# Patient Record
Sex: Female | Born: 1960
Health system: Southern US, Community
[De-identification: ages and names within clinical notes are randomized; demographics above are authoritative.]

## PROBLEM LIST (undated history)

## (undated) DIAGNOSIS — F319 Bipolar disorder, unspecified: Secondary | ICD-10-CM

## (undated) DIAGNOSIS — K209 Esophagitis, unspecified without bleeding: Secondary | ICD-10-CM

## (undated) DIAGNOSIS — B009 Herpesviral infection, unspecified: Secondary | ICD-10-CM

## (undated) DIAGNOSIS — G4700 Insomnia, unspecified: Secondary | ICD-10-CM

## (undated) DIAGNOSIS — K222 Esophageal obstruction: Secondary | ICD-10-CM

## (undated) DIAGNOSIS — K219 Gastro-esophageal reflux disease without esophagitis: Secondary | ICD-10-CM

## (undated) DIAGNOSIS — F308 Other manic episodes: Secondary | ICD-10-CM

## (undated) DIAGNOSIS — R7303 Prediabetes: Secondary | ICD-10-CM

## (undated) DIAGNOSIS — G4733 Obstructive sleep apnea (adult) (pediatric): Secondary | ICD-10-CM

## (undated) DIAGNOSIS — I700 Atherosclerosis of aorta: Secondary | ICD-10-CM

## (undated) DIAGNOSIS — F32A Depression, unspecified: Secondary | ICD-10-CM

## (undated) DIAGNOSIS — M810 Age-related osteoporosis without current pathological fracture: Secondary | ICD-10-CM

## (undated) DIAGNOSIS — F411 Generalized anxiety disorder: Secondary | ICD-10-CM

## (undated) DIAGNOSIS — R251 Tremor, unspecified: Secondary | ICD-10-CM

## (undated) DIAGNOSIS — R7611 Nonspecific reaction to tuberculin skin test without active tuberculosis: Secondary | ICD-10-CM

## (undated) DIAGNOSIS — E785 Hyperlipidemia, unspecified: Secondary | ICD-10-CM

## (undated) HISTORY — DX: Esophageal obstruction: K22.2

## (undated) HISTORY — DX: Hyperlipidemia, unspecified: E78.5

## (undated) HISTORY — PX: ROBOTIC ASSISTED LAPAROSCOPIC HYSTERECTOMY AND SALPINGECTOMY: SHX6379

## (undated) HISTORY — DX: Generalized anxiety disorder: F41.1

## (undated) HISTORY — DX: Bipolar disorder, unspecified: F31.9

## (undated) HISTORY — DX: Obstructive sleep apnea (adult) (pediatric): G47.33

## (undated) HISTORY — PX: ABDOMINAL HYSTERECTOMY: SHX81

## (undated) HISTORY — DX: Gastro-esophageal reflux disease without esophagitis: K21.9

## (undated) HISTORY — DX: Other manic episodes: F30.8

## (undated) HISTORY — PX: CATARACT EXTRACTION, BILATERAL: SHX1313

## (undated) HISTORY — DX: Nonspecific reaction to tuberculin skin test without active tuberculosis: R76.11

## (undated) HISTORY — DX: Herpesviral infection, unspecified: B00.9

## (undated) HISTORY — DX: Depression, unspecified: F32.A

## (undated) HISTORY — DX: Prediabetes: R73.03

## (undated) HISTORY — DX: Atherosclerosis of aorta: I70.0

## (undated) HISTORY — DX: Insomnia, unspecified: G47.00

## (undated) HISTORY — DX: Tremor, unspecified: R25.1

## (undated) HISTORY — DX: Age-related osteoporosis without current pathological fracture: M81.0

## (undated) HISTORY — DX: Esophagitis, unspecified without bleeding: K20.90

---

## 1998-08-25 ENCOUNTER — Emergency Department (HOSPITAL_COMMUNITY): Admission: EM | Admit: 1998-08-25 | Discharge: 1998-08-25 | Payer: Self-pay | Admitting: Emergency Medicine

## 1999-12-10 ENCOUNTER — Other Ambulatory Visit: Admission: RE | Admit: 1999-12-10 | Discharge: 1999-12-10 | Payer: Self-pay | Admitting: *Deleted

## 2000-01-14 ENCOUNTER — Encounter (INDEPENDENT_AMBULATORY_CARE_PROVIDER_SITE_OTHER): Payer: Self-pay

## 2000-01-14 ENCOUNTER — Ambulatory Visit (HOSPITAL_COMMUNITY): Admission: RE | Admit: 2000-01-14 | Discharge: 2000-01-14 | Payer: Self-pay | Admitting: Obstetrics and Gynecology

## 2000-01-15 ENCOUNTER — Observation Stay (HOSPITAL_COMMUNITY): Admission: AD | Admit: 2000-01-15 | Discharge: 2000-01-16 | Payer: Self-pay | Admitting: Obstetrics & Gynecology

## 2001-07-03 ENCOUNTER — Other Ambulatory Visit: Admission: RE | Admit: 2001-07-03 | Discharge: 2001-07-03 | Payer: Self-pay | Admitting: Obstetrics and Gynecology

## 2001-10-04 DIAGNOSIS — G4730 Sleep apnea, unspecified: Secondary | ICD-10-CM | POA: Insufficient documentation

## 2001-10-04 DIAGNOSIS — G4733 Obstructive sleep apnea (adult) (pediatric): Secondary | ICD-10-CM | POA: Insufficient documentation

## 2002-01-28 ENCOUNTER — Other Ambulatory Visit: Admission: RE | Admit: 2002-01-28 | Discharge: 2002-01-28 | Payer: Self-pay | Admitting: Obstetrics and Gynecology

## 2002-06-10 ENCOUNTER — Other Ambulatory Visit: Admission: RE | Admit: 2002-06-10 | Discharge: 2002-06-10 | Payer: Self-pay | Admitting: Obstetrics and Gynecology

## 2002-10-30 ENCOUNTER — Other Ambulatory Visit: Admission: RE | Admit: 2002-10-30 | Discharge: 2002-10-30 | Payer: Self-pay | Admitting: Obstetrics and Gynecology

## 2004-02-05 ENCOUNTER — Other Ambulatory Visit: Admission: RE | Admit: 2004-02-05 | Discharge: 2004-02-05 | Payer: Self-pay | Admitting: Family Medicine

## 2004-04-28 ENCOUNTER — Ambulatory Visit (HOSPITAL_COMMUNITY): Admission: RE | Admit: 2004-04-28 | Discharge: 2004-04-28 | Payer: Self-pay | Admitting: Obstetrics & Gynecology

## 2005-09-29 ENCOUNTER — Other Ambulatory Visit: Admission: RE | Admit: 2005-09-29 | Discharge: 2005-09-29 | Payer: Self-pay | Admitting: Family Medicine

## 2006-04-07 ENCOUNTER — Encounter (INDEPENDENT_AMBULATORY_CARE_PROVIDER_SITE_OTHER): Payer: Self-pay | Admitting: Specialist

## 2006-04-07 ENCOUNTER — Ambulatory Visit (HOSPITAL_COMMUNITY): Admission: RE | Admit: 2006-04-07 | Discharge: 2006-04-08 | Payer: Self-pay | Admitting: Obstetrics & Gynecology

## 2008-07-28 ENCOUNTER — Other Ambulatory Visit: Admission: RE | Admit: 2008-07-28 | Discharge: 2008-07-28 | Payer: Self-pay | Admitting: Family Medicine

## 2009-07-29 ENCOUNTER — Other Ambulatory Visit: Admission: RE | Admit: 2009-07-29 | Discharge: 2009-07-29 | Payer: Self-pay | Admitting: Family Medicine

## 2010-08-03 ENCOUNTER — Other Ambulatory Visit: Admission: RE | Admit: 2010-08-03 | Discharge: 2010-08-03 | Payer: Self-pay | Admitting: Family Medicine

## 2010-10-03 ENCOUNTER — Emergency Department (HOSPITAL_BASED_OUTPATIENT_CLINIC_OR_DEPARTMENT_OTHER)
Admission: EM | Admit: 2010-10-03 | Discharge: 2010-10-03 | Payer: Self-pay | Source: Home / Self Care | Admitting: Emergency Medicine

## 2011-01-04 LAB — BASIC METABOLIC PANEL
BUN: 15 mg/dL (ref 6–23)
CO2: 28 mEq/L (ref 19–32)
Chloride: 103 mEq/L (ref 96–112)
Creatinine, Ser: 0.8 mg/dL (ref 0.4–1.2)
GFR calc Af Amer: >60 mL/min (ref >60)
Glucose, Bld: 97 mg/dL (ref 70–99)
Potassium: 4.1 mEq/L (ref 3.5–5.1)
Sodium: 144 mEq/L (ref 135–145)

## 2011-01-04 LAB — POCT CARDIAC MARKERS
CKMB, poc: <1 ng/mL — ABNORMAL LOW (ref 1.0–8.0)
CKMB, poc: <1 ng/mL — ABNORMAL LOW (ref 1.0–8.0)
Myoglobin, poc: 47.8 ng/mL (ref 12–200)
Troponin i, poc: <0.05 ng/mL (ref 0.00–0.09)
Troponin i, poc: <0.05 ng/mL (ref 0.00–0.09)

## 2011-01-04 LAB — CBC
MCH: 30.9 pg (ref 26.0–34.0)
MCHC: 34.1 g/dL (ref 30.0–36.0)
Platelets: 265 10*3/uL (ref 150–400)
RBC: 4.31 MIL/uL (ref 3.87–5.11)
RDW: 12.3 % (ref 11.5–15.5)

## 2011-01-04 LAB — DIFFERENTIAL
Basophils Relative: 1 % (ref 0–1)
Monocytes Relative: 7 % (ref 3–12)

## 2011-01-06 ENCOUNTER — Other Ambulatory Visit (HOSPITAL_BASED_OUTPATIENT_CLINIC_OR_DEPARTMENT_OTHER): Payer: Self-pay | Admitting: Gastroenterology

## 2011-01-06 DIAGNOSIS — R1013 Epigastric pain: Secondary | ICD-10-CM

## 2011-01-10 ENCOUNTER — Ambulatory Visit (HOSPITAL_BASED_OUTPATIENT_CLINIC_OR_DEPARTMENT_OTHER)
Admission: RE | Admit: 2011-01-10 | Discharge: 2011-01-10 | Disposition: A | Discharge status: Home / Self Care | Payer: BC Managed Care – PPO | Source: Ambulatory Visit | Attending: Gastroenterology | Admitting: Gastroenterology

## 2011-01-10 DIAGNOSIS — R079 Chest pain, unspecified: Secondary | ICD-10-CM | POA: Insufficient documentation

## 2011-01-10 DIAGNOSIS — R1013 Epigastric pain: Secondary | ICD-10-CM | POA: Insufficient documentation

## 2011-03-11 NOTE — Op Note (Signed)
NAME:  [name], [name]                     ACCOUNT NO.:  [id number]   MEDICAL RECORD NO.:  [id number]                   PATIENT TYPE:  AMB   LOCATION:  SDC                                  FACILITY:  [facility]   PHYSICIAN:  Genia Del, M.D.             DATE OF BIRTH:  10-30-1960   DATE OF PROCEDURE:  04/28/2004  DATE OF DISCHARGE:                                 OPERATIVE REPORT   PREOPERATIVE DIAGNOSIS:  Persistent CIN-I with large ectropion.   POSTOPERATIVE DIAGNOSIS:  Persistent CIN-I with large ectropion.   PROCEDURE:  Colposcopy plus laser CO2 of cervix.   SURGEON:  Genia Del, M.D.   ANESTHESIOLOGIST:  Burnett Corrente, M.D.   DESCRIPTION OF PROCEDURE:  Under general anesthesia with endotracheal  intubation, the patient is in lithotomy position.  We start with colposcopy.  The vulva is inspected.  No lesion is seen at that level. The speculum is  inserted in the vagina.  We look at the vaginal mucosa which is normal in  appearance, then we concentrate on the cervix.  Acetic acid is applied.  A  large ectropion is seen. Mild Acetowhite areas are seen at 9 and 12 o'clock  which grew response to the areas biopsied on the colposcopy May 2005 which  showed CIN-I.  We then proceed with the CO2 laser vaporization.  Wet towels  are applied on the suprapubic and perivulvar areas.  The speculum used is  galvanized.  We used the CO2 laser to vaporize the transitional zone  entirely including the old ectropion where the cervix appears normal on the  ectropion or on the transitional zone.  The department goes to about 3 mm.  We go deeper at 9 o'clock and 12 o'clock to 5 to 7 mm.  We used the maximum  intensity of 30, then lower it to 15 for hemostasis.  Good hemostasis is  attained.  Monsel's is added as well as silver nitrate to complete it.  All  instruments are then removed.  The estimated blood loss was minimal.  No  complications occurred and the patient was transferred  to the recovery room  in good condition.                                               Genia Del, M.D.    ML/MEDQ  D:  04/28/2004  T:  04/28/2004  Job:  [id number]

## 2011-03-11 NOTE — Op Note (Signed)
[facility]  Patient:    [name]                     MRN: [id number] Proc. Date: 01/14/00 Adm. Date:  [id number] Attending:  Morene Antu                           Operative Report  PREOPERATIVE DIAGNOSIS:       Menorrhagia, thickened endometrium.  POSTOPERATIVE DIAGNOSIS:      Menorrhagia, thickened endometrium.  Submucosal fibroids.  OPERATION:  SURGEON:                      Sherry A. Rosalio Macadamia, M.D.  ASSISTANT:  ANESTHESIA:                   General anesthesia.  ESTIMATED BLOOD LOSS:  INDICATIONS:                  This is a 50 year old, gravida 2, para 2-0-0-2, woman who has been having excessively heavy menstrual periods every month lasting five to six days.  Because of the excessive flow and an enlarged uterus on examination, the patient underwent ultrasound.  Ultrasound revealed a uterus with small fibroids  present with echogenic masses present within the endometrial cavity.  Because of this thickening, the patient was brought to the operating room for a D&C, hysteroscopy, with resectoscope.  FINDINGS:                     9 weeks size anteflexed uterus.  Adnexa without mass. Submucosal fibroids present.  DESCRIPTION OF PROCEDURE:     The patient was brought into the operating room and given adequate general anesthesia with an ______ tube.  The patient was placed n the dorsal lithotomy position.  Her perineum and vagina were washed with Betadine. The bladder was in-and-out catheterized.  Pelvic examination was performed. The patient was then draped in a sterile fashion.  Speculum was placed within the vagina.  Paracervical block was administered with 1% Nesocaine.  Anterior lip of the cervix was grasped with a single tooth tenaculum.  The cervix was sounded. The cervix was dilated with Pratt dilators to a #31.  Hysteroscope was easily introduced into the endometrial cavity.  Pictures were obtained.  Using  a right  angle double loupe resector at 190 watts of power, the endometrium was resected in sheets including removing the submucosal fibroids.  Once this was done circumferentially and adequate hemostasis was obtained, picture was obtained. ll instruments were then removed from the vagina.  The patient was taken out of the dorsal lithotomy position.  She was awakened.  She was removed from the operating table to a stretcher in stable condition.  Complications were none.  Estimated blood loss was less than 5 cc.  Sorbitol differential -100. DD:  01/14/00 TD:  01/14/00 Job: [id number]

## 2011-03-11 NOTE — Op Note (Signed)
[name], [name]           ACCOUNT NO.:  [id number]   MEDICAL RECORD NO.:  [id number]          PATIENT TYPE:  AMB   LOCATION:  DAY                          FACILITY:  [facility]   PHYSICIAN:  Genia Del, M.D.DATE OF BIRTH:  1961/02/03   DATE OF PROCEDURE:  04/07/2006  DATE OF DISCHARGE:                                 OPERATIVE REPORT   PREOPERATIVE DIAGNOSIS:  Symptomatic uterine myomas with menorrhagia.   POSTOPERATIVE DIAGNOSIS:  Symptomatic uterine myomas with menorrhagia.   INTERVENTION:  Laparoscopically-assisted vaginal hysterectomy plus bilateral  salpingo-oophorectomy assisted with da Vinci robot.   SURGEON:  Genia Del, M.D.   ASSISTANT:  Pershing Cox, M.D.   ANESTHESIOLOGIST:  Dr. Audie Box.   PROCEDURE:  Under general anesthesia with endotracheal intubation, the  patient is in lithotomy position for operative laparoscopy.  She is prepped  with Betadine on the abdominal, suprapubic, vulvar and vaginal areas.  The  bladder catheter is inserted and the patient is draped as usual.  A vaginal  exam is done under anesthesia.  We then put in place the RUMI with a co-  ring.  The co-ring is attached to the cervix with a Vicryl 0.  We then put  the port in place.  The measurement is done between the symphysis pubis and  the supraumbilical area.  The camera port is inserted at 20 cm from the  symphysis pubis.  Infiltration of Marcaine 0.25% plain at all incision sites  and incisions are made with scalpel.  We start with the Hasson, which is  inserted under direct vision.  We insert the camera at that site and a  pneumoperitoneum is created.  We then measure the site with all other ports  and they are put in place as usual with the two robotic arms at 8 cm,  slightly down from the camera, and the fourth arm on the left side and the  assistant arm up on the right between the camera and the second arm of the  robot.  We put the instruments in place, the  shear scissors on the right  robotic arm and a [place] on the left.  The tenaculum is put on the fourth  arm.  We are ready for robotic time.  We start on the left side.  We  successively cauterize and cut the left infundibulopelvic ligament, the left  round ligament, and follow close to the uterus down to the left uterine  artery.  The left ureter was well-identified before doing so and was in  normal anatomic position.  We also open the anterior visceral peritoneum and  lower the bladder anteriorly.  We proceed exactly the same way on the right  side.  The fourth arm is used to help position the uterus and have that  exposure.  We then go circumferentially around the superior vaginal wall  with the point of the scissors and open the vagina using the co-ring as a  guide.  We detach the uterus completely this way.  The uterus and ovaries  are removed and sent to pathology.  We then inflate the balloon to keep the  pneumoperitoneum.  The balloon is placed about mid-vagina.  We then change  instruments for the needle holders and a fenestrated bipolar in the fourth  arm.  We use Vicryl 0 on a CT1 to do two half-running sutures to close the  vagina.  Hemostasis is adequate at all levels.  Irrigation and suction is  done and the count of instruments and sponges was complete.  All instruments  were removed.  Trocars were removed after evacuating the CO2 and the robot  was undocked.  We then put the patient out of deep Trendelenburg and  proceeded with closure of the incisions.  The supraumbilical incision and  the assistant port were closed with a subcuticular stitch of Vicryl 4-0 at  the skin.  The supraumbilical incision was also closed at the aponeurosis  with a pursestring suture that was already  in place.  We then put one stitch of Vicryl 0 subcuticularly at all other  incisions and added Dermabond at those levels.  All incisions are well-  closed and hemostatic.  The estimated blood loss  was less than 100 mL.  No  complication occurred, and the patient was brought to the recovery room in  good status.      Genia Del, M.D.  Electronically Signed     ML/MEDQ  D:  04/07/2006  T:  04/07/2006  Job:  [id number]

## 2011-03-24 ENCOUNTER — Other Ambulatory Visit: Payer: Self-pay | Admitting: Gastroenterology

## 2011-12-21 ENCOUNTER — Other Ambulatory Visit (HOSPITAL_COMMUNITY): Payer: Self-pay | Admitting: Family Medicine

## 2011-12-21 DIAGNOSIS — Z1231 Encounter for screening mammogram for malignant neoplasm of breast: Secondary | ICD-10-CM

## 2012-01-13 ENCOUNTER — Ambulatory Visit (HOSPITAL_COMMUNITY)
Admission: RE | Admit: 2012-01-13 | Discharge: 2012-01-13 | Disposition: A | Discharge status: Home / Self Care | Payer: Self-pay | Source: Ambulatory Visit | Attending: Family Medicine | Admitting: Family Medicine

## 2012-01-13 DIAGNOSIS — Z1231 Encounter for screening mammogram for malignant neoplasm of breast: Secondary | ICD-10-CM

## 2012-12-04 ENCOUNTER — Other Ambulatory Visit (HOSPITAL_BASED_OUTPATIENT_CLINIC_OR_DEPARTMENT_OTHER): Payer: Self-pay | Admitting: Family Medicine

## 2012-12-04 DIAGNOSIS — Z1231 Encounter for screening mammogram for malignant neoplasm of breast: Secondary | ICD-10-CM

## 2013-01-14 ENCOUNTER — Ambulatory Visit (HOSPITAL_BASED_OUTPATIENT_CLINIC_OR_DEPARTMENT_OTHER)
Admission: RE | Admit: 2013-01-14 | Discharge: 2013-01-14 | Disposition: A | Discharge status: Home / Self Care | Payer: BC Managed Care – PPO | Source: Ambulatory Visit | Attending: Family Medicine | Admitting: Family Medicine

## 2013-01-14 DIAGNOSIS — Z1231 Encounter for screening mammogram for malignant neoplasm of breast: Secondary | ICD-10-CM | POA: Insufficient documentation

## 2013-12-12 ENCOUNTER — Other Ambulatory Visit (HOSPITAL_BASED_OUTPATIENT_CLINIC_OR_DEPARTMENT_OTHER): Payer: Self-pay | Admitting: Family Medicine

## 2013-12-12 DIAGNOSIS — Z1231 Encounter for screening mammogram for malignant neoplasm of breast: Secondary | ICD-10-CM

## 2014-01-13 ENCOUNTER — Encounter: Payer: Self-pay | Admitting: Neurology

## 2014-01-15 ENCOUNTER — Ambulatory Visit (HOSPITAL_BASED_OUTPATIENT_CLINIC_OR_DEPARTMENT_OTHER)
Admission: RE | Admit: 2014-01-15 | Discharge: 2014-01-15 | Disposition: A | Discharge status: Home / Self Care | Payer: BC Managed Care – PPO | Source: Ambulatory Visit | Attending: Family Medicine | Admitting: Family Medicine

## 2014-01-15 DIAGNOSIS — Z1231 Encounter for screening mammogram for malignant neoplasm of breast: Secondary | ICD-10-CM | POA: Insufficient documentation

## 2014-01-16 ENCOUNTER — Ambulatory Visit (INDEPENDENT_AMBULATORY_CARE_PROVIDER_SITE_OTHER): Payer: BC Managed Care – PPO | Admitting: Neurology

## 2014-01-16 ENCOUNTER — Encounter: Payer: Self-pay | Admitting: Neurology

## 2014-01-16 VITALS — BP 110/70 | HR 68 | Temp 97.9°F | Ht 65.0 in | Wt 156.0 lb

## 2014-01-16 DIAGNOSIS — G250 Essential tremor: Secondary | ICD-10-CM

## 2014-01-16 DIAGNOSIS — G252 Other specified forms of tremor: Secondary | ICD-10-CM

## 2014-01-16 DIAGNOSIS — G219 Secondary parkinsonism, unspecified: Secondary | ICD-10-CM

## 2014-01-16 DIAGNOSIS — F3130 Bipolar disorder, current episode depressed, mild or moderate severity, unspecified: Secondary | ICD-10-CM

## 2014-01-16 DIAGNOSIS — G212 Secondary parkinsonism due to other external agents: Secondary | ICD-10-CM

## 2014-01-16 DIAGNOSIS — F319 Bipolar disorder, unspecified: Secondary | ICD-10-CM

## 2014-01-16 DIAGNOSIS — T438X5A Adverse effect of other psychotropic drugs, initial encounter: Secondary | ICD-10-CM

## 2014-01-16 DIAGNOSIS — G251 Drug-induced tremor: Secondary | ICD-10-CM

## 2014-01-16 NOTE — Progress Notes (Signed)
Office note faxed to Nira Retortatherine Greene at [phone], confirmation received.

## 2014-01-16 NOTE — Progress Notes (Signed)
Subjective:    [name] was seen in consultation in the movement disorder clinic at the request of Astrid Divine, MD.  Her psychiatrist is Dr. Alcide Evener.  The evaluation is for tremor.  The patient is a 53 y.o. right handed female with a history of tremor.  Pt reports that she has had tremor for over a decade, and her husband has always thought that they were due to medication.  She recently went to a counselor who told her that she shouldn't blame this all on medication as her maternal uncle has PD and her mom has tremor of unknown origin.  Pt states that initially she just had hand tremor but over the last few years, she has noted it in the head.  She notes the tremor most when she is writing and eating soup.  She has trouble putting mascara on and has to stabilize her arm on a glass.  Pt has a dx of bipolar disorder and is maintained on lithium (on since nov), seroquel (on for 15+ years)and lamictal (for 10 years).  She had ECT in May and had 5-6 treatments.  She is clear to state that tremor proceeded the lithium although it has gotten worse since the lithium.  Affected by caffeine:  no but drinks 3 cans of dr. Reino Kent per day Affected by alcohol:  Unknown, doesn't drink alcohol Affected by stress:  yes Affected by fatigue:  no Spills soup if on spoon:  yes Spills glass of liquid if full:  yes (has to use a travel mug) Affects ADL's (tying shoes, brushing teeth, etc):  yes  Current/Previously tried tremor medications: n/a  Current medications that may exacerbate tremor:  n/a  Outside reports reviewed: historical medical records and referral letter/letters.  Allergies  Allergen Reactions  . Prilosec [Omeprazole]     Interacts with psych meds  . Tylox [Oxycodone-Acetaminophen] Rash    Current Outpatient Prescriptions on File Prior to Visit  Medication Sig Dispense Refill  . lamoTRIgine (LAMICTAL) 200 MG tablet Take 200 mg by mouth daily.      Marland Kitchen lithium  carbonate (ESKALITH) 450 MG CR tablet Take by mouth 2 (two) times daily.      . Multiple Vitamin (MULTIVITAMIN) tablet Take 1 tablet by mouth daily.      . Omega-3 Fatty Acids (OMEGA 3 PO) Take 1,200 mg by mouth daily.      . ondansetron (ZOFRAN) 4 MG tablet Take 4 mg by mouth every 8 (eight) hours as needed for nausea or vomiting.      Marland Kitchen QUEtiapine (SEROQUEL) 300 MG tablet Take 300 mg by mouth at bedtime.      . simvastatin (ZOCOR) 40 MG tablet Take 40 mg by mouth daily.       No current facility-administered medications on file prior to visit.    Past Medical History  Diagnosis Date  . Bipolar 1 disorder   . PPD positive   . Hyperlipidemia   . OSA (obstructive sleep apnea)     CPAP dependent    Past Surgical History  Procedure Laterality Date  . Robotic assisted laparoscopic hysterectomy and salpingectomy      History   Social History  . Marital Status: Married    Spouse Name: N/A    Number of Children: N/A  . Years of Education: N/A   Occupational History  .      unemployed   Social History Main Topics  . Smoking status: Never Smoker   . Smokeless tobacco:  Not on file  . Alcohol Use: No  . Drug Use: No  . Sexual Activity: Not on file   Other Topics Concern  . Not on file   Social History Narrative  . No narrative on file    Family Status  Relation Status Death Age  . Mother Alive     tremor, OA  . Father Alive     AAA  . Brother Alive     healthy  . Child Alive     healthy    Review of Systems A complete 10 system ROS was obtained and was negative apart from what is mentioned.   Objective:   VITALS:   Filed Vitals:   01/16/14 0812  BP: 110/70  Pulse: 68  Temp: 97.9 F (36.6 C)  TempSrc: Oral  Height: 5\' 5"  (1.651 m)  Weight: 156 lb (70.761 kg)   Gen:  Appears stated age and in NAD. HEENT:  Normocephalic, atraumatic. The mucous membranes are moist. The superficial temporal arteries are without ropiness or tenderness. Cardiovascular:  Regular rate and rhythm. Lungs: Clear to auscultation bilaterally. Neck: There are no carotid bruits noted bilaterally.  NEUROLOGICAL:  Orientation:  The patient is alert and oriented x 3.  Recent and remote memory are intact.  Attention span and concentration are normal.  Able to name objects and repeat without trouble.  Fund of knowledge is appropriate Cranial nerves: There is good facial symmetry. The pupils are equal round and reactive to light bilaterally. Fundoscopic exam reveals clear disc margins bilaterally. Extraocular muscles are intact and visual fields are full to confrontational testing. Speech is fluent and clear. Soft palate rises symmetrically and there is no tongue deviation. Hearing is intact to conversational tone. Tone: Tone is good throughout.  No rigidity Sensation: Sensation is intact to light touch and pinprick throughout (facial, trunk, extremities). Vibration is intact at the bilateral big toe. There is no extinction with double simultaneous stimulation. There is no sensory dermatomal level identified. Coordination:  The patient has no dysdiadichokinesia or dysmetria. Motor: Strength is 5/5 in the bilateral upper and lower extremities.  Shoulder shrug is equal bilaterally.  There is no pronator drift.  There are no fasciculations noted. DTR's: Deep tendon reflexes are 2/4 at the bilateral biceps, triceps, brachioradialis, patella and achilles.  Long after tapping on the reflex, however, she will have a nonphysiologic jerking of that extremity.  Plantar responses are downgoing bilaterally. Gait and Station: She gets out of the chair without the use of her hands without troubleThe patient is able to ambulate without difficulty. The patient has mild trouble when asked to ambulate in a tandem fashion. The patient is able to stand in the Romberg position.   MOVEMENT EXAM: Tremor:  There is tremor in the UE, noted most significantly with action and sustained posture.  She does  have mild rest tremor of the hands, R > L .  She has some trouble with Archimedes spirals.  There is head titubation, irregular in frequency.  LABS:  Reviewed 09/05/13 labs from PCP.  AST 21, ALT 28, glu 111, hgbA1C 5.8     Assessment/Plan:   1.   Tremor.  -I think that tremor increased recently due to the addition of lithium.  However, she is clear in stating that tremor preceeded lithium.  Although seroquel is less likely to produce parkinsonism than the other atypicals, there is still some D2 receptor blockade and I suspect that this is the cause of her sx's.  She is  on a fairly high dose of seroquel and reports greater than 15 year use.  We did talk about the fact that clozaril could be an alternative but would require weekly blood monitoring, but even if her psychiatrist felt that would be an appropriate treatment, she states that she could not tolerate the blood monitoring.  She is going to talk about the lithium with her psychiatrist.    -At this point, I see no evidence of idiopathic PD.  -I would be happy to see her back in the future if needed.  If not already done, I would recommend her tsh be checked.

## 2014-12-05 ENCOUNTER — Other Ambulatory Visit (HOSPITAL_BASED_OUTPATIENT_CLINIC_OR_DEPARTMENT_OTHER): Payer: Self-pay | Admitting: Family Medicine

## 2014-12-05 DIAGNOSIS — Z1231 Encounter for screening mammogram for malignant neoplasm of breast: Secondary | ICD-10-CM

## 2014-12-25 DIAGNOSIS — F411 Generalized anxiety disorder: Secondary | ICD-10-CM | POA: Insufficient documentation

## 2014-12-25 DIAGNOSIS — F319 Bipolar disorder, unspecified: Secondary | ICD-10-CM | POA: Insufficient documentation

## 2015-01-09 DIAGNOSIS — Z8619 Personal history of other infectious and parasitic diseases: Secondary | ICD-10-CM | POA: Insufficient documentation

## 2015-01-09 DIAGNOSIS — IMO0001 Reserved for inherently not codable concepts without codable children: Secondary | ICD-10-CM | POA: Insufficient documentation

## 2015-01-09 DIAGNOSIS — E785 Hyperlipidemia, unspecified: Secondary | ICD-10-CM | POA: Insufficient documentation

## 2015-01-19 ENCOUNTER — Ambulatory Visit (HOSPITAL_BASED_OUTPATIENT_CLINIC_OR_DEPARTMENT_OTHER): Payer: Self-pay

## 2015-01-29 DIAGNOSIS — Z9889 Other specified postprocedural states: Secondary | ICD-10-CM | POA: Insufficient documentation

## 2015-03-24 ENCOUNTER — Ambulatory Visit (HOSPITAL_BASED_OUTPATIENT_CLINIC_OR_DEPARTMENT_OTHER)
Admission: RE | Admit: 2015-03-24 | Discharge: 2015-03-24 | Disposition: A | Discharge status: Home / Self Care | Payer: Medicare Other | Source: Ambulatory Visit | Attending: Family Medicine | Admitting: Family Medicine

## 2015-03-24 DIAGNOSIS — Z1231 Encounter for screening mammogram for malignant neoplasm of breast: Secondary | ICD-10-CM | POA: Insufficient documentation

## 2015-12-01 DIAGNOSIS — F3181 Bipolar II disorder: Secondary | ICD-10-CM | POA: Diagnosis not present

## 2016-01-26 DIAGNOSIS — F3181 Bipolar II disorder: Secondary | ICD-10-CM | POA: Diagnosis not present

## 2016-02-22 DIAGNOSIS — G4733 Obstructive sleep apnea (adult) (pediatric): Secondary | ICD-10-CM | POA: Diagnosis not present

## 2016-03-01 DIAGNOSIS — F3181 Bipolar II disorder: Secondary | ICD-10-CM | POA: Diagnosis not present

## 2016-05-03 DIAGNOSIS — F3181 Bipolar II disorder: Secondary | ICD-10-CM | POA: Diagnosis not present

## 2016-05-03 DIAGNOSIS — F411 Generalized anxiety disorder: Secondary | ICD-10-CM | POA: Diagnosis not present

## 2016-05-04 DIAGNOSIS — R197 Diarrhea, unspecified: Secondary | ICD-10-CM | POA: Diagnosis not present

## 2016-05-06 DIAGNOSIS — R197 Diarrhea, unspecified: Secondary | ICD-10-CM | POA: Diagnosis not present

## 2016-05-20 ENCOUNTER — Encounter (HOSPITAL_BASED_OUTPATIENT_CLINIC_OR_DEPARTMENT_OTHER): Payer: Self-pay | Admitting: Respiratory Therapy

## 2016-05-20 ENCOUNTER — Emergency Department (HOSPITAL_BASED_OUTPATIENT_CLINIC_OR_DEPARTMENT_OTHER)
Admission: EM | Admit: 2016-05-20 | Discharge: 2016-05-20 | Disposition: A | Discharge status: Home / Self Care | Payer: Medicare Other | Attending: Emergency Medicine | Admitting: Emergency Medicine

## 2016-05-20 DIAGNOSIS — E860 Dehydration: Secondary | ICD-10-CM | POA: Insufficient documentation

## 2016-05-20 DIAGNOSIS — R55 Syncope and collapse: Secondary | ICD-10-CM | POA: Diagnosis not present

## 2016-05-20 DIAGNOSIS — Z79899 Other long term (current) drug therapy: Secondary | ICD-10-CM | POA: Insufficient documentation

## 2016-05-20 DIAGNOSIS — A047 Enterocolitis due to Clostridium difficile: Secondary | ICD-10-CM | POA: Diagnosis not present

## 2016-05-20 DIAGNOSIS — F319 Bipolar disorder, unspecified: Secondary | ICD-10-CM | POA: Insufficient documentation

## 2016-05-20 DIAGNOSIS — A0472 Enterocolitis due to Clostridium difficile, not specified as recurrent: Secondary | ICD-10-CM

## 2016-05-20 DIAGNOSIS — E785 Hyperlipidemia, unspecified: Secondary | ICD-10-CM | POA: Insufficient documentation

## 2016-05-20 LAB — URINALYSIS, ROUTINE W REFLEX MICROSCOPIC
BILIRUBIN URINE: NEGATIVE
Glucose, UA: NEGATIVE mg/dL
Hgb urine dipstick: NEGATIVE
Ketones, ur: 15 mg/dL — AB
NITRITE: NEGATIVE
PH: 5 (ref 5.0–8.0)
Protein, ur: NEGATIVE mg/dL
SPECIFIC GRAVITY, URINE: 1.018 (ref 1.005–1.030)

## 2016-05-20 LAB — I-STAT CG4 LACTIC ACID, ED: LACTIC ACID, VENOUS: 1.43 mmol/L (ref 0.5–1.9)

## 2016-05-20 LAB — CBC WITH DIFFERENTIAL/PLATELET
Basophils Absolute: 0 10*3/uL (ref 0.0–0.1)
Basophils Relative: 0 %
Eosinophils Absolute: 0 10*3/uL (ref 0.0–0.7)
Eosinophils Relative: 0 %
HEMATOCRIT: 40.6 % (ref 36.0–46.0)
HEMOGLOBIN: 13.8 g/dL (ref 12.0–15.0)
LYMPHS ABS: 0.9 10*3/uL (ref 0.7–4.0)
LYMPHS PCT: 10 %
MCH: 30.8 pg (ref 26.0–34.0)
MCHC: 34 g/dL (ref 30.0–36.0)
MCV: 90.6 fL (ref 78.0–100.0)
MONOS PCT: 4 %
Monocytes Absolute: 0.3 10*3/uL (ref 0.1–1.0)
NEUTROS ABS: 8 10*3/uL — AB (ref 1.7–7.7)
NEUTROS PCT: 86 %
Platelets: 207 10*3/uL (ref 150–400)
RBC: 4.48 MIL/uL (ref 3.87–5.11)
RDW: 12.5 % (ref 11.5–15.5)
WBC: 9.2 10*3/uL (ref 4.0–10.5)

## 2016-05-20 LAB — C DIFFICILE QUICK SCREEN W PCR REFLEX
C DIFFICLE (CDIFF) ANTIGEN: POSITIVE — AB
C Diff interpretation: DETECTED
C Diff toxin: POSITIVE — AB

## 2016-05-20 LAB — COMPREHENSIVE METABOLIC PANEL
ALT: 32 U/L (ref 14–54)
AST: 25 U/L (ref 15–41)
Albumin: 4.2 g/dL (ref 3.5–5.0)
Alkaline Phosphatase: 68 U/L (ref 38–126)
Anion gap: 9 (ref 5–15)
BUN: 14 mg/dL (ref 6–20)
CHLORIDE: 103 mmol/L (ref 101–111)
CO2: 26 mmol/L (ref 22–32)
CREATININE: 0.75 mg/dL (ref 0.44–1.00)
Calcium: 9.3 mg/dL (ref 8.9–10.3)
Glucose, Bld: 126 mg/dL — ABNORMAL HIGH (ref 65–99)
POTASSIUM: 4 mmol/L (ref 3.5–5.1)
Sodium: 138 mmol/L (ref 135–145)
Total Bilirubin: 1.6 mg/dL — ABNORMAL HIGH (ref 0.3–1.2)
Total Protein: 7 g/dL (ref 6.5–8.1)

## 2016-05-20 LAB — URINE MICROSCOPIC-ADD ON

## 2016-05-20 MED ORDER — VANCOMYCIN HCL 125 MG PO CAPS: 125.0000 mg | ORAL_CAPSULE | Freq: Four times a day (QID) | ORAL | 0 refills | Status: AC

## 2016-05-20 MED ORDER — ONDANSETRON HCL 4 MG/2ML IJ SOLN
4.0000 mg | Freq: Four times a day (QID) | INTRAMUSCULAR | Status: DC | PRN
  Administered 2016-05-20: 4 mg via INTRAVENOUS
  Filled 2016-05-20: qty 2

## 2016-05-20 MED ORDER — SODIUM CHLORIDE 0.9 % IV BOLUS (SEPSIS)
1000.0000 mL | Freq: Once | INTRAVENOUS | Status: AC
  Administered 2016-05-20: 1000 mL via INTRAVENOUS

## 2016-05-20 MED ORDER — IBUPROFEN 800 MG PO TABS
800.0000 mg | ORAL_TABLET | Freq: Once | ORAL | Status: AC
  Administered 2016-05-20: 800 mg via ORAL
  Filled 2016-05-20: qty 1

## 2016-05-20 MED ORDER — ONDANSETRON 4 MG PO TBDP: 4.0000 mg | ORAL_TABLET | Freq: Three times a day (TID) | ORAL | 0 refills | Status: DC | PRN

## 2016-05-20 NOTE — ED Provider Notes (Signed)
[facility] Provider Note   CSN: [id number] Arrival date & time: 05/20/16  1219  First Provider Contact:  First MD Initiated Contact with Patient 05/20/16 1244        History   Chief Complaint Chief Complaint  Patient presents with  . Loss of Consciousness    HPI [name] is a 55 y.o. female.  The patient is a 55 year old female, prior history of hysterectomy secondary to heavy vaginal bleeding and anemia. This was many years ago. She also has a history of Clostridium difficile colitis, she approximately 3 weeks ago developed recurrent diarrhea, this was watery, profuse, associated with abdominal pain. She saw her gastroenterologist in [place] who placed her on Cipro and Flagyl and after 10 days of treatment she successfully improved and was having loose but frequent stools and minimal abdominal discomfort. This morning after using the bathroom and having a bowel movement which was very watery she developed severe abdominal pain then had a syncopal episode while on the commode. Her husband returned from the store to find her on the ground in the bathroom pale diaphoretic and ill appearing. The patient has no recollection of the actual syncopal episode, she still has some abdominal pain.    Loss of Consciousness      Past Medical History:  Diagnosis Date  . Bipolar 1 disorder (HCC)   . Hyperlipidemia   . OSA (obstructive sleep apnea)    CPAP dependent  . PPD positive     There are no active problems to display for this patient.   Past Surgical History:  Procedure Laterality Date  . CATARACT EXTRACTION, BILATERAL     due to seroquel  . ROBOTIC ASSISTED LAPAROSCOPIC HYSTERECTOMY AND SALPINGECTOMY      OB History    No data available       Home Medications    Prior to Admission medications   Medication Sig Start Date End Date Taking? Authorizing Provider  ARIPiprazole (ABILIFY) 2 MG tablet Take 2 mg by mouth daily.   Yes Historical  Provider, MD  Multiple Vitamin (MULTIVITAMIN) tablet Take 1 tablet by mouth daily.   Yes Historical Provider, MD  Omega-3 Fatty Acids (OMEGA 3 PO) Take 1,200 mg by mouth daily.   Yes Historical Provider, MD  ondansetron (ZOFRAN) 4 MG tablet Take 4 mg by mouth every 8 (eight) hours as needed for nausea or vomiting.   Yes Historical Provider, MD  Promethazine HCl (PHENERGAN PO) Take by mouth.   Yes Historical Provider, MD  propranolol (INDERAL) 20 MG tablet Take 20 mg by mouth 3 (three) times daily.   Yes Historical Provider, MD  QUEtiapine (SEROQUEL) 300 MG tablet Take 300 mg by mouth at bedtime.   Yes Historical Provider, MD  simvastatin (ZOCOR) 40 MG tablet Take 40 mg by mouth daily.   Yes Historical Provider, MD  lamoTRIgine (LAMICTAL) 200 MG tablet Take 200 mg by mouth daily.    Historical Provider, MD  lithium carbonate (ESKALITH) 450 MG CR tablet Take by mouth 2 (two) times daily.    Historical Provider, MD    Family History No family history on file.  Social History Social History  Substance Use Topics  . Smoking status: Never Smoker  . Smokeless tobacco: Never Used  . Alcohol use No     Allergies   Prilosec [omeprazole] and Tylox [oxycodone-acetaminophen]   Review of Systems Review of Systems  Cardiovascular: Positive for syncope.  All other systems reviewed and are negative.  Physical Exam Updated Vital Signs BP (!) 85/59   Pulse 68   Temp 98.1 F (36.7 C) (Oral)   Resp 16   Ht 5\' 5"  (1.651 m)   Wt 140 lb (63.5 kg)   SpO2 98%   BMI 23.30 kg/m   Physical Exam  Constitutional: She appears well-developed and well-nourished. No distress.  HENT:  Head: Normocephalic and atraumatic.  Mouth/Throat: Oropharynx is clear and moist. No oropharyngeal exudate.  Eyes: Conjunctivae and EOM are normal. Pupils are equal, round, and reactive to light. Right eye exhibits no discharge. Left eye exhibits no discharge. No scleral icterus.  Neck: Normal range of motion. Neck  supple. No JVD present. No thyromegaly present.  Cardiovascular: Normal rate, regular rhythm, normal heart sounds and intact distal pulses.  Exam reveals no gallop and no friction rub.   No murmur heard. Pulmonary/Chest: Effort normal and breath sounds normal. No respiratory distress. She has no wheezes. She has no rales.  Abdominal: Soft. Bowel sounds are normal. She exhibits no distension and no mass. There is no tenderness.  The abdominal exam is significant for normal bowel sounds, very diffusely soft abdomen without guarding or masses, mild tenderness diffusely  Musculoskeletal: Normal range of motion. She exhibits no edema or tenderness.  Lymphadenopathy:    She has no cervical adenopathy.  Neurological: She is alert. Coordination normal.  Skin: Skin is warm and dry. No rash noted. No erythema.  Psychiatric: She has a normal mood and affect. Her behavior is normal.  Nursing note and vitals reviewed.    ED Treatments / Results  Labs (all labs ordered are listed, but only abnormal results are displayed) Labs Reviewed  C DIFFICILE QUICK SCREEN W PCR REFLEX  COMPREHENSIVE METABOLIC PANEL  CBC WITH DIFFERENTIAL/PLATELET  LACTIC ACID, PLASMA  URINALYSIS, ROUTINE W REFLEX MICROSCOPIC (NOT AT [facility])    EKG  EKG Interpretation None       Radiology No results found.  Procedures Procedures (including critical care time)  Medications Ordered in ED Medications  sodium chloride 0.9 % bolus 1,000 mL (not administered)  sodium chloride 0.9 % bolus 1,000 mL (not administered)     Initial Impression / Assessment and Plan / ED Course  I have reviewed the triage vital signs and the nursing notes.  Pertinent labs & imaging results that were available during my care of the patient were reviewed by me and considered in my medical decision making (see chart for details).  Clinical Course  Comment By Time  The patient was updated on her clinical course. She has a positive C.  difficile sample, no leukocytosis, vital signs remain without tachycardia and with a blood pressure above 100. She appears stable for discharge, she is tolerating liquids. She now tells me that she did not have anything to eat today prior to this occurring, she has been relatively dehydrated and was having some abdominal discomfort. On repeat exam she still has a nontender abdomen. I discussed with the patient and her spouse regarding treatment options including admission versus discharge, we have been able to obtain a complete 10 day course of vancomycin for her prior to discharge. I was unable to get a hold of her gastroenterologist, left a message with staff at his office Eber Hong, MD 07/28 1639    Overall the patient at this time is well-appearing, her blood pressure is over 100 systolic, she is not tachycardic, I suspect that she is dehydrated and possibly has electrolyte imbalance. Her abdominal pain was severe this  morning however on exam has no guarding or significant signs of abdominal pathology. We'll obtain labs, orthostatics, intravenous fluids and repeat evaluation.  Able to obtain Vancomycin from pharmacy at this location for pt to start taking immediately - pt still having diarrhea but I suspect this will continue for day - she is tolerating PO and well enough for d/c.  Pt and spouse in agreement.  Final Clinical Impressions(s) / ED Diagnoses   Final diagnoses:  C. difficile colitis  Syncope, unspecified syncope type  Dehydration    New Prescriptions New Prescriptions   No medications on file     Eber Hong, MD 05/21/16 [phone]

## 2016-05-20 NOTE — ED Notes (Signed)
Pt on automatic VS and pulse ox, as well as cardiac monitor

## 2016-05-20 NOTE — ED Triage Notes (Addendum)
She has had a recent GI bug. Her husband found her passed out this am. She is weak, dusky colored, c.o abdominal pain. She just completed Cipro and Flagyl GI bug. Husband states she has a hx of cdiff. Her MD checked her for c diff 2 weeks ago and the test was negative. She had diarrhea this am.

## 2016-05-20 NOTE — Discharge Instructions (Signed)

## 2016-05-24 DIAGNOSIS — A047 Enterocolitis due to Clostridium difficile: Secondary | ICD-10-CM | POA: Diagnosis not present

## 2016-06-07 ENCOUNTER — Ambulatory Visit
Admission: RE | Admit: 2016-06-07 | Discharge: 2016-06-07 | Disposition: A | Discharge status: Home / Self Care | Payer: Medicare Other | Source: Ambulatory Visit | Attending: Physician Assistant | Admitting: Physician Assistant

## 2016-06-07 ENCOUNTER — Other Ambulatory Visit: Payer: Self-pay | Admitting: Physician Assistant

## 2016-06-07 DIAGNOSIS — R52 Pain, unspecified: Secondary | ICD-10-CM

## 2016-06-07 DIAGNOSIS — M25512 Pain in left shoulder: Secondary | ICD-10-CM | POA: Diagnosis not present

## 2016-06-07 DIAGNOSIS — M25519 Pain in unspecified shoulder: Secondary | ICD-10-CM | POA: Diagnosis not present

## 2016-06-22 DIAGNOSIS — F3181 Bipolar II disorder: Secondary | ICD-10-CM | POA: Diagnosis not present

## 2016-06-22 DIAGNOSIS — F411 Generalized anxiety disorder: Secondary | ICD-10-CM | POA: Diagnosis not present

## 2016-06-23 DIAGNOSIS — A047 Enterocolitis due to Clostridium difficile: Secondary | ICD-10-CM | POA: Diagnosis not present

## 2016-07-22 DIAGNOSIS — G4733 Obstructive sleep apnea (adult) (pediatric): Secondary | ICD-10-CM | POA: Diagnosis not present

## 2016-08-22 DIAGNOSIS — F3181 Bipolar II disorder: Secondary | ICD-10-CM | POA: Diagnosis not present

## 2016-08-22 DIAGNOSIS — F411 Generalized anxiety disorder: Secondary | ICD-10-CM | POA: Diagnosis not present

## 2016-09-02 DIAGNOSIS — Z23 Encounter for immunization: Secondary | ICD-10-CM | POA: Diagnosis not present

## 2016-09-29 DIAGNOSIS — M25512 Pain in left shoulder: Secondary | ICD-10-CM | POA: Diagnosis not present

## 2016-09-29 DIAGNOSIS — E785 Hyperlipidemia, unspecified: Secondary | ICD-10-CM | POA: Diagnosis not present

## 2016-09-29 DIAGNOSIS — F319 Bipolar disorder, unspecified: Secondary | ICD-10-CM | POA: Diagnosis not present

## 2016-09-29 DIAGNOSIS — K219 Gastro-esophageal reflux disease without esophagitis: Secondary | ICD-10-CM | POA: Diagnosis not present

## 2016-09-29 DIAGNOSIS — G4733 Obstructive sleep apnea (adult) (pediatric): Secondary | ICD-10-CM | POA: Diagnosis not present

## 2016-09-29 DIAGNOSIS — F329 Major depressive disorder, single episode, unspecified: Secondary | ICD-10-CM | POA: Diagnosis not present

## 2016-09-29 DIAGNOSIS — R251 Tremor, unspecified: Secondary | ICD-10-CM | POA: Diagnosis not present

## 2016-09-29 DIAGNOSIS — Z0000 Encounter for general adult medical examination without abnormal findings: Secondary | ICD-10-CM | POA: Diagnosis not present

## 2016-10-10 ENCOUNTER — Ambulatory Visit (INDEPENDENT_AMBULATORY_CARE_PROVIDER_SITE_OTHER): Payer: Medicare Other | Admitting: Orthopaedic Surgery

## 2016-10-10 DIAGNOSIS — M25512 Pain in left shoulder: Secondary | ICD-10-CM

## 2016-10-10 DIAGNOSIS — G8929 Other chronic pain: Secondary | ICD-10-CM

## 2016-10-10 MED ORDER — LIDOCAINE HCL 1 % IJ SOLN
3.0000 mL | INTRAMUSCULAR | Status: AC | PRN
  Administered 2016-10-10: 3 mL

## 2016-10-10 MED ORDER — METHYLPREDNISOLONE ACETATE 40 MG/ML IJ SUSP
40.0000 mg | INTRAMUSCULAR | Status: AC | PRN
  Administered 2016-10-10: 40 mg via INTRA_ARTICULAR

## 2016-10-10 NOTE — Progress Notes (Signed)
   Office Visit Note   Patient: [name]           Date of Birth: 1960/11/18           MRN: [id number] Visit Date: 10/10/2016              Requested by: Maurice SmallElaine Griffin, MD [address] [place], [place] [address] PCP: Astrid DivineGRIFFIN,ELAINE COLLINS, MD   Assessment & Plan: Visit Diagnoses:  1. Chronic left shoulder pain     Plan: She tolerated the injection well and her left shoulder subacromial space. She'll work on shoulder range of motion and I will like to see her back in 3 weeks to see how she doing overall. She's not made a lot of improvement we may consider physical therapy.  Follow-Up Instructions: Return in about 3 weeks (around 10/31/2016).   Orders:  No orders of the defined types were placed in this encounter.  No orders of the defined types were placed in this encounter.     Procedures: Large Joint Inj Date/Time: 10/10/2016 9:09 AM Performed by: Kathryne HitchBLACKMAN, CHRISTOPHER Y Authorized by: Kathryne HitchBLACKMAN, CHRISTOPHER Y   Location:  Shoulder Site:  L subacromial bursa Ultrasound Guidance: No   Fluoroscopic Guidance: No   Arthrogram: No   Medications:  3 mL lidocaine 1 %; 40 mg methylPREDNISolone acetate 40 MG/ML     Clinical Data: No additional findings.   Subjective: No chief complaint on file.   HPI She reports pain is been going on for months and months of her left shoulder. He can be 10 out of 10 at times. Is mainly with overhead activities and reaching behind her. She denies any specific injury. She denies any numbness and tingling in her hand. It's more activity related. She denies a specific injury. Review of Systems She denies any chest pain, shortness of breath, fever, chills, nausea, vomiting, headache  Objective: Vital Signs: There were no vitals taken for this visit.  Physical Exam She is alert and oriented 3 in no acute distress Ortho Exam Examination of her left shoulder shows almost full range of motion. Her most her painful  range of motion is with internal rotation and adduction. Rotator cuff itself feel strong. She does have positive Neer and Hawkins signs. Specialty Comments:  No specialty comments available.  Imaging: No results found. X-rays of her left shoulder that are on the cone system including 3 views, AP, axillary, oblique show well located shoulder with no acute irregularities.  PMFS History: There are no active problems to display for this patient.  Past Medical History:  Diagnosis Date  . Bipolar 1 disorder (HCC)   . Hyperlipidemia   . OSA (obstructive sleep apnea)    CPAP dependent  . PPD positive     No family history on file.  Past Surgical History:  Procedure Laterality Date  . CATARACT EXTRACTION, BILATERAL     due to seroquel  . ROBOTIC ASSISTED LAPAROSCOPIC HYSTERECTOMY AND SALPINGECTOMY     Social History   Occupational History  .      unemployed   Social History Main Topics  . Smoking status: Never Smoker  . Smokeless tobacco: Never Used  . Alcohol use No  . Drug use: No  . Sexual activity: Not on file

## 2016-10-21 DIAGNOSIS — F411 Generalized anxiety disorder: Secondary | ICD-10-CM | POA: Diagnosis not present

## 2016-10-21 DIAGNOSIS — F3132 Bipolar disorder, current episode depressed, moderate: Secondary | ICD-10-CM | POA: Diagnosis not present

## 2016-11-07 ENCOUNTER — Ambulatory Visit (INDEPENDENT_AMBULATORY_CARE_PROVIDER_SITE_OTHER): Payer: Medicare Other | Admitting: Orthopaedic Surgery

## 2016-11-07 DIAGNOSIS — G8929 Other chronic pain: Secondary | ICD-10-CM

## 2016-11-07 DIAGNOSIS — M25512 Pain in left shoulder: Secondary | ICD-10-CM

## 2016-11-07 NOTE — Progress Notes (Signed)
The patient reports improvement following her left shoulder subacromial steroid injection.  On exam she still has pain with internal rotation combined with adduction and of her left shoulder. She does shows signs of impingement. Her rotator cuff itself L strong and her liftoff is negative.  I will continue anti-inflammatories and have given her prescription for outpatient physical therapy to work on varus modalities to decrease her pain and improve her left shoulder range of motion and function. I'll see her back in 4 weeks to see how she is progressed.

## 2016-11-08 ENCOUNTER — Ambulatory Visit: Payer: Medicare Other | Attending: Orthopaedic Surgery | Admitting: Physical Therapy

## 2016-11-08 DIAGNOSIS — M6281 Muscle weakness (generalized): Secondary | ICD-10-CM | POA: Diagnosis not present

## 2016-11-08 DIAGNOSIS — M25512 Pain in left shoulder: Secondary | ICD-10-CM | POA: Insufficient documentation

## 2016-11-08 DIAGNOSIS — G8929 Other chronic pain: Secondary | ICD-10-CM | POA: Diagnosis not present

## 2016-11-08 DIAGNOSIS — R293 Abnormal posture: Secondary | ICD-10-CM | POA: Diagnosis not present

## 2016-11-08 NOTE — Addendum Note (Signed)
Addended by: Marry GuanKREIS, Mozell Hardacre M on: 11/08/2016 06:09 PM   Modules accepted: Orders

## 2016-11-08 NOTE — Patient Instructions (Addendum)

## 2016-11-08 NOTE — Therapy (Signed)
[facility] [address]  [address] [place], [place], [address] Phone: [phone]   Fax:  [phone]  Physical Therapy Evaluation  Patient Details  Name: [name] MRN: [id number] Date of Birth: 1961/09/13 Referring Provider: Dr. Doneen Poisson  Encounter Date: 11/08/2016      PT End of Session - 11/08/16 1643    Visit Number 1   Number of Visits 12   Date for PT Re-Evaluation 12/23/16   [place]ization [place]   PT Start Time 0321   PT Stop Time 0410   PT Time Calculation (min) 49 min   Activity Tolerance Patient tolerated treatment well   Behavior During Therapy [facility] for tasks assessed/performed      Past Medical History:  Diagnosis Date  . Bipolar 1 disorder (HCC)   . Hyperlipidemia   . OSA (obstructive sleep apnea)    CPAP dependent  . PPD positive     Past Surgical History:  Procedure Laterality Date  . CATARACT EXTRACTION, BILATERAL     due to seroquel  . ROBOTIC ASSISTED LAPAROSCOPIC HYSTERECTOMY AND SALPINGECTOMY      There were no vitals filed for this visit.       Subjective Assessment - 11/08/16 1521    Subjective Pt reports she is having L shoulder pain which started about 6 months ago while cleaning a tree up out of the yard. Pt thought she pulled something and has been trying things on her own. She went to see Dr. Magnus Ivan on 10/10/2016 and received a shot at that time. She followed up with him yesterday on 11/07/16 where he recommended physical therapy to continue progress of shoulder pain. Pt reports pain has been getting better since the shot but is still having trouble reaching behind back.    Limitations House hold activities   Diagnostic tests X-ray: 05/2016 negative L shoulder X ray   Patient Stated Goals "Be able to move arm behind back without it piercing"   Currently in Pain? No/denies   Pain Score 0-No pain  Avg: 0 Worst: 8-9/10 Best: 0; Pain is limited  to movements occurring behind back)   Pain Location Shoulder   Pain Orientation Left;Posterior;Upper   Pain Descriptors / Indicators Burning  "Piercing"   Pain Type Chronic pain   Pain Radiating Towards Towards mid upper arm   Pain Onset More than a month ago   Pain Frequency Intermittent   Aggravating Factors  reaching behind back and neck to get things out of pocket or put on jacket   Pain Relieving Factors Heat; Muscle relaxer if needed   Effect of Pain on Daily Activities Nuissance when trying to get dressed, cleaning around the house            [facility] PT Assessment - 11/08/16 1529      Assessment   Medical Diagnosis Left Shoulder Pain/Impingment   Referring Provider Dr. Doneen Poisson   Onset Date/Surgical Date --  6 months ago; around may   Hand Dominance Right   Next MD Visit 12/05/2016   Prior Therapy None     Balance Screen   Has the patient fallen in the past 6 months Yes   How many times? 1  passed out due to illness and fell   Has the patient had a decrease in activity level because of a fear of falling?  No   Is the patient reluctant to leave their home because of a fear of falling?  No     Home Environment   Living Environment Private residence   Living Arrangements Spouse/significant other   Type of Home House     Prior Function   Level of Independence Independent   Vocation Retired   Leisure No regular exercise     Observation/Other Assessments   Focus on Therapeutic Outcomes (FOTO)  Shoulder: Intake 67% (33% limitation) Predicted 70% (30% limitation)      Posture/Postural Control   Posture/Postural Control Postural limitations   Postural Limitations Rounded Shoulders;Forward head     ROM / Strength   AROM / PROM / Strength AROM;PROM;Strength     AROM   AROM Assessment Site Shoulder   Right/Left Shoulder Right;Left   Right Shoulder Flexion 157 Degrees   Right Shoulder ABduction 172 Degrees   Right Shoulder Internal Rotation --  FIR WFL    Right Shoulder External Rotation --  FER WFL    Left Shoulder Flexion 136 Degrees   Left Shoulder ABduction 170 Degrees  pain near 160 degrees abduction   Left Shoulder Internal Rotation --  FIR - Hands behind back to Sacrum   Left Shoulder External Rotation --  FER WFL, but slightly less ER compared to R      PROM   PROM Assessment Site Shoulder   Right/Left Shoulder Left   Left Shoulder Internal Rotation 65 Degrees  measured at 60 degrees abduction   Left Shoulder External Rotation 57 Degrees  measured at 60 degrees abduction     Strength   Strength Assessment Site Shoulder   Right/Left Shoulder Right;Left   Right Shoulder Flexion 4/5   Right Shoulder Extension --   Right Shoulder ABduction 4/5   Right Shoulder Internal Rotation 4/5   Right Shoulder External Rotation 4-/5   Left Shoulder Flexion 4-/5  Some pain   Left Shoulder ABduction 3+/5   Left Shoulder Internal Rotation 4-/5  Some pain   Left Shoulder External Rotation 3+/5  Some Pain                   [facility] Adult PT Treatment/Exercise - 11/08/16 1529      Posture/Postural Control   Posture Comments L shoulder depression compared to right     Shoulder Exercises: Seated   Retraction 5 reps   Retraction Limitations 5" holds   Other Seated Exercises Chin Tuck- 5' holds, 5 reps     Shoulder Exercises: Stretch   Corner Stretch 1 rep;30 seconds   Corner Stretch Limitations Low position only                PT Education - 11/08/16 1643    Education provided Yes   Education Details Eval Findings, POC, & initial HEP   Person(s) Educated Patient   Methods Explanation;Demonstration;Handout   Comprehension Verbalized understanding;Returned demonstration;Need further instruction          PT Short Term Goals - 11/08/16 1654      PT SHORT TERM GOAL #1   Title pt will be independent with initial HEP by 11/25/2016   Status New     PT SHORT TERM GOAL #2   Title Pt will verbalize  understanding of correct neutral spine & shoulder posture while sitting & standing by 11/25/2016   Status New           PT Long Term Goals - 11/08/16 1655      PT LONG TERM GOAL #1   Title Pt will have increased B shoulder strength >/= 4/5 by 12/23/2016  Status New     PT LONG TERM GOAL #2   Title pt will be able to perform ADLs & chores without increased L shoulder pain by 12/23/2016   Status New     PT LONG TERM GOAL #3   Title Pt will have L shoulder internal rotation WFL to help with getting dressed without increased pain by 12/23/2016   Status New     PT LONG TERM GOAL #4   Title Pt will be independent with advanced HEP by 12/23/2016   Status New               Plan - 11/08/16 1645    Clinical Impression Statement [name] is a 56 year old female who reports to therapy today with L shoulder pain. Pt was seen by Dr. Magnus IvanBlackman where she received an injection in L shoulder and referral for physical therapy. She reportedly injured L shoulder about 6 months ago following tree removal from yard. Pt states she has increased pain with movements behind the back. Upon evaluation pt has forward head and rounded shoulder posture while in sitting. Patient has limited ROM in L shoulder ER, IR, & flexion with full but painful L shoulder abduction. Pt shows mild weakness in L shoulder movements. Pt will benefit from skilled therapeutic intervention to focus on postural education, increasing strength, increasing ROM, and reducing pain with movement.    Rehab Potential Good   PT Frequency 2x / week   PT Duration 6 weeks   PT Treatment/Interventions Patient/family education;ADLs/Self Care Home Management;Therapeutic exercise;Cryotherapy;Electrical Stimulation;Moist Heat;Manual techniques;Vasopneumatic Device;Iontophoresis 4mg /ml Dexamethasone;Dry needling;Taping;Neuromuscular re-education   PT Next Visit Plan Progress scapular stabilization & Rotator Cuff exercises; Stretching for L shoulder IR; Review  initial HEP & posture; Modalities PRN   Consulted and Agree with Plan of Care Patient      Patient will benefit from skilled therapeutic intervention in order to improve the following deficits and impairments:  Pain, Impaired UE functional use, Decreased strength, Decreased range of motion, Impaired flexibility, Postural dysfunction, Decreased activity tolerance  Visit Diagnosis: Chronic left shoulder pain  Abnormal posture  Muscle weakness (generalized)      G-Codes - 11/08/16 1755    Functional Assessment Tool Used Shoulder FOTO = 67% (33% limitation)   Functional Limitation Changing and maintaining body position   Changing and Maintaining Body Position Current Status (G4010(G8981) At least 20 percent but less than 40 percent impaired, limited or restricted   Changing and Maintaining Body Position Goal Status (U7253(G8982) At least 20 percent but less than 40 percent impaired, limited or restricted       Problem List There are no active problems to display for this patient.   Katheran Jamesaylor Narek Kniss, SPT 11/08/2016, 6:03 PM  [facility] [place] [address]  [address] [place], [place], [address] Phone: [phone]   Fax:  [phone]  Name: [name] MRN: [id number] Date of Birth: Feb 13, 1961  Marry GuanJoAnne M. Kreis, PT, MPT 11/08/16, 6:03 PM  [facility] [place] [address]  [address] [place], [place], [address] Phone: [phone]   Fax:  [phone]

## 2016-11-14 ENCOUNTER — Ambulatory Visit: Payer: Medicare Other | Admitting: Physical Therapy

## 2016-11-14 DIAGNOSIS — M25512 Pain in left shoulder: Secondary | ICD-10-CM | POA: Diagnosis not present

## 2016-11-14 DIAGNOSIS — R293 Abnormal posture: Secondary | ICD-10-CM

## 2016-11-14 DIAGNOSIS — G8929 Other chronic pain: Secondary | ICD-10-CM

## 2016-11-14 DIAGNOSIS — M6281 Muscle weakness (generalized): Secondary | ICD-10-CM

## 2016-11-14 NOTE — Therapy (Signed)
[facility] [place] [address]  [address] [place], [place], [address] Phone: [phone]   Fax:  [phone]  Physical Therapy Treatment  Patient Details  Name: Despina Hiddenammy G Caicedo MRN: [id number] Date of Birth: [birth year] Referring Provider: Dr. Doneen Poissonhristopher Blackman  Encounter Date: 11/14/2016      PT End of Session - 11/14/16 1414    Visit Number 2   Number of Visits 12   Date for PT Re-Evaluation 12/23/16   [place]   PT Start Time 0115   PT Stop Time 0200   PT Time Calculation (min) 45 min   Activity Tolerance Patient tolerated treatment well   Behavior [facility] Therapy [facility] for tasks assessed/performed      Past Medical History:  Diagnosis Date  . Bipolar 1 disorder (HCC)   . Hyperlipidemia   . OSA (obstructive sleep apnea)    CPAP dependent  . PPD positive     Past Surgical History:  Procedure Laterality Date  . CATARACT EXTRACTION, BILATERAL     due to seroquel  . ROBOTIC ASSISTED LAPAROSCOPIC HYSTERECTOMY AND SALPINGECTOMY      There were no vitals filed for this visit.      Subjective Assessment - 11/14/16 1321    Subjective pt reports shoulder has been feeling sore but denies pain. She has been trying to keep up with HEP.   Patient Stated Goals "Be able to move arm behind back without it piercing"   Currently in Pain? No/denies                         [facility] Adult PT Treatment/Exercise - 11/14/16 1318      Shoulder Exercises: Supine   Horizontal ABduction Both;10 reps;Theraband   Theraband Level (Shoulder Horizontal ABduction) Level 2 (Red)   Horizontal ABduction Limitations Over foam roll + scap retraction; 3" holds   External Rotation Both;5 reps;Theraband   Theraband Level (Shoulder External Rotation) Level 2 (Red)   External Rotation Limitations Over foam roll + scap retraction; 3" holds; pain at 5th rep     Shoulder Exercises: Seated   Retraction  Both;5 reps   Retraction Limitations 5" holds   External Rotation --   Theraband Level (Shoulder External Rotation) --   External Rotation Limitations --     Shoulder Exercises: Standing   External Rotation Strengthening;Left;10 reps;Theraband   Theraband Level (Shoulder External Rotation) Level 2 (Red)   External Rotation Limitations 3" holds   Internal Rotation Strengthening;Left;10 reps;Theraband   Theraband Level (Shoulder Internal Rotation) Level 2 (Red)   Internal Rotation Limitations 3" holds   Row Both;5 reps;Theraband   Theraband Level (Shoulder Row) Level 2 (Red)   Row Limitations 5" hold      Shoulder Exercises: ROM/Strengthening   Other ROM/Strengthening Exercises Nustep lvl 3 x 6'     Shoulder Exercises: Stretch   Corner Stretch 1 rep;30 seconds   Corner Stretch Limitations Low position only   Internal Rotation Stretch 20 seconds   Internal Rotation Stretch Limitations AAROM with towel; 3 reps   Other Shoulder Stretches Pec Stretch over foam roll; 15" holds x 5 reps                PT Education - 11/14/16 1414    Education provided Yes   Education Details Update initial HEP   Person(s) Educated Patient   Methods Explanation;Demonstration;Handout   Comprehension Verbalized understanding;Returned demonstration;Need further instruction  PT Short Term Goals - 11/14/16 1821      PT SHORT TERM GOAL #1   Title pt will be independent with initial HEP by 11/25/2016   Status On-going     PT SHORT TERM GOAL #2   Title Pt will verbalize understanding of correct neutral spine & shoulder posture while sitting & standing by 11/25/2016   Status On-going           PT Long Term Goals - 11/14/16 1822      PT LONG TERM GOAL #1   Title Pt will have increased B shoulder strength >/= 4/5 by 12/23/2016   Status On-going     PT LONG TERM GOAL #2   Title pt will be able to perform ADLs & chores without increased L shoulder pain by 12/23/2016   Status On-going      PT LONG TERM GOAL #3   Title Pt will have L shoulder internal rotation WFL to help with getting dressed without increased pain by 12/23/2016   Status On-going     PT LONG TERM GOAL #4   Title Pt will be independent with advanced HEP by 12/23/2016   Status On-going               Plan - 11/14/16 1415    Clinical Impression Statement Pt returned to therapy today reporting good effort with HEP but on review of HEP with pt she reports she was trying to do exercise at home that was not given due to increased pain during session. pt was able to tolerate introduction of scapular stabilization & rotator cuff strengthening in supine and in standing. pt reported no increased pain with exercises except ER in supine. Pt reported difficulty with IR stretch but no increase in pain. Pt required minimal cueing for scapular retraction during exercises.   PT Frequency --   PT Duration --   PT Treatment/Interventions Patient/family education;ADLs/Self Care Home Management;Therapeutic exercise;Cryotherapy;Electrical Stimulation;Moist Heat;Manual techniques;Vasopneumatic Device;Iontophoresis 4mg /ml Dexamethasone;Dry needling;Taping;Neuromuscular re-education   PT Next Visit Plan Review HEP & postural awareness; continue with progression of scapular stabilization and rotator cuff strengthening; modalities PRN   Consulted and Agree with Plan of Care Patient      Patient will benefit from skilled therapeutic intervention in order to improve the following deficits and impairments:  Pain, Impaired UE functional use, Decreased strength, Decreased range of motion, Impaired flexibility, Postural dysfunction, Decreased activity tolerance  Visit Diagnosis: Chronic left shoulder pain  Abnormal posture  Muscle weakness (generalized)     Problem List There are no active problems to display for this patient.   Katheran James, SPT 11/14/2016, 6:28 PM  [facility] [address]  [address] [place], [place], [address] Phone: [phone]   Fax:  [phone]  Name: TAMEEKA LUO MRN: [id number] Date of Birth: February 07, 1961

## 2016-11-17 ENCOUNTER — Ambulatory Visit: Payer: Medicare Other | Admitting: Physical Therapy

## 2016-11-17 DIAGNOSIS — M6281 Muscle weakness (generalized): Secondary | ICD-10-CM

## 2016-11-17 DIAGNOSIS — G8929 Other chronic pain: Secondary | ICD-10-CM

## 2016-11-17 DIAGNOSIS — R293 Abnormal posture: Secondary | ICD-10-CM

## 2016-11-17 DIAGNOSIS — M25512 Pain in left shoulder: Secondary | ICD-10-CM | POA: Diagnosis not present

## 2016-11-17 NOTE — Therapy (Signed)
[facility] [address]  [address] [place], [place], [address] Phone: [phone]   Fax:  [phone]  Physical Therapy Treatment  Patient Details  Name: ANNAELLE KASEL MRN: [id number] Date of Birth: [birth year] Referring Provider: Dr. Doneen Poisson  Encounter Date: 11/17/2016      PT End of Session - 11/17/16 1355    Visit Number 3   Number of Visits 12   Date for PT Re-Evaluation 12/23/16   [place]ization [place]   PT Start Time 0150   PT Stop Time 0233   PT Time Calculation (min) 43 min   Activity Tolerance Patient tolerated treatment well   Behavior During Therapy [facility] for tasks assessed/performed      Past Medical History:  Diagnosis Date  . Bipolar 1 disorder (HCC)   . Hyperlipidemia   . OSA (obstructive sleep apnea)    CPAP dependent  . PPD positive     Past Surgical History:  Procedure Laterality Date  . CATARACT EXTRACTION, BILATERAL     due to seroquel  . ROBOTIC ASSISTED LAPAROSCOPIC HYSTERECTOMY AND SALPINGECTOMY      There were no vitals filed for this visit.      Subjective Assessment - 11/17/16 1353    Subjective Pt reports shoulder has been feeling a little bit better. She has been able to keep up with exercises and stretches at home.    Patient Stated Goals "Be able to move arm behind back without it piercing"   Currently in Pain? No/denies   Pain Score 0-No pain   Pain Location Shoulder   Pain Orientation Left;Posterior;Upper   Pain Descriptors / Indicators Burning   Pain Type Chronic pain   Pain Onset More than a month ago   Pain Frequency Intermittent   Aggravating Factors  reaching behind back and neck to get things out of pocket or put on jacket                         [facility] Adult PT Treatment/Exercise - 11/17/16 1352      Shoulder Exercises: Supine   Protraction Both;10 reps;Weights   Protraction Weight (lbs) 3#   Protraction  Limitations SA punches   Horizontal ABduction Both;15 reps;Theraband   Theraband Level (Shoulder Horizontal ABduction) Level 2 (Red)   Horizontal ABduction Limitations Over foam roll + scap retraction; 3" holds   External Rotation Both;10 reps;Theraband   Theraband Level (Shoulder External Rotation) Level 2 (Red)   External Rotation Limitations Over foam roll + scap retraction; 3" holds   Other Supine Exercises over foam roll; alt UE flex/ext with Red TB; 10 reps to each side with 3' holds     Shoulder Exercises: Standing   External Rotation Strengthening;Left;10 reps;Theraband   Theraband Level (Shoulder External Rotation) Level 2 (Red)   External Rotation Limitations 3" holds   Internal Rotation Strengthening;Left;10 reps;Theraband   Theraband Level (Shoulder Internal Rotation) Level 2 (Red)   Internal Rotation Limitations 3" holds   Extension Both;5 reps;Theraband   Theraband Level (Shoulder Extension) Level 2 (Red)   Extension Limitations 5" holds   Row Both;10 reps;Theraband   Theraband Level (Shoulder Row) Level 2 (Red)   Row Limitations 5" holds   Other Standing Exercises SA ball roll up; 10 reps; orange physioball     Shoulder Exercises: ROM/Strengthening   UBE (Upper Arm Bike) lvl 2 x 6' (3 fwd/3 back)   Wall Pushups 10 reps  Pushups Limitations 3" holds; push up +      Shoulder Exercises: Stretch   Internal Rotation Stretch 30 seconds   Internal Rotation Stretch Limitations AAROM with towel; 4 reps   Other Shoulder Stretches Pec stretch over edge of bed; 20" x 3 reps                  PT Short Term Goals - 11/14/16 1821      PT SHORT TERM GOAL #1   Title pt will be independent with initial HEP by 11/25/2016   Status On-going     PT SHORT TERM GOAL #2   Title Pt will verbalize understanding of correct neutral spine & shoulder posture while sitting & standing by 11/25/2016   Status On-going           PT Long Term Goals - 11/14/16 1822      PT LONG  TERM GOAL #1   Title Pt will have increased B shoulder strength >/= 4/5 by 12/23/2016   Status On-going     PT LONG TERM GOAL #2   Title pt will be able to perform ADLs & chores without increased L shoulder pain by 12/23/2016   Status On-going     PT LONG TERM GOAL #3   Title Pt will have L shoulder internal rotation WFL to help with getting dressed without increased pain by 12/23/2016   Status On-going     PT LONG TERM GOAL #4   Title Pt will be independent with advanced HEP by 12/23/2016   Status On-going               Plan - 11/17/16 1356    Clinical Impression Statement Pt is reporting that she feels she is able to reach behind back further following recent introduction of HEP. Pt continues to have difficulty & pain with L shoulder IR. Pt reports she has been following the HEP consistently and is starting to see some improvements. Pt continued with progression of scapular stabilization exercises and postural correction. She continues to need cuing for scapular retraction with rows and ER/IR with theraband. She reports no increased pain following exercises but does have significant muscular fatigue. Pt will continue to benefit from strengthening and stabilization of scapular & rotator cuff muscles and postural corrections.    Rehab Potential Good   PT Treatment/Interventions Patient/family education;ADLs/Self Care Home Management;Therapeutic exercise;Cryotherapy;Electrical Stimulation;Moist Heat;Manual techniques;Vasopneumatic Device;Iontophoresis 4mg /ml Dexamethasone;Dry needling;Taping;Neuromuscular re-education   PT Next Visit Plan Continue with progression of scapular stabilization & rotator cuff strengthening; modalities PRN   Consulted and Agree with Plan of Care Patient      Patient will benefit from skilled therapeutic intervention in order to improve the following deficits and impairments:  Pain, Impaired UE functional use, Decreased strength, Decreased range of motion, Impaired  flexibility, Postural dysfunction, Decreased activity tolerance  Visit Diagnosis: Chronic left shoulder pain  Abnormal posture  Muscle weakness (generalized)     Problem List There are no active problems to display for this patient.   Katheran Jamesaylor Lei Dower, SPT 11/17/2016, 3:14 PM  [facility] [address]  [address] [place], [place], [address] Phone: [phone]   Fax:  [phone]  Name: Despina Hiddenammy G Brue MRN: [id number] Date of Birth: May 12, 1961

## 2016-11-21 DIAGNOSIS — F411 Generalized anxiety disorder: Secondary | ICD-10-CM | POA: Diagnosis not present

## 2016-11-21 DIAGNOSIS — F3181 Bipolar II disorder: Secondary | ICD-10-CM | POA: Diagnosis not present

## 2016-11-22 ENCOUNTER — Ambulatory Visit: Payer: Medicare Other

## 2016-11-22 DIAGNOSIS — M25512 Pain in left shoulder: Principal | ICD-10-CM

## 2016-11-22 DIAGNOSIS — G8929 Other chronic pain: Secondary | ICD-10-CM

## 2016-11-22 DIAGNOSIS — R293 Abnormal posture: Secondary | ICD-10-CM

## 2016-11-22 DIAGNOSIS — F411 Generalized anxiety disorder: Secondary | ICD-10-CM | POA: Diagnosis not present

## 2016-11-22 DIAGNOSIS — F3181 Bipolar II disorder: Secondary | ICD-10-CM | POA: Diagnosis not present

## 2016-11-22 DIAGNOSIS — M6281 Muscle weakness (generalized): Secondary | ICD-10-CM

## 2016-11-22 NOTE — Therapy (Addendum)
[facility] [address]  [place], [place], [address] Phone: [phone]   Fax:  [phone]  Physical Therapy Treatment  Patient Details  Name: [name] MRN: [id number] Date of Birth: [birth year] Referring Provider: Dr. Jean Rosenthal  Encounter Date: 11/22/2016      PT End of Session - 11/22/16 1402    Visit Number 4   Number of Visits 12   Date for PT Re-Evaluation 12/23/16   [place]   PT Start Time 6962   PT Stop Time 1448   PT Time Calculation (min) 50 min   Activity Tolerance Patient tolerated treatment well   Behavior During Therapy [facility] for tasks assessed/performed      Past Medical History:  Diagnosis Date  . Bipolar 1 disorder ([place])   . Hyperlipidemia   . OSA (obstructive sleep apnea)    CPAP dependent  . PPD positive     Past Surgical History:  Procedure Laterality Date  . CATARACT EXTRACTION, BILATERAL     due to seroquel  . ROBOTIC ASSISTED LAPAROSCOPIC HYSTERECTOMY AND SALPINGECTOMY      There were no vitals filed for this visit.      Subjective Assessment - 11/22/16 1402    Subjective Pt. reporting she still has some pain reaching in the dryer at this point however can reach behind back much better now.   Patient Stated Goals "Be able to move arm behind back without it piercing"   Currently in Pain? No/denies   Pain Score 0-No pain   Pain Location --   Pain Orientation --   Pain Descriptors / Indicators --   Pain Type --   Pain Radiating Towards --   Pain Onset --   Pain Frequency --   Aggravating Factors  --   Pain Relieving Factors --   Multiple Pain Sites --             [facility] Adult PT Treatment/Exercise - 11/22/16 1409      Shoulder Exercises: Supine   Protraction 20 reps;Left;Weights   Protraction Weight (lbs) 4#    Horizontal ABduction Both;Theraband;20 reps   Theraband Level (Shoulder Horizontal ABduction) Level  2 (Red)   Horizontal ABduction Limitations Over foam roll + scap retraction; 3" holds   External Rotation Both;Theraband;15 reps   Theraband Level (Shoulder External Rotation) Level 2 (Red)   External Rotation Limitations Over foam roll + scap retraction; 3" holds   Flexion AROM;10 reps;Weights;Left   Shoulder Flexion Weight (lbs) 3   Other Supine Exercises Supine laying on 1/2 foam bolster cross stretch x 1 min      Shoulder Exercises: Sidelying   External Rotation AROM;15 reps   External Rotation Weight (lbs) 2   External Rotation Limitations in R sidelying    ABduction AROM;Strengthening;Left;15 reps;Weights   ABduction Weight (lbs) 3   Other Sidelying Exercises L shoulder CW, CCW with 3# x 20 reps each way     Shoulder Exercises: Standing   Extension Both;10 reps;Theraband   Theraband Level (Shoulder Extension) Level 3 (Green)   Extension Limitations 3" holds   Row Both;10 reps;Theraband   Theraband Level (Shoulder Row) Level 3 (Green)   Row Limitations 5" holds   Other Standing Exercises Wall pushups x 15 reps     Shoulder Exercises: ROM/Strengthening   UBE (Upper Arm Bike) lvl 2.5 x 6' (3 fwd/3 back)           PT Short Term  Goals - 11/22/16 1428      PT SHORT TERM GOAL #1   Title pt will be independent with initial HEP by 11/25/2016   Status Achieved     PT SHORT TERM GOAL #2   Title Pt will verbalize understanding of correct neutral spine & shoulder posture while sitting & standing by 11/25/2016   Status On-going           PT Long Term Goals - 11/14/16 1822      PT LONG TERM GOAL #1   Title Pt will have increased B shoulder strength >/= 4/5 by 12/23/2016   Status On-going     PT LONG TERM GOAL #2   Title pt will be able to perform ADLs & chores without increased L shoulder pain by 12/23/2016   Status On-going     PT LONG TERM GOAL #3   Title Pt will have L shoulder internal rotation WFL to help with getting dressed without increased pain by 12/23/2016    Status On-going     PT LONG TERM GOAL #4   Title Pt will be independent with advanced HEP by 12/23/2016   Status On-going               Plan - 11/22/16 1407    Clinical Impression Statement Pt. reporting she has seen continued benefit from therapy with ability to reach behind back improving.  Pt. still noting pain with reaching in dryer and reaching overhead at this point.  Pt. tolerated mild progression in scapular/RTC strengthening activity today well and was pain free with all therex.   Pt. reporting 2x/day HEP performance and able to verbalize good understanding and recall of HEP today.  Pt. progressing well at this point.  Will plan to progress scapular/RTC strengthening per pt. tolerance in coming visits.     PT Treatment/Interventions Patient/family education;ADLs/Self Care Home Management;Therapeutic exercise;Cryotherapy;Electrical Stimulation;Moist Heat;Manual techniques;Vasopneumatic Device;Iontophoresis 46m/ml Dexamethasone;Dry needling;Taping;Neuromuscular re-education   PT Next Visit Plan Assess STG's; Continue with progression of scapular stabilization & rotator cuff strengthening; modalities PRN      Patient will benefit from skilled therapeutic intervention in order to improve the following deficits and impairments:  Pain, Impaired UE functional use, Decreased strength, Decreased range of motion, Impaired flexibility, Postural dysfunction, Decreased activity tolerance  Visit Diagnosis: Chronic left shoulder pain  Abnormal posture  Muscle weakness (generalized)     Problem List There are no active problems to display for this patient.   MBess Harvest PTA 11/22/16 3:12 PM   [place] [address] [place] [address]: [phone]  Fax:  [phone] Name: Kristin SERENAMRN: [id number] of Birth: [birth year]  PHYSICAL THERAPY DISCHARGE SUMMARY  Visits from Start of  Care: 4  Current functional level related to goals / functional outcomes:   Unable to assess due pt calling on 11/24/16 to cancel all visits and requesting 30 day hold after last visit on 11/22/16. Pt did not return w/in 30 days, therefore proceeding with discharge.   Remaining deficits:   As above.   Education / Equipment:   HEP  Plan: Patient agrees to discharge.  Patient goals were partially met. Patient is being discharged due to not returning since the last visit.  ?????     JPercival Spanish PT, MPT 12/28/16, 10:16 AM  [facility] [address]Phone:  [phone]   Fax:  [phone]

## 2016-11-25 ENCOUNTER — Ambulatory Visit: Payer: Medicare Other | Admitting: Physical Therapy

## 2016-11-28 ENCOUNTER — Ambulatory Visit: Payer: Medicare Other | Admitting: Physical Therapy

## 2016-12-02 ENCOUNTER — Ambulatory Visit: Payer: Medicare Other

## 2016-12-05 ENCOUNTER — Ambulatory Visit (INDEPENDENT_AMBULATORY_CARE_PROVIDER_SITE_OTHER): Payer: Medicare Other | Admitting: Orthopaedic Surgery

## 2016-12-08 DIAGNOSIS — F3181 Bipolar II disorder: Secondary | ICD-10-CM | POA: Diagnosis not present

## 2016-12-08 DIAGNOSIS — F411 Generalized anxiety disorder: Secondary | ICD-10-CM | POA: Diagnosis not present

## 2016-12-20 DIAGNOSIS — F411 Generalized anxiety disorder: Secondary | ICD-10-CM | POA: Diagnosis not present

## 2016-12-20 DIAGNOSIS — F3181 Bipolar II disorder: Secondary | ICD-10-CM | POA: Diagnosis not present

## 2017-01-25 DIAGNOSIS — G4733 Obstructive sleep apnea (adult) (pediatric): Secondary | ICD-10-CM | POA: Diagnosis not present

## 2017-01-30 ENCOUNTER — Other Ambulatory Visit (HOSPITAL_BASED_OUTPATIENT_CLINIC_OR_DEPARTMENT_OTHER): Payer: Self-pay | Admitting: Family Medicine

## 2017-01-30 DIAGNOSIS — Z1231 Encounter for screening mammogram for malignant neoplasm of breast: Secondary | ICD-10-CM

## 2017-01-31 DIAGNOSIS — F411 Generalized anxiety disorder: Secondary | ICD-10-CM | POA: Diagnosis not present

## 2017-01-31 DIAGNOSIS — F3181 Bipolar II disorder: Secondary | ICD-10-CM | POA: Diagnosis not present

## 2017-02-06 DIAGNOSIS — F3181 Bipolar II disorder: Secondary | ICD-10-CM | POA: Diagnosis not present

## 2017-02-06 DIAGNOSIS — F411 Generalized anxiety disorder: Secondary | ICD-10-CM | POA: Diagnosis not present

## 2017-02-09 ENCOUNTER — Encounter (HOSPITAL_BASED_OUTPATIENT_CLINIC_OR_DEPARTMENT_OTHER): Payer: Self-pay

## 2017-02-09 ENCOUNTER — Ambulatory Visit (HOSPITAL_BASED_OUTPATIENT_CLINIC_OR_DEPARTMENT_OTHER)
Admission: RE | Admit: 2017-02-09 | Discharge: 2017-02-09 | Disposition: A | Discharge status: Home / Self Care | Payer: Medicare Other | Source: Ambulatory Visit | Attending: Family Medicine | Admitting: Family Medicine

## 2017-02-09 DIAGNOSIS — Z1231 Encounter for screening mammogram for malignant neoplasm of breast: Secondary | ICD-10-CM | POA: Diagnosis not present

## 2017-02-14 DIAGNOSIS — F411 Generalized anxiety disorder: Secondary | ICD-10-CM | POA: Diagnosis not present

## 2017-02-14 DIAGNOSIS — F3181 Bipolar II disorder: Secondary | ICD-10-CM | POA: Diagnosis not present

## 2017-02-27 DIAGNOSIS — F411 Generalized anxiety disorder: Secondary | ICD-10-CM | POA: Diagnosis not present

## 2017-02-27 DIAGNOSIS — F3181 Bipolar II disorder: Secondary | ICD-10-CM | POA: Diagnosis not present

## 2017-03-09 DIAGNOSIS — G4733 Obstructive sleep apnea (adult) (pediatric): Secondary | ICD-10-CM | POA: Diagnosis not present

## 2017-03-13 DIAGNOSIS — F3181 Bipolar II disorder: Secondary | ICD-10-CM | POA: Diagnosis not present

## 2017-03-13 DIAGNOSIS — F411 Generalized anxiety disorder: Secondary | ICD-10-CM | POA: Diagnosis not present

## 2017-03-27 DIAGNOSIS — F3181 Bipolar II disorder: Secondary | ICD-10-CM | POA: Diagnosis not present

## 2017-04-10 DIAGNOSIS — F3181 Bipolar II disorder: Secondary | ICD-10-CM | POA: Diagnosis not present

## 2017-05-09 DIAGNOSIS — F3181 Bipolar II disorder: Secondary | ICD-10-CM | POA: Diagnosis not present

## 2017-05-09 DIAGNOSIS — F332 Major depressive disorder, recurrent severe without psychotic features: Secondary | ICD-10-CM | POA: Diagnosis not present

## 2017-05-09 DIAGNOSIS — F411 Generalized anxiety disorder: Secondary | ICD-10-CM | POA: Diagnosis not present

## 2017-05-15 DIAGNOSIS — F3181 Bipolar II disorder: Secondary | ICD-10-CM | POA: Diagnosis not present

## 2017-05-29 DIAGNOSIS — F3181 Bipolar II disorder: Secondary | ICD-10-CM | POA: Diagnosis not present

## 2017-07-03 DIAGNOSIS — F3181 Bipolar II disorder: Secondary | ICD-10-CM | POA: Diagnosis not present

## 2017-07-31 DIAGNOSIS — F3181 Bipolar II disorder: Secondary | ICD-10-CM | POA: Diagnosis not present

## 2017-08-01 DIAGNOSIS — G4733 Obstructive sleep apnea (adult) (pediatric): Secondary | ICD-10-CM | POA: Diagnosis not present

## 2017-09-05 DIAGNOSIS — F3181 Bipolar II disorder: Secondary | ICD-10-CM | POA: Diagnosis not present

## 2017-09-11 DIAGNOSIS — R05 Cough: Secondary | ICD-10-CM | POA: Diagnosis not present

## 2017-09-11 DIAGNOSIS — B349 Viral infection, unspecified: Secondary | ICD-10-CM | POA: Diagnosis not present

## 2017-10-09 DIAGNOSIS — K219 Gastro-esophageal reflux disease without esophagitis: Secondary | ICD-10-CM | POA: Diagnosis not present

## 2017-10-09 DIAGNOSIS — E785 Hyperlipidemia, unspecified: Secondary | ICD-10-CM | POA: Diagnosis not present

## 2017-10-09 DIAGNOSIS — F319 Bipolar disorder, unspecified: Secondary | ICD-10-CM | POA: Diagnosis not present

## 2017-10-09 DIAGNOSIS — Z0000 Encounter for general adult medical examination without abnormal findings: Secondary | ICD-10-CM | POA: Diagnosis not present

## 2017-10-09 DIAGNOSIS — Z1159 Encounter for screening for other viral diseases: Secondary | ICD-10-CM | POA: Diagnosis not present

## 2017-12-21 ENCOUNTER — Emergency Department (HOSPITAL_BASED_OUTPATIENT_CLINIC_OR_DEPARTMENT_OTHER): Payer: Medicare Other

## 2017-12-21 ENCOUNTER — Encounter (HOSPITAL_BASED_OUTPATIENT_CLINIC_OR_DEPARTMENT_OTHER): Payer: Self-pay

## 2017-12-21 ENCOUNTER — Emergency Department (HOSPITAL_BASED_OUTPATIENT_CLINIC_OR_DEPARTMENT_OTHER)
Admission: EM | Admit: 2017-12-21 | Discharge: 2017-12-21 | Disposition: A | Discharge status: Home / Self Care | Payer: Medicare Other | Attending: Emergency Medicine | Admitting: Emergency Medicine

## 2017-12-21 ENCOUNTER — Other Ambulatory Visit: Payer: Self-pay

## 2017-12-21 DIAGNOSIS — Y999 Unspecified external cause status: Secondary | ICD-10-CM | POA: Diagnosis not present

## 2017-12-21 DIAGNOSIS — Z79899 Other long term (current) drug therapy: Secondary | ICD-10-CM | POA: Insufficient documentation

## 2017-12-21 DIAGNOSIS — S8262XA Displaced fracture of lateral malleolus of left fibula, initial encounter for closed fracture: Secondary | ICD-10-CM

## 2017-12-21 DIAGNOSIS — Y93H2 Activity, gardening and landscaping: Secondary | ICD-10-CM | POA: Insufficient documentation

## 2017-12-21 DIAGNOSIS — Y92007 Garden or yard of unspecified non-institutional (private) residence as the place of occurrence of the external cause: Secondary | ICD-10-CM | POA: Insufficient documentation

## 2017-12-21 DIAGNOSIS — S8265XA Nondisplaced fracture of lateral malleolus of left fibula, initial encounter for closed fracture: Secondary | ICD-10-CM | POA: Diagnosis not present

## 2017-12-21 DIAGNOSIS — S99912A Unspecified injury of left ankle, initial encounter: Secondary | ICD-10-CM | POA: Diagnosis present

## 2017-12-21 DIAGNOSIS — X501XXA Overexertion from prolonged static or awkward postures, initial encounter: Secondary | ICD-10-CM | POA: Insufficient documentation

## 2017-12-21 MED ORDER — TRAMADOL HCL 50 MG PO TABS
50.0000 mg | ORAL_TABLET | Freq: Once | ORAL | Status: AC
  Administered 2017-12-21: 50 mg via ORAL
  Filled 2017-12-21: qty 1

## 2017-12-21 MED ORDER — TRAMADOL HCL 50 MG PO TABS: 50.0000 mg | ORAL_TABLET | Freq: Four times a day (QID) | ORAL | 0 refills | Status: DC | PRN

## 2017-12-21 NOTE — Discharge Instructions (Signed)
Please read and follow all provided instructions.  You have been seen today for left ankle pain  Tests performed today include: An x-ray of the affected area - this showed a fracture to the lateral malleolus. I have provided you with the xray.  Vital signs. See below for your results today.   Home care instructions: -- *PRICE in the first 24-48 hours after injury: Protect (with brace, splint, sling), if given by your provider Rest Ice- Do not apply ice pack directly to your skin, place towel or similar between your skin and ice/ice pack. Apply ice for 20 min, then remove for 40 min while awake Compression- Wear brace, elastic bandage, splint as directed by your provider Elevate affected extremity above the level of your heart when not walking around for the first 24-48 hours   For pain control you may take: 800mg  of ibuprofen (that is usually four 200mg  over the counter pills) up to 3 times a day (please take with food) and acetaminophen 975mg  (this is 3 normal strength, 325mg , over the counter pills) up to four times a day. Please do not take more than this. Do not drink alcohol or combine with other medications that have acetaminophen or Ibuprofen as an ingredient (Read the labels!).    For breakthrough pain you may take Ultram. Do not drink alcohol drive or operate heavy machinery when taking. You are being provided a prescription for opiates (also known as narcotics) for pain control on an ?as needed? basis.  Opiates can be addictive and should only be used when absolutely necessary for pain control when other alternatives do not work.  We recommend you only use them for the recommended amount of time and only as prescribed.  Please do not take with other sedative medications or alcohol.  Please do not drive, operate machinery, or make important decisions while taking opiates.  Please note that these medications can be addictive and have high abuse potential.  Please keep these medications  locked away from children, teenagers or any family members with history of substance abuse. Additionally, these medications may cause constipation - take over the counter stool softeners or add fiber to your diet to treat this (Metamucil, Psyllium Fiber, Colace, Miralax) Further refills will need to be obtained from your primary care doctor and will not be prescribed through the Emergency Department. You will test positive on most drug tests while taking this medication.    Follow-up instructions: Please follow-up with orthopedic physician (bone specialist). Call tomorrow and schedule an appointment.   Return instructions:  Please return if your toes or feet are numb or tingling, appear gray or blue, or you have severe pain (also elevate the leg and loosen splint or wrap if you were given one) Please return to the Emergency Department if you experience worsening symptoms.  Please return if you have any other emergent concerns. Additional Information:  Your vital signs today were: BP (!) 158/97    Pulse 80    Temp 98.5 F (36.9 C) (Oral)    Resp 18    Ht 5\' 5"  (1.651 m)    Wt 67.1 kg (148 lb)    SpO2 97%    BMI 24.63 kg/m  If your blood pressure (BP) was elevated above 135/85 this visit, please have this repeated by your doctor within one month. ---------------

## 2017-12-21 NOTE — ED Notes (Signed)
ED Provider at bedside. 

## 2017-12-21 NOTE — ED Triage Notes (Signed)
Pt states she was working in her garden when she fell into a small hole. Pt heard a pop in her L ankle accompanied by pain.

## 2017-12-21 NOTE — ED Provider Notes (Signed)
[facility] EMERGENCY DEPARTMENT Provider Note   CSN: [id number] Arrival date & time: 12/21/17  1738     History   Chief Complaint Chief Complaint  Patient presents with  . Ankle Injury    HPI [name] is a 57 y.o. female with no significant past medical history presents emergency department today for left ankle injury that occurred earlier this evening.  Patient states that she is working in her garden when she stepped in a small hole, causing her ankle to invert and "pop".  She has been unable to walk on the ankle since the event due to pain.  She now has constant, throbbing pain on the lateral aspect of her ankle near the lateral malleolus. Movement makes the symptoms worse. She has not taken anything for symptoms prior to arrival.  She denies any associated open wounds, numbness/tingling.  HPI  Past Medical History:  Diagnosis Date  . Bipolar 1 disorder (HCC)   . Hyperlipidemia   . OSA (obstructive sleep apnea)    CPAP dependent  . PPD positive     There are no active problems to display for this patient.   Past Surgical History:  Procedure Laterality Date  . ABDOMINAL HYSTERECTOMY    . CATARACT EXTRACTION, BILATERAL     due to seroquel  . ROBOTIC ASSISTED LAPAROSCOPIC HYSTERECTOMY AND SALPINGECTOMY      OB History    No data available       Home Medications    Prior to Admission medications   Medication Sig Start Date End Date Taking? Authorizing Provider  ARIPiprazole (ABILIFY) 2 MG tablet Take 2 mg by mouth daily.    [provider]  lamoTRIgine (LAMICTAL) 200 MG tablet Take 200 mg by mouth daily.    [provider]  lithium carbonate (ESKALITH) 450 MG CR tablet Take by mouth 2 (two) times daily.    [provider]  Multiple Vitamin (MULTIVITAMIN) tablet Take 1 tablet by mouth daily.    [provider]  Omega-3 Fatty Acids (OMEGA 3 PO) Take 1,200 mg by mouth daily.    [provider]    ondansetron (ZOFRAN ODT) 4 MG disintegrating tablet Take 1 tablet (4 mg total) by mouth every 8 (eight) hours as needed for nausea. Patient not taking: Reported on 11/08/2016 05/20/16   Eber Hong, MD  ondansetron (ZOFRAN) 4 MG tablet Take 4 mg by mouth every 8 (eight) hours as needed for nausea or vomiting.    [provider]  Promethazine HCl (PHENERGAN PO) Take by mouth.    [provider]  propranolol (INDERAL) 20 MG tablet Take 20 mg by mouth 3 (three) times daily.    [provider]  QUEtiapine (SEROQUEL) 300 MG tablet Take 300 mg by mouth at bedtime.    [provider]  simvastatin (ZOCOR) 40 MG tablet Take 40 mg by mouth daily.    [provider]    Family History No family history on file.  Social History Social History   Tobacco Use  . Smoking status: Never Smoker  . Smokeless tobacco: Never Used  Substance Use Topics  . Alcohol use: No  . Drug use: No     Allergies   Prilosec [omeprazole] and Tylox [oxycodone-acetaminophen]   Review of Systems Review of Systems  Musculoskeletal: Positive for arthralgias. Negative for joint swelling.  Skin: Negative for color change and wound.  Neurological: Negative for weakness and numbness.     Physical Exam Updated Vital Signs  BP (!) 142/94 (BP Location: Left Arm)   Pulse 76   Temp 98.5 F (36.9 C) (Oral)   Resp 18   Ht 5\' 5"  (1.651 m)   Wt 67.1 kg (148 lb)   SpO2 98%   BMI 24.63 kg/m   Physical Exam  Constitutional: She appears well-developed and well-nourished.  HENT:  Head: Normocephalic and atraumatic.  Right Ear: External ear normal.  Left Ear: External ear normal.  Eyes: Conjunctivae are normal. Right eye exhibits no discharge. Left eye exhibits no discharge. No scleral icterus.  Cardiovascular:  Pulses:      Dorsalis pedis pulses are 2+ on the right side.       Posterior tibial pulses are 2+ on the right side.  Pulmonary/Chest: Effort normal. No  respiratory distress.  Musculoskeletal:       Right knee: Normal.       Right ankle: She exhibits decreased range of motion (2/2 to pain but minimal active rom intact) and swelling (lateral malleolus). She exhibits no ecchymosis, no deformity, no laceration and normal pulse. Tenderness. Lateral malleolus tenderness found. Achilles tendon normal. Achilles tendon exhibits no pain, no defect and normal Thompson's test results.       Right foot: Normal. There is normal range of motion and no tenderness.  No tenderness palpation of the proximal fibula  Neurological: She is alert. No sensory deficit.  Skin: Skin is warm and dry. Capillary refill takes less than 2 seconds. No abrasion, no ecchymosis and no laceration noted. No erythema. No pallor.  Psychiatric: She has a normal mood and affect.  Nursing note and vitals reviewed.    ED Treatments / Results  Labs (all labs ordered are listed, but only abnormal results are displayed) Labs Reviewed - No data to display  EKG  EKG Interpretation None       Radiology Dg Ankle Complete Left  Result Date: 12/21/2017 CLINICAL DATA:  Fall with ankle injury EXAM: LEFT ANKLE COMPLETE - 3+ VIEW COMPARISON:  None. FINDINGS: Acute minimally displaced fracture involving the fibular malleolar tip. Ankle mortise is symmetric. Fracture lucency extends to the lower lateral articular surface. Small phleboliths in the anterior soft tissues of the lower leg. Moderate soft tissue swelling IMPRESSION: Acute, minimally displaced distal fibular fracture. Electronically Signed   By: Jasmine Pang M.D.   On: 12/21/2017 18:31    Procedures Procedures (including critical care time) SPLINT APPLICATION Date/Time: 7:54 PM Authorized by: Jacinto Halim Consent: Verbal consent obtained. Risks and benefits: risks, benefits and alternatives were discussed Consent given by: patient Splint applied by: orthopedic technician Location details: left ankle Splint type: cam  walker Supplies used: cam walker boot  Post-procedure: The splinted body part was neurovascularly unchanged following the procedure. Patient tolerance: Patient tolerated the procedure well with no immediate complications.   Medications Ordered in ED Medications  traMADol (ULTRAM) tablet 50 mg (50 mg Oral Given 12/21/17 1932)     Initial Impression / Assessment and Plan / ED Course  I have reviewed the triage vital signs and the nursing notes.  Pertinent labs & imaging results that were available during my care of the patient were reviewed by me and considered in my medical decision making (see chart for details).     57 y.o. female with lateral ankle pain after inversion-like injury earlier today. Patient X-Ray with acute, minimally displaced distal fibular fracture.  Ankle mortise is symmetric.  There is no evidence of medial or posterior malleolar fracture. There is no TTP of  the proximal fibula or tibia to make me concerned for a [place]uve fracture. Does not appear to be Kristin unstable fracture.  Pain managed in ED. Pt advised to follow up with orthopedics for further evaluation and treatment.  Pain managed in the department. Patient given cam walker and crutches while in ED, conservative therapy recommended and discussed. Patient will be dc home & is agreeable with above plan. I have also discussed reasons to return immediately to the ER.  Patient expresses understanding and agrees with plan.  Final Clinical Impressions(s) / ED Diagnoses   Final diagnoses:  Closed fracture of distal lateral malleolus of left fibula, initial encounter    ED Discharge Orders        Ordered    traMADol (ULTRAM) 50 MG tablet  Every 6 hours PRN     12/21/17 1957       Princella PellegriniMaczis, Samreet [place] M, PA-C 12/22/17 [id number]    Plunkett, Whitney, MD 12/23/17 [phone]

## 2017-12-22 ENCOUNTER — Telehealth (INDEPENDENT_AMBULATORY_CARE_PROVIDER_SITE_OTHER): Payer: Self-pay

## 2017-12-22 NOTE — Telephone Encounter (Signed)
Pt called to schedule a f/u appt for left minimally displaced distal fibula fx (DOI 12/21/17). [facility] Roda ShuttersXu reviewed her xrays and advised he could see her Monday, March 4th. She was placed in fx boot in ED.  She will continue in the boot, elevate and ice over weekend. Pt agreed with plan.

## 2017-12-25 ENCOUNTER — Encounter (INDEPENDENT_AMBULATORY_CARE_PROVIDER_SITE_OTHER): Payer: Self-pay | Admitting: Orthopaedic Surgery

## 2017-12-25 ENCOUNTER — Ambulatory Visit (INDEPENDENT_AMBULATORY_CARE_PROVIDER_SITE_OTHER): Payer: Medicare Other | Admitting: Orthopaedic Surgery

## 2017-12-25 DIAGNOSIS — S8265XA Nondisplaced fracture of lateral malleolus of left fibula, initial encounter for closed fracture: Secondary | ICD-10-CM

## 2017-12-25 NOTE — Progress Notes (Signed)
Office Visit Note   Patient: [name]           Date of Birth: April 14, 1961           MRN: [id number] Visit Date: 12/25/2017              Requested by: Maurice SmallGriffin, Elaine, MD [address] [place], [place] [id number] PCP: Maurice SmallGriffin, Elaine, MD   Assessment & Plan: Visit Diagnoses:  1. Nondisplaced fracture of lateral malleolus of left fibula, initial encounter for closed fracture     Plan: Impression is 57 year old female with nondisplaced Weber a ankle fracture.  This should be amenable to nonoperative treatment.  We will see her back in 4 weeks with three-view x-rays of left ankle.  For now she can ambulate with crutches and Cam boot as tolerated.  Rest and ice and elevation as needed.  She is encouraged and answered.  Follow-Up Instructions: Return in about 4 weeks (around 01/22/2018).   Orders:  No orders of the defined types were placed in this encounter.  No orders of the defined types were placed in this encounter.     Procedures: No procedures performed   Clinical Data: No additional findings.   Subjective: Chief Complaint  Patient presents with  . Left Ankle - Pain    Patient is a very pleasant 57 year old female who sustained a nondisplaced Weber a ankle fracture last week.  Her pain is mild.  She takes tramadol as needed.  She is ambulating with a Cam walker and crutches.  Denies any numbness and tingling.  Overall she is feeling well.  She is currently not employed.    Review of Systems  Constitutional: Negative.   HENT: Negative.   Eyes: Negative.   Respiratory: Negative.   Cardiovascular: Negative.   Endocrine: Negative.   Musculoskeletal: Negative.   Neurological: Negative.   Hematological: Negative.   Psychiatric/Behavioral: Negative.   All other systems reviewed and are negative.    Objective: Vital Signs: There were no vitals taken for this visit.  Physical Exam  Constitutional: She is oriented to person, place, and  time. She appears well-developed and well-nourished.  HENT:  Head: Normocephalic and atraumatic.  Eyes: EOM are normal.  Neck: Neck supple.  Pulmonary/Chest: Effort normal.  Abdominal: Soft.  Neurological: She is alert and oriented to person, place, and time.  Skin: Skin is warm. Capillary refill takes less than 2 seconds.  Psychiatric: She has a normal mood and affect. Her behavior is normal. Judgment and thought content normal.  Nursing note and vitals reviewed.   Ortho Exam Left ankle exam shows mild swelling and bruising.  She has tenderness over the distal fibula.  Rest of the exam is benign.  No neurovascular compromise of the foot Specialty Comments:  No specialty comments available.  Imaging: No results found.   PMFS History: There are no active problems to display for this patient.  Past Medical History:  Diagnosis Date  . Bipolar 1 disorder (HCC)   . Hyperlipidemia   . OSA (obstructive sleep apnea)    CPAP dependent  . PPD positive     History reviewed. No pertinent family history.  Past Surgical History:  Procedure Laterality Date  . ABDOMINAL HYSTERECTOMY    . CATARACT EXTRACTION, BILATERAL     due to seroquel  . ROBOTIC ASSISTED LAPAROSCOPIC HYSTERECTOMY AND SALPINGECTOMY     Social History   Occupational History    Comment: unemployed  Tobacco Use  . Smoking status:  Never Smoker  . Smokeless tobacco: Never Used  Substance and Sexual Activity  . Alcohol use: No  . Drug use: No  . Sexual activity: Not on file

## 2018-01-19 ENCOUNTER — Other Ambulatory Visit (HOSPITAL_BASED_OUTPATIENT_CLINIC_OR_DEPARTMENT_OTHER): Payer: Self-pay | Admitting: Family Medicine

## 2018-01-19 DIAGNOSIS — Z1231 Encounter for screening mammogram for malignant neoplasm of breast: Secondary | ICD-10-CM

## 2018-01-22 ENCOUNTER — Ambulatory Visit (INDEPENDENT_AMBULATORY_CARE_PROVIDER_SITE_OTHER): Payer: Medicare Other

## 2018-01-22 ENCOUNTER — Encounter (INDEPENDENT_AMBULATORY_CARE_PROVIDER_SITE_OTHER): Payer: Self-pay | Admitting: Orthopaedic Surgery

## 2018-01-22 ENCOUNTER — Ambulatory Visit (INDEPENDENT_AMBULATORY_CARE_PROVIDER_SITE_OTHER): Payer: Medicare Other | Admitting: Orthopaedic Surgery

## 2018-01-22 DIAGNOSIS — S8265XA Nondisplaced fracture of lateral malleolus of left fibula, initial encounter for closed fracture: Secondary | ICD-10-CM | POA: Diagnosis not present

## 2018-01-22 NOTE — Progress Notes (Signed)
   Post-Op Visit Note   Patient: [name]           Date of Birth: 1961/09/03           MRN: [id number] Visit Date: 01/22/2018 PCP: Maurice SmallGriffin, Elaine, MD   Assessment & Plan:  Chief Complaint:  Chief Complaint  Patient presents with  . Left Ankle - Pain, Follow-up   Visit Diagnoses:  1. Nondisplaced fracture of lateral malleolus of left fibula, initial encounter for closed fracture     Plan: Patient is 6 weeks status post nondisplaced Weber a ankle fracture.  She is doing well.  She has no tenderness to palpation.  She has minimal swelling.  X-rays are consistent with progressive healing.  At this point she may wean out of the Cam walker and into an ASO brace.  Referral for physical therapy was made today.  Follow-up in 6 weeks for recheck and three-view x-rays of the left ankle.  Follow-Up Instructions: Return in about 6 weeks (around 03/05/2018).   Orders:  Orders Placed This Encounter  Procedures  . XR Ankle Complete Left   No orders of the defined types were placed in this encounter.   Imaging: Xr Ankle Complete Left  Result Date: 01/22/2018 Healing lateral malleolus fracture with bony consolidation.   PMFS History: There are no active problems to display for this patient.  Past Medical History:  Diagnosis Date  . Bipolar 1 disorder (HCC)   . Hyperlipidemia   . OSA (obstructive sleep apnea)    CPAP dependent  . PPD positive     History reviewed. No pertinent family history.  Past Surgical History:  Procedure Laterality Date  . ABDOMINAL HYSTERECTOMY    . CATARACT EXTRACTION, BILATERAL     due to seroquel  . ROBOTIC ASSISTED LAPAROSCOPIC HYSTERECTOMY AND SALPINGECTOMY     Social History   Occupational History    Comment: unemployed  Tobacco Use  . Smoking status: Never Smoker  . Smokeless tobacco: Never Used  Substance and Sexual Activity  . Alcohol use: No  . Drug use: No  . Sexual activity: Not on file

## 2018-02-08 DIAGNOSIS — F3181 Bipolar II disorder: Secondary | ICD-10-CM | POA: Diagnosis not present

## 2018-02-09 DIAGNOSIS — G4733 Obstructive sleep apnea (adult) (pediatric): Secondary | ICD-10-CM | POA: Diagnosis not present

## 2018-02-14 ENCOUNTER — Ambulatory Visit (HOSPITAL_BASED_OUTPATIENT_CLINIC_OR_DEPARTMENT_OTHER)
Admission: RE | Admit: 2018-02-14 | Discharge: 2018-02-14 | Disposition: A | Discharge status: Home / Self Care | Payer: Medicare Other | Source: Ambulatory Visit | Attending: Family Medicine | Admitting: Family Medicine

## 2018-02-14 DIAGNOSIS — Z1231 Encounter for screening mammogram for malignant neoplasm of breast: Secondary | ICD-10-CM | POA: Diagnosis not present

## 2018-02-20 DIAGNOSIS — F3181 Bipolar II disorder: Secondary | ICD-10-CM | POA: Diagnosis not present

## 2018-03-05 ENCOUNTER — Encounter (INDEPENDENT_AMBULATORY_CARE_PROVIDER_SITE_OTHER): Payer: Self-pay | Admitting: Orthopaedic Surgery

## 2018-03-05 ENCOUNTER — Ambulatory Visit (INDEPENDENT_AMBULATORY_CARE_PROVIDER_SITE_OTHER): Payer: Medicare Other

## 2018-03-05 ENCOUNTER — Ambulatory Visit (INDEPENDENT_AMBULATORY_CARE_PROVIDER_SITE_OTHER): Payer: Medicare Other | Admitting: Orthopaedic Surgery

## 2018-03-05 DIAGNOSIS — S8265XA Nondisplaced fracture of lateral malleolus of left fibula, initial encounter for closed fracture: Secondary | ICD-10-CM

## 2018-03-05 NOTE — Progress Notes (Signed)
   Post-Op Visit Note   Patient: [name]           Date of Birth: 12-28-1960           MRN: [id number] Visit Date: 03/05/2018 PCP: Maurice Small, MD   Assessment & Plan:  Chief Complaint:  Chief Complaint  Patient presents with  . Left Ankle - Pain, Follow-up   Visit Diagnoses:  1. Nondisplaced fracture of lateral malleolus of left fibula, initial encounter for closed fracture     Plan: Patient is 12 weeks status post Weber a ankle fracture.  She is doing well.  Denies any pain.  Clinical exam is benign.  X-rays demonstrate a healed fracture with bony consolidation.  At this point she is released to activity as tolerated.  ASO brace as needed.  Follow-up as needed.  Follow-Up Instructions: Return if symptoms worsen or fail to improve.   Orders:  Orders Placed This Encounter  Procedures  . XR Ankle Complete Left   No orders of the defined types were placed in this encounter.   Imaging: Xr Ankle Complete Left  Result Date: 03/05/2018 Healed distal fibula fracture   PMFS History: There are no active problems to display for this patient.  Past Medical History:  Diagnosis Date  . Bipolar 1 disorder (HCC)   . Hyperlipidemia   . OSA (obstructive sleep apnea)    CPAP dependent  . PPD positive     History reviewed. No pertinent family history.  Past Surgical History:  Procedure Laterality Date  . ABDOMINAL HYSTERECTOMY    . CATARACT EXTRACTION, BILATERAL     due to seroquel  . ROBOTIC ASSISTED LAPAROSCOPIC HYSTERECTOMY AND SALPINGECTOMY     Social History   Occupational History    Comment: unemployed  Tobacco Use  . Smoking status: Never Smoker  . Smokeless tobacco: Never Used  Substance and Sexual Activity  . Alcohol use: No  . Drug use: No  . Sexual activity: Not on file

## 2018-03-06 DIAGNOSIS — F3181 Bipolar II disorder: Secondary | ICD-10-CM | POA: Diagnosis not present

## 2018-03-15 DIAGNOSIS — F411 Generalized anxiety disorder: Secondary | ICD-10-CM | POA: Diagnosis not present

## 2018-03-15 DIAGNOSIS — F3181 Bipolar II disorder: Secondary | ICD-10-CM | POA: Diagnosis not present

## 2018-03-21 DIAGNOSIS — F3181 Bipolar II disorder: Secondary | ICD-10-CM | POA: Diagnosis not present

## 2018-04-03 DIAGNOSIS — F3181 Bipolar II disorder: Secondary | ICD-10-CM | POA: Diagnosis not present

## 2018-04-09 DIAGNOSIS — E785 Hyperlipidemia, unspecified: Secondary | ICD-10-CM | POA: Diagnosis not present

## 2018-04-18 DIAGNOSIS — G4733 Obstructive sleep apnea (adult) (pediatric): Secondary | ICD-10-CM | POA: Diagnosis not present

## 2018-04-19 DIAGNOSIS — F3181 Bipolar II disorder: Secondary | ICD-10-CM | POA: Diagnosis not present

## 2018-05-07 DIAGNOSIS — H524 Presbyopia: Secondary | ICD-10-CM | POA: Diagnosis not present

## 2018-05-10 DIAGNOSIS — F3181 Bipolar II disorder: Secondary | ICD-10-CM | POA: Diagnosis not present

## 2018-06-11 DIAGNOSIS — F3181 Bipolar II disorder: Secondary | ICD-10-CM | POA: Diagnosis not present

## 2018-06-28 DIAGNOSIS — F3181 Bipolar II disorder: Secondary | ICD-10-CM | POA: Diagnosis not present

## 2018-07-03 DIAGNOSIS — B009 Herpesviral infection, unspecified: Secondary | ICD-10-CM | POA: Diagnosis not present

## 2018-07-10 DIAGNOSIS — F3181 Bipolar II disorder: Secondary | ICD-10-CM | POA: Diagnosis not present

## 2018-07-24 DIAGNOSIS — F3181 Bipolar II disorder: Secondary | ICD-10-CM | POA: Diagnosis not present

## 2018-07-25 DIAGNOSIS — F411 Generalized anxiety disorder: Secondary | ICD-10-CM | POA: Diagnosis not present

## 2018-07-25 DIAGNOSIS — F3181 Bipolar II disorder: Secondary | ICD-10-CM | POA: Diagnosis not present

## 2018-08-09 DIAGNOSIS — F3181 Bipolar II disorder: Secondary | ICD-10-CM | POA: Diagnosis not present

## 2018-08-21 DIAGNOSIS — F3181 Bipolar II disorder: Secondary | ICD-10-CM | POA: Diagnosis not present

## 2018-08-25 ENCOUNTER — Emergency Department (HOSPITAL_BASED_OUTPATIENT_CLINIC_OR_DEPARTMENT_OTHER): Payer: Medicare Other

## 2018-08-25 ENCOUNTER — Emergency Department (HOSPITAL_BASED_OUTPATIENT_CLINIC_OR_DEPARTMENT_OTHER)
Admission: EM | Admit: 2018-08-25 | Discharge: 2018-08-25 | Disposition: A | Discharge status: Home / Self Care | Payer: Medicare Other | Attending: Emergency Medicine | Admitting: Emergency Medicine

## 2018-08-25 ENCOUNTER — Encounter (HOSPITAL_BASED_OUTPATIENT_CLINIC_OR_DEPARTMENT_OTHER): Payer: Self-pay | Admitting: Emergency Medicine

## 2018-08-25 ENCOUNTER — Other Ambulatory Visit: Payer: Self-pay

## 2018-08-25 DIAGNOSIS — Z79899 Other long term (current) drug therapy: Secondary | ICD-10-CM | POA: Diagnosis not present

## 2018-08-25 DIAGNOSIS — R51 Headache: Secondary | ICD-10-CM | POA: Diagnosis not present

## 2018-08-25 DIAGNOSIS — H538 Other visual disturbances: Secondary | ICD-10-CM | POA: Insufficient documentation

## 2018-08-25 DIAGNOSIS — R519 Headache, unspecified: Secondary | ICD-10-CM

## 2018-08-25 DIAGNOSIS — H539 Unspecified visual disturbance: Secondary | ICD-10-CM | POA: Diagnosis not present

## 2018-08-25 MED ORDER — TETRACAINE HCL 0.5 % OP SOLN
2.0000 [drp] | Freq: Once | OPHTHALMIC | Status: AC
  Administered 2018-08-25: 2 [drp] via OPHTHALMIC
  Filled 2018-08-25: qty 4

## 2018-08-25 NOTE — ED Notes (Signed)
Patient transported to CT 

## 2018-08-25 NOTE — ED Triage Notes (Addendum)
Headache for a week. Reports she has a floater in the R eye today and it looks like she is looking through black pepper.

## 2018-08-25 NOTE — Discharge Instructions (Signed)
1.  Return to the emergency department or call Dr. Ovidio Kin office if there are any changes in your vision between now and the time you are seen on Monday. 2.  Go to [facility] to see Dr. Cathey Endow on Monday at 8:30 AM.  They will be working you into the schedule so you may have a period of waiting.  It is however very important that you get seen on Monday.

## 2018-08-25 NOTE — ED Provider Notes (Signed)
[facility] EMERGENCY DEPARTMENT Provider Note   CSN: [id number] Arrival date & time: 08/25/18  1617     History   Chief Complaint Chief Complaint  Patient presents with  . Headache    HPI [name] is a 57 y.o. female.  HPI On Wednesday (4 days ago), the patient reports she suddenly had a lot of webs in her visual field in the right eye.  She reports her also was in appearance of pepper spots.  She reports when it first happened she reached out in front of her because she thought there were a lot of webs in front of her.  She then realized that it was in her visual field.  She reports that that lasted for several minutes and then resolved to the point of having a constant, large floater going across her visual field.  She reports if she looks at something white she still sees a peppery looking appearance as well as this floater that goes back and forth across her vision.  No double vision.  No visual changes to the left eye.No  History of similar episodes.  She reports that she has had a moderate headache behind the right eye.  Reports she has had history of migraines in the past but does not qualify this as a severity of a migraine headache.  Has been a constant discomfort and pressure.  No nausea or vomiting.  Patient wears reading glasses but otherwise not visual correction.  She reports that she was visiting friends and they incidentally checked her blood pressure and found that the pressure was elevated in the 170s over 90s.  She reports that is very typical for her and then she got more concerned.  She reports that she was going to wait and see a optometrist or ophthalmologist on Monday, but with the pressures getting elevated she was encouraged to seek treatment sooner. Past Medical History:  Diagnosis Date  . Bipolar 1 disorder (HCC)   . Hyperlipidemia   . OSA (obstructive sleep apnea)    CPAP dependent  . PPD positive     There are no active problems to  display for this patient.   Past Surgical History:  Procedure Laterality Date  . ABDOMINAL HYSTERECTOMY    . CATARACT EXTRACTION, BILATERAL     due to seroquel  . ROBOTIC ASSISTED LAPAROSCOPIC HYSTERECTOMY AND SALPINGECTOMY       OB History   None      Home Medications    Prior to Admission medications   Medication Sig Start Date End Date Taking? Authorizing Provider  ARIPiprazole (ABILIFY) 2 MG tablet Take 2 mg by mouth daily.    [provider]  lamoTRIgine (LAMICTAL) 200 MG tablet Take 200 mg by mouth daily.    [provider]  lithium carbonate (ESKALITH) 450 MG CR tablet Take by mouth 2 (two) times daily.    [provider]  Multiple Vitamin (MULTIVITAMIN) tablet Take 1 tablet by mouth daily.    [provider]  Omega-3 Fatty Acids (OMEGA 3 PO) Take 1,200 mg by mouth daily.    [provider]  ondansetron (ZOFRAN ODT) 4 MG disintegrating tablet Take 1 tablet (4 mg total) by mouth every 8 (eight) hours as needed for nausea. Patient not taking: Reported on 12/25/2017 05/20/16   Eber Hong, MD  ondansetron (ZOFRAN) 4 MG tablet Take 4 mg by mouth every 8 (eight) hours as needed for nausea or vomiting.    [provider]  Promethazine HCl (PHENERGAN PO) Take by mouth.    [provider]  propranolol (INDERAL) 20 MG tablet Take 20 mg by mouth 3 (three) times daily.    [provider]  QUEtiapine (SEROQUEL) 300 MG tablet Take 150 mg by mouth at bedtime.     [provider]  simvastatin (ZOCOR) 40 MG tablet Take 40 mg by mouth daily.    [provider]  traMADol (ULTRAM) 50 MG tablet Take 1 tablet (50 mg total) by mouth every 6 (six) hours as needed. 12/21/17   Maczis, Elmer Sow, PA-C    Family History No family history on file.  Social History Social History   Tobacco Use  . Smoking status: Never Smoker  . Smokeless tobacco: Never Used  Substance Use Topics  . Alcohol use: No  . Drug  use: No     Allergies   Prilosec [omeprazole] and Tylox [oxycodone-acetaminophen]   Review of Systems Review of Systems 10 Systems reviewed and are negative for acute change except as noted in the HPI.   Physical Exam Updated Vital Signs BP 139/80 (BP Location: Right Arm)   Pulse 64   Temp 97.7 F (36.5 C) (Oral)   Resp 18   Ht 5\' 5"  (1.651 m)   Wt 59 kg   SpO2 96%   BMI 21.63 kg/m   Physical Exam  Constitutional: She is oriented to person, place, and time. She appears well-developed and well-nourished. No distress.  HENT:  Head: Normocephalic and atraumatic.  Bilateral TMs normal.  Nares normal.  No facial tenderness to percussion or swelling.  Oral cavity pink and moist.  Good dental condition.  Eyes:  Pupils are equally round and reactive to light.  Consensual response.  No periorbital swelling.  No erythema of the conjunctivae or lids.  Funduscopic examination on the right shows normal-appearing optic disc.  To the greatest extent it which I can see the outer portions of the retina, no areas are dark or seem to be mobile. Tono-Pen bilateral ocular pressures tested both range from 16-18. No visual deficits.  Patient can see and count fingers without difficulty.  She reports the only noticeable thing is that as she shifts her visual field a large floater waves back-and-forth and from the right field.  Neck: Neck supple.  Cardiovascular: Normal rate, regular rhythm, normal heart sounds and intact distal pulses.  Pulmonary/Chest: Effort normal and breath sounds normal.  Abdominal: Soft. She exhibits no distension. There is no tenderness. There is no guarding.  Musculoskeletal: Normal range of motion. She exhibits no edema or tenderness.  Neurological: She is alert and oriented to person, place, and time. No cranial nerve deficit or sensory deficit. She exhibits normal muscle tone. Coordination normal.  Skin: Skin is warm and dry.  Psychiatric: She has a normal mood and  affect.     ED Treatments / Results  Labs (all labs ordered are listed, but only abnormal results are displayed) Labs Reviewed - No data to display  EKG None  Radiology Ct Head Wo Contrast  Result Date: 08/25/2018 CLINICAL DATA:  Headache for 10 days.  Vision changes. EXAM: CT HEAD WITHOUT CONTRAST TECHNIQUE: Contiguous axial images were obtained from the base of the skull through the vertex without intravenous contrast. COMPARISON:  None. FINDINGS: Brain: No evidence of acute infarction, hemorrhage, hydrocephalus, extra-axial collection or mass lesion/mass effect. Vascular: No hyperdense vessel or unexpected calcification. Skull: Normal. Negative for fracture or focal lesion. Sinuses/Orbits: No acute finding. Other: None. IMPRESSION: No  acute intracranial abnormality. Electronically Signed   By: Ted Mcalpine M.D.   On: 08/25/2018 17:30    Procedures Procedures (including critical care time)  Medications Ordered in ED Medications  tetracaine (PONTOCAINE) 0.5 % ophthalmic solution 2 drop (2 drops Both Eyes Given 08/25/18 1934)     Initial Impression / Assessment and Plan / ED Course  I have reviewed the triage vital signs and the nursing notes.  Pertinent labs & imaging results that were available during my care of the patient were reviewed by me and considered in my medical decision making (see chart for details).    Consult: Reviewed with Dr. Cathey Endow of [facility].  He advises the patient must come to his office on Monday at 8:30 AM.  No other interventions needed this evening.  Patient is alert and appropriate.  She does not have loss of vision but large floater through visual field the right.  Patient is alert and appropriate.  She has no other neurologic deficits or complaints.  Return precautions reviewed.  Final Clinical Impressions(s) / ED Diagnoses   Final diagnoses:  Visual changes  Acute nonintractable headache, unspecified headache type    ED  Discharge Orders    None       Arby Barrette, MD 08/25/18 2028

## 2018-08-27 DIAGNOSIS — H43811 Vitreous degeneration, right eye: Secondary | ICD-10-CM | POA: Diagnosis not present

## 2018-09-04 DIAGNOSIS — F3181 Bipolar II disorder: Secondary | ICD-10-CM | POA: Diagnosis not present

## 2018-09-18 DIAGNOSIS — F3181 Bipolar II disorder: Secondary | ICD-10-CM | POA: Diagnosis not present

## 2018-10-02 DIAGNOSIS — F3181 Bipolar II disorder: Secondary | ICD-10-CM | POA: Diagnosis not present

## 2018-10-04 DIAGNOSIS — H43811 Vitreous degeneration, right eye: Secondary | ICD-10-CM | POA: Diagnosis not present

## 2018-10-15 DIAGNOSIS — F3181 Bipolar II disorder: Secondary | ICD-10-CM | POA: Diagnosis not present

## 2018-11-06 DIAGNOSIS — F3181 Bipolar II disorder: Secondary | ICD-10-CM | POA: Diagnosis not present

## 2018-11-20 DIAGNOSIS — F3181 Bipolar II disorder: Secondary | ICD-10-CM | POA: Diagnosis not present

## 2018-12-03 ENCOUNTER — Other Ambulatory Visit (HOSPITAL_BASED_OUTPATIENT_CLINIC_OR_DEPARTMENT_OTHER): Payer: Self-pay | Admitting: Family Medicine

## 2018-12-03 DIAGNOSIS — M8580 Other specified disorders of bone density and structure, unspecified site: Secondary | ICD-10-CM

## 2018-12-03 DIAGNOSIS — E785 Hyperlipidemia, unspecified: Secondary | ICD-10-CM | POA: Diagnosis not present

## 2018-12-03 DIAGNOSIS — G4733 Obstructive sleep apnea (adult) (pediatric): Secondary | ICD-10-CM | POA: Diagnosis not present

## 2018-12-03 DIAGNOSIS — F324 Major depressive disorder, single episode, in partial remission: Secondary | ICD-10-CM | POA: Diagnosis not present

## 2018-12-03 DIAGNOSIS — Z0000 Encounter for general adult medical examination without abnormal findings: Secondary | ICD-10-CM | POA: Diagnosis not present

## 2018-12-04 DIAGNOSIS — F3181 Bipolar II disorder: Secondary | ICD-10-CM | POA: Diagnosis not present

## 2018-12-05 ENCOUNTER — Ambulatory Visit (HOSPITAL_BASED_OUTPATIENT_CLINIC_OR_DEPARTMENT_OTHER)
Admission: RE | Admit: 2018-12-05 | Discharge: 2018-12-05 | Disposition: A | Discharge status: Home / Self Care | Payer: Medicare Other | Source: Ambulatory Visit | Attending: Family Medicine | Admitting: Family Medicine

## 2018-12-05 DIAGNOSIS — M8580 Other specified disorders of bone density and structure, unspecified site: Secondary | ICD-10-CM | POA: Diagnosis not present

## 2018-12-05 DIAGNOSIS — M85832 Other specified disorders of bone density and structure, left forearm: Secondary | ICD-10-CM | POA: Insufficient documentation

## 2018-12-05 DIAGNOSIS — M85852 Other specified disorders of bone density and structure, left thigh: Secondary | ICD-10-CM | POA: Insufficient documentation

## 2018-12-05 DIAGNOSIS — M818 Other osteoporosis without current pathological fracture: Secondary | ICD-10-CM | POA: Diagnosis not present

## 2018-12-05 DIAGNOSIS — Z0120 Encounter for dental examination and cleaning without abnormal findings: Secondary | ICD-10-CM | POA: Diagnosis not present

## 2018-12-05 DIAGNOSIS — M810 Age-related osteoporosis without current pathological fracture: Secondary | ICD-10-CM | POA: Diagnosis not present

## 2018-12-14 DIAGNOSIS — M810 Age-related osteoporosis without current pathological fracture: Secondary | ICD-10-CM | POA: Insufficient documentation

## 2018-12-18 DIAGNOSIS — F3181 Bipolar II disorder: Secondary | ICD-10-CM | POA: Diagnosis not present

## 2018-12-27 DIAGNOSIS — Z5181 Encounter for therapeutic drug level monitoring: Secondary | ICD-10-CM | POA: Diagnosis not present

## 2018-12-27 DIAGNOSIS — Z131 Encounter for screening for diabetes mellitus: Secondary | ICD-10-CM | POA: Diagnosis not present

## 2018-12-27 DIAGNOSIS — Z79899 Other long term (current) drug therapy: Secondary | ICD-10-CM | POA: Diagnosis not present

## 2019-01-01 DIAGNOSIS — F3181 Bipolar II disorder: Secondary | ICD-10-CM | POA: Diagnosis not present

## 2019-02-12 DIAGNOSIS — F3181 Bipolar II disorder: Secondary | ICD-10-CM | POA: Diagnosis not present

## 2019-02-26 DIAGNOSIS — F3181 Bipolar II disorder: Secondary | ICD-10-CM | POA: Diagnosis not present

## 2019-03-07 DIAGNOSIS — F3181 Bipolar II disorder: Secondary | ICD-10-CM | POA: Diagnosis not present

## 2019-04-02 DIAGNOSIS — F3181 Bipolar II disorder: Secondary | ICD-10-CM | POA: Diagnosis not present

## 2019-04-16 DIAGNOSIS — F3181 Bipolar II disorder: Secondary | ICD-10-CM | POA: Diagnosis not present

## 2019-04-29 DIAGNOSIS — G4733 Obstructive sleep apnea (adult) (pediatric): Secondary | ICD-10-CM | POA: Diagnosis not present

## 2019-05-07 DIAGNOSIS — F3181 Bipolar II disorder: Secondary | ICD-10-CM | POA: Diagnosis not present

## 2019-05-21 DIAGNOSIS — F3181 Bipolar II disorder: Secondary | ICD-10-CM | POA: Diagnosis not present

## 2019-06-04 DIAGNOSIS — F3181 Bipolar II disorder: Secondary | ICD-10-CM | POA: Diagnosis not present

## 2019-06-12 DIAGNOSIS — G4733 Obstructive sleep apnea (adult) (pediatric): Secondary | ICD-10-CM | POA: Diagnosis not present

## 2019-06-18 DIAGNOSIS — F422 Mixed obsessional thoughts and acts: Secondary | ICD-10-CM | POA: Diagnosis not present

## 2019-06-18 DIAGNOSIS — F3181 Bipolar II disorder: Secondary | ICD-10-CM | POA: Diagnosis not present

## 2019-06-19 DIAGNOSIS — F422 Mixed obsessional thoughts and acts: Secondary | ICD-10-CM | POA: Insufficient documentation

## 2019-07-02 DIAGNOSIS — F3181 Bipolar II disorder: Secondary | ICD-10-CM | POA: Diagnosis not present

## 2019-07-04 DIAGNOSIS — F3181 Bipolar II disorder: Secondary | ICD-10-CM | POA: Diagnosis not present

## 2019-07-04 DIAGNOSIS — F411 Generalized anxiety disorder: Secondary | ICD-10-CM | POA: Diagnosis not present

## 2019-07-16 DIAGNOSIS — F3181 Bipolar II disorder: Secondary | ICD-10-CM | POA: Diagnosis not present

## 2019-07-23 DIAGNOSIS — G4733 Obstructive sleep apnea (adult) (pediatric): Secondary | ICD-10-CM | POA: Diagnosis not present

## 2019-07-30 DIAGNOSIS — F422 Mixed obsessional thoughts and acts: Secondary | ICD-10-CM | POA: Diagnosis not present

## 2019-07-30 DIAGNOSIS — F3181 Bipolar II disorder: Secondary | ICD-10-CM | POA: Diagnosis not present

## 2019-08-13 DIAGNOSIS — F3181 Bipolar II disorder: Secondary | ICD-10-CM | POA: Diagnosis not present

## 2019-08-13 DIAGNOSIS — F422 Mixed obsessional thoughts and acts: Secondary | ICD-10-CM | POA: Diagnosis not present

## 2019-08-27 DIAGNOSIS — F3181 Bipolar II disorder: Secondary | ICD-10-CM | POA: Diagnosis not present

## 2019-08-27 DIAGNOSIS — F422 Mixed obsessional thoughts and acts: Secondary | ICD-10-CM | POA: Diagnosis not present

## 2019-09-05 DIAGNOSIS — K5730 Diverticulosis of large intestine without perforation or abscess without bleeding: Secondary | ICD-10-CM | POA: Diagnosis not present

## 2019-09-05 DIAGNOSIS — Z1211 Encounter for screening for malignant neoplasm of colon: Secondary | ICD-10-CM | POA: Diagnosis not present

## 2019-09-10 DIAGNOSIS — F3181 Bipolar II disorder: Secondary | ICD-10-CM | POA: Diagnosis not present

## 2019-09-10 DIAGNOSIS — F422 Mixed obsessional thoughts and acts: Secondary | ICD-10-CM | POA: Diagnosis not present

## 2019-09-15 IMAGING — CT CT HEAD W/O CM
3 series · 17 of 47 positions shown, 20 images · non-contrast
Comparison: None.

CLINICAL DATA: Headache for 10 days.  Vision changes.

EXAM:
CT HEAD WITHOUT CONTRAST
TECHNIQUE: Contiguous axial images were obtained from the base of the skull
through the vertex without intravenous contrast.

[Series 2: head wo · axial · 0.43mm/px · z∈[+572,+702]mm · 11 of 32 slices shown, 14 images]
[im 3/32  brain]
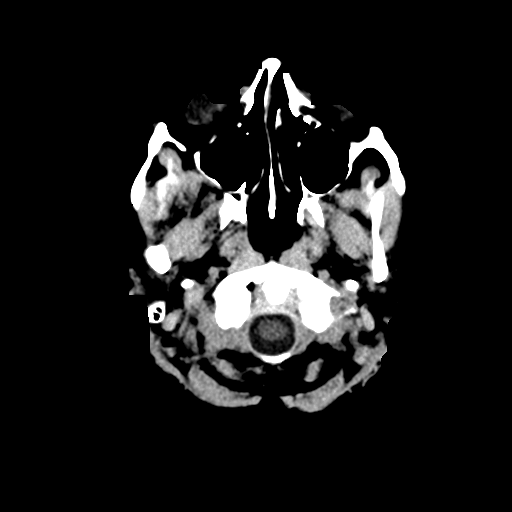
[im 3/32  bone]
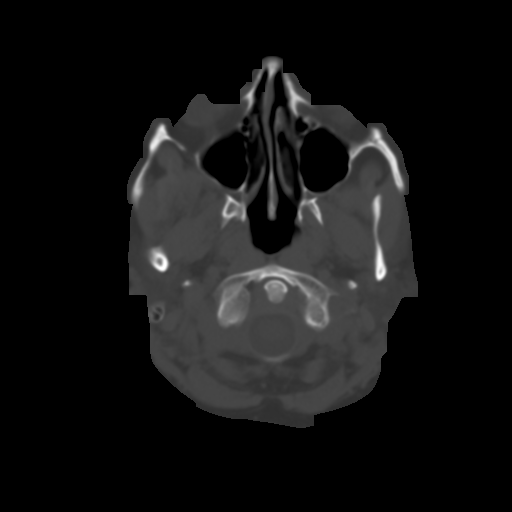
[im 5/32  brain]
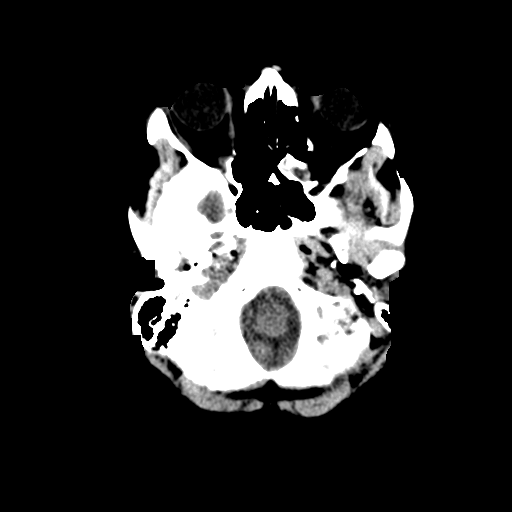
[im 8/32  brain]
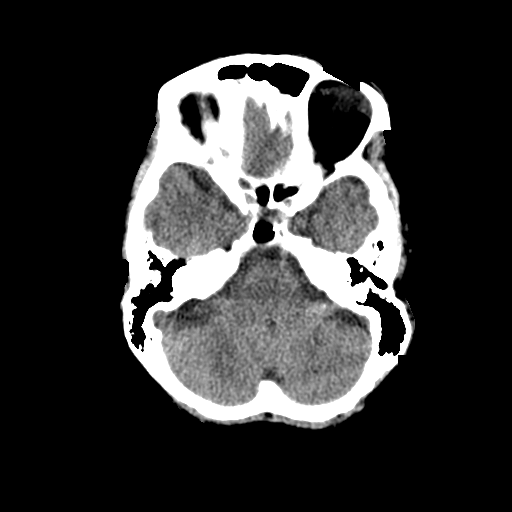
[im 10/32  brain]
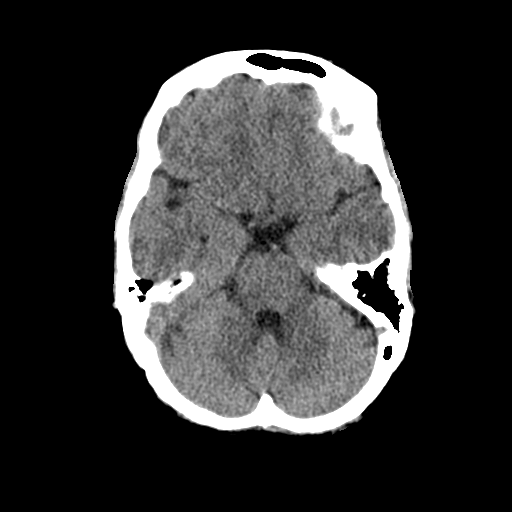
[im 13/32  brain]
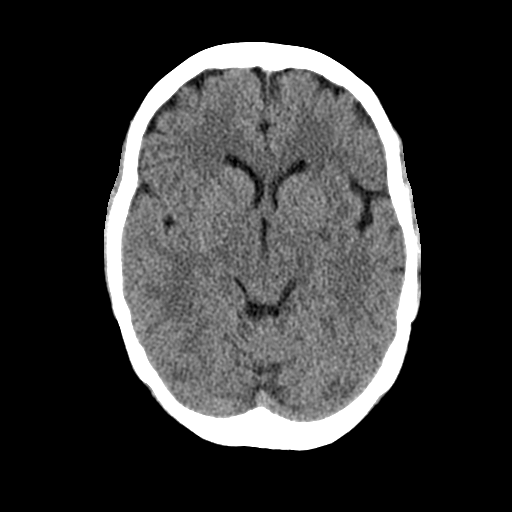
[im 13/32  bone]
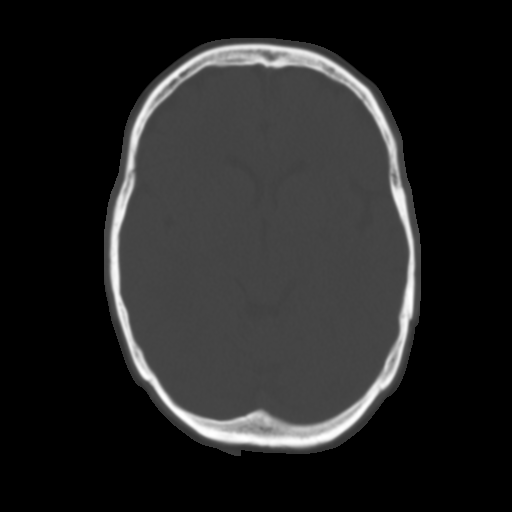
[im 17/32  brain]
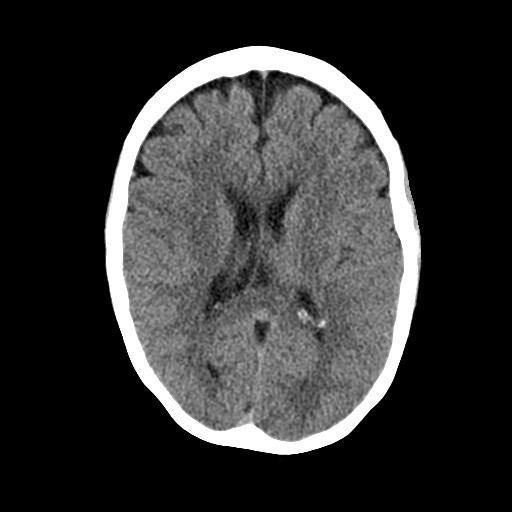
[im 19/32  brain]
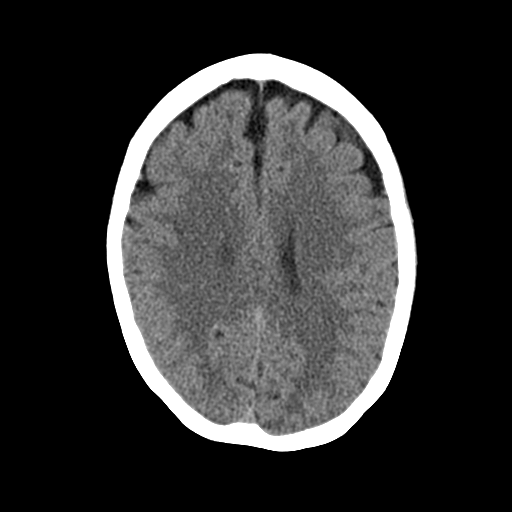
[im 22/32  brain]
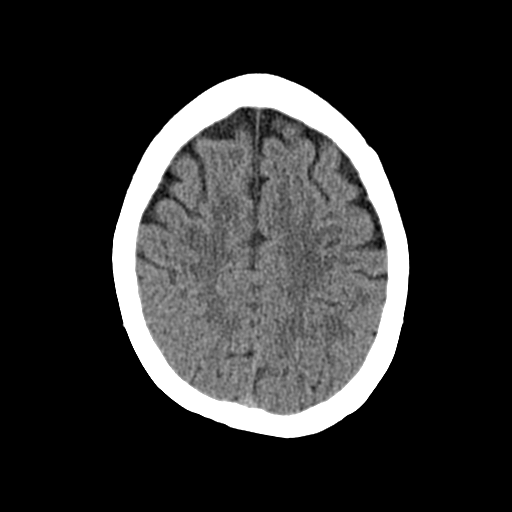
[im 24/32  brain]
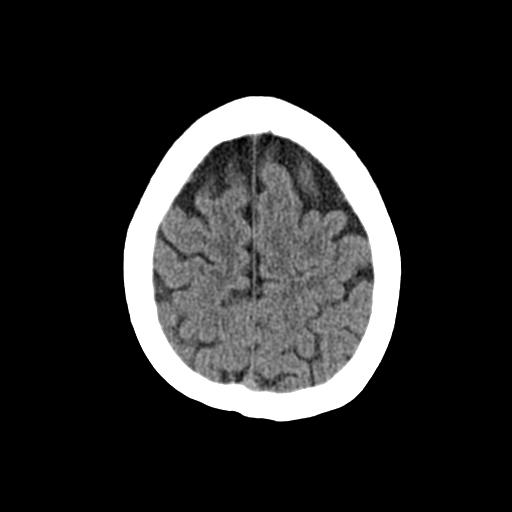
[im 24/32  bone]
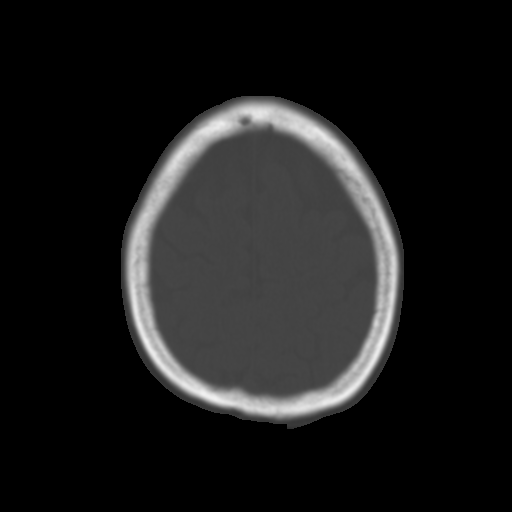
[im 27/32  brain]
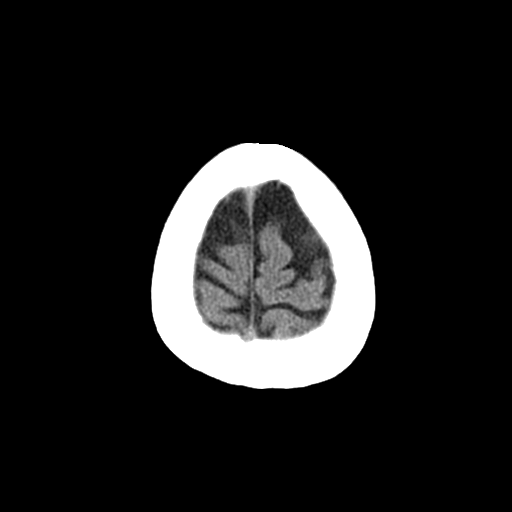
[im 29/32  brain]
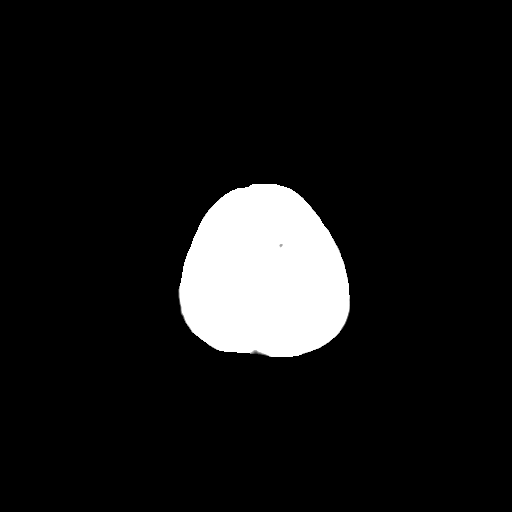

[Series 4: cor soft · coronal · 0.33mm/px · 3 of 64 slices shown]
[im 22/64  brain]
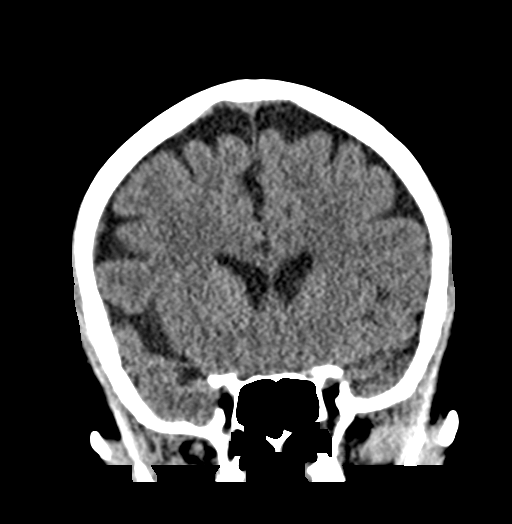
[im 29/64  brain]
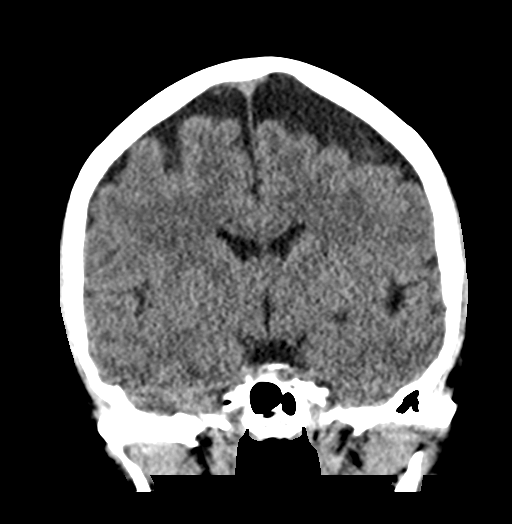
[im 36/64  brain]
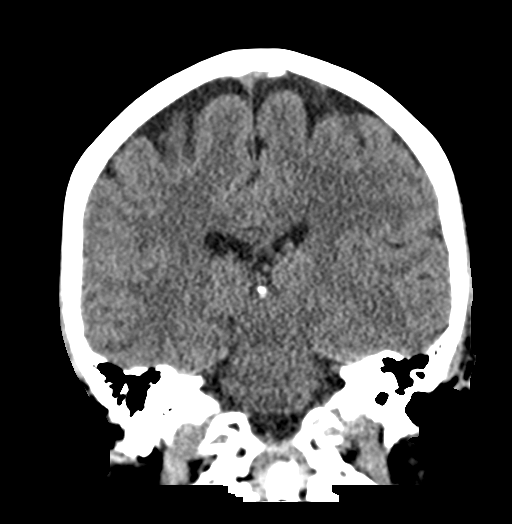

[Series 5: sag soft · sagittal · 0.34mm/px · 3 of 51 slices shown]
[im 17/51  brain]
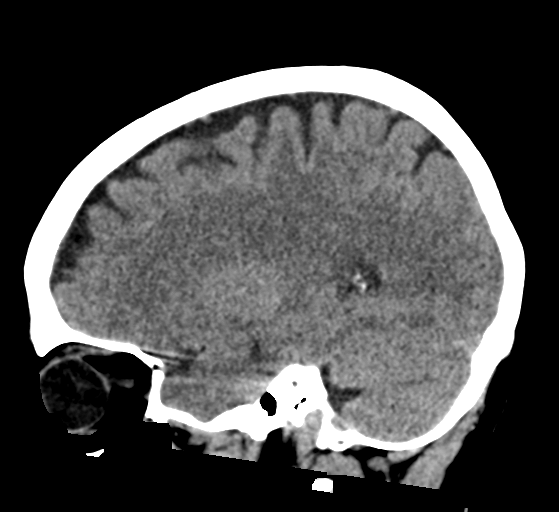
[im 26/51  brain]
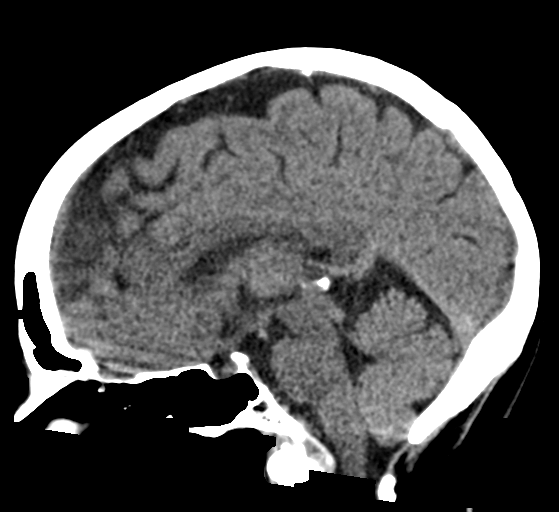
[im 34/51  brain]
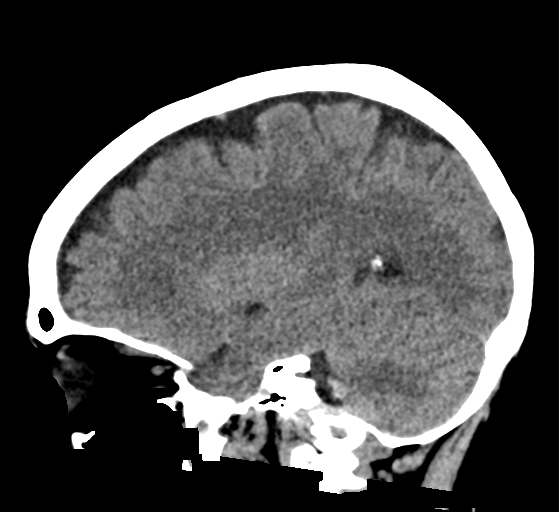

[17 of 47 positions shown; findings below may reference images not displayed]

FINDINGS: Brain: No evidence of acute infarction, hemorrhage, hydrocephalus,
extra-axial collection or mass lesion/mass effect.

Vascular: No hyperdense vessel or unexpected calcification.

Skull: Normal. Negative for fracture or focal lesion.

Sinuses/Orbits: No acute finding.

Other: None.
IMPRESSION: No acute intracranial abnormality.

## 2019-09-24 DIAGNOSIS — F422 Mixed obsessional thoughts and acts: Secondary | ICD-10-CM | POA: Diagnosis not present

## 2019-09-24 DIAGNOSIS — F3181 Bipolar II disorder: Secondary | ICD-10-CM | POA: Diagnosis not present

## 2019-10-08 DIAGNOSIS — F3181 Bipolar II disorder: Secondary | ICD-10-CM | POA: Diagnosis not present

## 2019-10-08 DIAGNOSIS — F429 Obsessive-compulsive disorder, unspecified: Secondary | ICD-10-CM | POA: Diagnosis not present

## 2019-10-21 DIAGNOSIS — G4733 Obstructive sleep apnea (adult) (pediatric): Secondary | ICD-10-CM | POA: Diagnosis not present

## 2019-10-22 DIAGNOSIS — F3181 Bipolar II disorder: Secondary | ICD-10-CM | POA: Diagnosis not present

## 2019-11-05 DIAGNOSIS — F3181 Bipolar II disorder: Secondary | ICD-10-CM | POA: Diagnosis not present

## 2019-11-19 DIAGNOSIS — F3181 Bipolar II disorder: Secondary | ICD-10-CM | POA: Diagnosis not present

## 2019-12-03 DIAGNOSIS — F3181 Bipolar II disorder: Secondary | ICD-10-CM | POA: Diagnosis not present

## 2019-12-17 DIAGNOSIS — F3181 Bipolar II disorder: Secondary | ICD-10-CM | POA: Diagnosis not present

## 2019-12-31 DIAGNOSIS — F3181 Bipolar II disorder: Secondary | ICD-10-CM | POA: Diagnosis not present

## 2020-01-09 DIAGNOSIS — Z0120 Encounter for dental examination and cleaning without abnormal findings: Secondary | ICD-10-CM | POA: Diagnosis not present

## 2020-01-14 DIAGNOSIS — F3181 Bipolar II disorder: Secondary | ICD-10-CM | POA: Diagnosis not present

## 2020-01-28 DIAGNOSIS — F3181 Bipolar II disorder: Secondary | ICD-10-CM | POA: Diagnosis not present

## 2020-02-11 DIAGNOSIS — F3181 Bipolar II disorder: Secondary | ICD-10-CM | POA: Diagnosis not present

## 2020-02-25 DIAGNOSIS — F3181 Bipolar II disorder: Secondary | ICD-10-CM | POA: Diagnosis not present

## 2020-02-25 DIAGNOSIS — F429 Obsessive-compulsive disorder, unspecified: Secondary | ICD-10-CM | POA: Diagnosis not present

## 2020-02-26 ENCOUNTER — Other Ambulatory Visit (HOSPITAL_BASED_OUTPATIENT_CLINIC_OR_DEPARTMENT_OTHER): Payer: Self-pay | Admitting: Family Medicine

## 2020-02-26 DIAGNOSIS — Z1231 Encounter for screening mammogram for malignant neoplasm of breast: Secondary | ICD-10-CM

## 2020-02-28 DIAGNOSIS — F3181 Bipolar II disorder: Secondary | ICD-10-CM | POA: Diagnosis not present

## 2020-03-10 DIAGNOSIS — F3181 Bipolar II disorder: Secondary | ICD-10-CM | POA: Diagnosis not present

## 2020-03-10 DIAGNOSIS — F429 Obsessive-compulsive disorder, unspecified: Secondary | ICD-10-CM | POA: Diagnosis not present

## 2020-03-17 ENCOUNTER — Ambulatory Visit (HOSPITAL_BASED_OUTPATIENT_CLINIC_OR_DEPARTMENT_OTHER)
Admission: RE | Admit: 2020-03-17 | Discharge: 2020-03-17 | Disposition: A | Discharge status: Home / Self Care | Payer: Medicare Other | Source: Ambulatory Visit | Attending: Family Medicine | Admitting: Family Medicine

## 2020-03-17 ENCOUNTER — Encounter (HOSPITAL_BASED_OUTPATIENT_CLINIC_OR_DEPARTMENT_OTHER): Payer: Self-pay

## 2020-03-17 ENCOUNTER — Other Ambulatory Visit: Payer: Self-pay

## 2020-03-17 DIAGNOSIS — Z1231 Encounter for screening mammogram for malignant neoplasm of breast: Secondary | ICD-10-CM | POA: Insufficient documentation

## 2020-04-07 DIAGNOSIS — F429 Obsessive-compulsive disorder, unspecified: Secondary | ICD-10-CM | POA: Diagnosis not present

## 2020-04-07 DIAGNOSIS — F3181 Bipolar II disorder: Secondary | ICD-10-CM | POA: Diagnosis not present

## 2020-04-13 ENCOUNTER — Other Ambulatory Visit: Payer: Self-pay

## 2020-04-13 ENCOUNTER — Ambulatory Visit (HOSPITAL_BASED_OUTPATIENT_CLINIC_OR_DEPARTMENT_OTHER)
Admission: RE | Admit: 2020-04-13 | Discharge: 2020-04-13 | Disposition: A | Discharge status: Home / Self Care | Payer: Medicare Other | Source: Ambulatory Visit | Attending: Family Medicine | Admitting: Family Medicine

## 2020-04-13 DIAGNOSIS — Z1231 Encounter for screening mammogram for malignant neoplasm of breast: Secondary | ICD-10-CM | POA: Insufficient documentation

## 2020-04-21 DIAGNOSIS — F3181 Bipolar II disorder: Secondary | ICD-10-CM | POA: Diagnosis not present

## 2020-05-04 DIAGNOSIS — G4733 Obstructive sleep apnea (adult) (pediatric): Secondary | ICD-10-CM | POA: Diagnosis not present

## 2020-05-05 DIAGNOSIS — F429 Obsessive-compulsive disorder, unspecified: Secondary | ICD-10-CM | POA: Diagnosis not present

## 2020-05-05 DIAGNOSIS — F3181 Bipolar II disorder: Secondary | ICD-10-CM | POA: Diagnosis not present

## 2020-05-19 DIAGNOSIS — F3181 Bipolar II disorder: Secondary | ICD-10-CM | POA: Diagnosis not present

## 2020-05-19 DIAGNOSIS — F429 Obsessive-compulsive disorder, unspecified: Secondary | ICD-10-CM | POA: Diagnosis not present

## 2020-06-02 DIAGNOSIS — F429 Obsessive-compulsive disorder, unspecified: Secondary | ICD-10-CM | POA: Diagnosis not present

## 2020-06-02 DIAGNOSIS — F3181 Bipolar II disorder: Secondary | ICD-10-CM | POA: Diagnosis not present

## 2020-06-12 DIAGNOSIS — G4733 Obstructive sleep apnea (adult) (pediatric): Secondary | ICD-10-CM | POA: Diagnosis not present

## 2020-07-07 DIAGNOSIS — F3181 Bipolar II disorder: Secondary | ICD-10-CM | POA: Diagnosis not present

## 2020-07-08 DIAGNOSIS — Z20828 Contact with and (suspected) exposure to other viral communicable diseases: Secondary | ICD-10-CM | POA: Diagnosis not present

## 2020-07-15 DIAGNOSIS — Z0120 Encounter for dental examination and cleaning without abnormal findings: Secondary | ICD-10-CM | POA: Diagnosis not present

## 2020-08-04 DIAGNOSIS — F3181 Bipolar II disorder: Secondary | ICD-10-CM | POA: Diagnosis not present

## 2020-09-01 DIAGNOSIS — F3181 Bipolar II disorder: Secondary | ICD-10-CM | POA: Diagnosis not present

## 2020-09-01 DIAGNOSIS — F429 Obsessive-compulsive disorder, unspecified: Secondary | ICD-10-CM | POA: Diagnosis not present

## 2020-10-06 DIAGNOSIS — F3181 Bipolar II disorder: Secondary | ICD-10-CM | POA: Diagnosis not present

## 2020-10-06 DIAGNOSIS — F429 Obsessive-compulsive disorder, unspecified: Secondary | ICD-10-CM | POA: Diagnosis not present

## 2020-10-28 DIAGNOSIS — R1319 Other dysphagia: Secondary | ICD-10-CM | POA: Diagnosis not present

## 2020-11-03 DIAGNOSIS — Z79899 Other long term (current) drug therapy: Secondary | ICD-10-CM | POA: Diagnosis not present

## 2020-11-03 DIAGNOSIS — F3181 Bipolar II disorder: Secondary | ICD-10-CM | POA: Diagnosis not present

## 2020-11-03 DIAGNOSIS — F411 Generalized anxiety disorder: Secondary | ICD-10-CM | POA: Diagnosis not present

## 2020-11-04 DIAGNOSIS — Q399 Congenital malformation of esophagus, unspecified: Secondary | ICD-10-CM | POA: Diagnosis not present

## 2020-11-04 DIAGNOSIS — K2950 Unspecified chronic gastritis without bleeding: Secondary | ICD-10-CM | POA: Diagnosis not present

## 2020-11-04 DIAGNOSIS — K449 Diaphragmatic hernia without obstruction or gangrene: Secondary | ICD-10-CM | POA: Diagnosis not present

## 2020-11-04 DIAGNOSIS — R1310 Dysphagia, unspecified: Secondary | ICD-10-CM | POA: Diagnosis not present

## 2020-11-04 DIAGNOSIS — K2970 Gastritis, unspecified, without bleeding: Secondary | ICD-10-CM | POA: Diagnosis not present

## 2020-11-05 DIAGNOSIS — G4733 Obstructive sleep apnea (adult) (pediatric): Secondary | ICD-10-CM | POA: Diagnosis not present

## 2020-11-17 DIAGNOSIS — F3181 Bipolar II disorder: Secondary | ICD-10-CM | POA: Diagnosis not present

## 2020-12-04 DIAGNOSIS — F319 Bipolar disorder, unspecified: Secondary | ICD-10-CM | POA: Diagnosis not present

## 2020-12-04 DIAGNOSIS — E785 Hyperlipidemia, unspecified: Secondary | ICD-10-CM | POA: Diagnosis not present

## 2020-12-04 DIAGNOSIS — Z0000 Encounter for general adult medical examination without abnormal findings: Secondary | ICD-10-CM | POA: Diagnosis not present

## 2020-12-04 DIAGNOSIS — K219 Gastro-esophageal reflux disease without esophagitis: Secondary | ICD-10-CM | POA: Diagnosis not present

## 2020-12-08 ENCOUNTER — Other Ambulatory Visit: Payer: Self-pay | Admitting: Family Medicine

## 2020-12-08 DIAGNOSIS — M810 Age-related osteoporosis without current pathological fracture: Secondary | ICD-10-CM

## 2020-12-17 ENCOUNTER — Other Ambulatory Visit: Payer: Self-pay

## 2020-12-17 ENCOUNTER — Ambulatory Visit (HOSPITAL_BASED_OUTPATIENT_CLINIC_OR_DEPARTMENT_OTHER)
Admission: RE | Admit: 2020-12-17 | Discharge: 2020-12-17 | Disposition: A | Discharge status: Home / Self Care | Payer: Medicare Other | Source: Ambulatory Visit | Attending: Family Medicine | Admitting: Family Medicine

## 2020-12-17 DIAGNOSIS — M810 Age-related osteoporosis without current pathological fracture: Secondary | ICD-10-CM | POA: Diagnosis not present

## 2021-01-04 DIAGNOSIS — F3181 Bipolar II disorder: Secondary | ICD-10-CM | POA: Diagnosis not present

## 2021-01-18 DIAGNOSIS — F429 Obsessive-compulsive disorder, unspecified: Secondary | ICD-10-CM | POA: Diagnosis not present

## 2021-01-18 DIAGNOSIS — F3181 Bipolar II disorder: Secondary | ICD-10-CM | POA: Diagnosis not present

## 2021-02-01 DIAGNOSIS — F3181 Bipolar II disorder: Secondary | ICD-10-CM | POA: Diagnosis not present

## 2021-02-03 DIAGNOSIS — Z79899 Other long term (current) drug therapy: Secondary | ICD-10-CM | POA: Diagnosis not present

## 2021-02-03 DIAGNOSIS — F3181 Bipolar II disorder: Secondary | ICD-10-CM | POA: Diagnosis not present

## 2021-02-10 DIAGNOSIS — Z5181 Encounter for therapeutic drug level monitoring: Secondary | ICD-10-CM | POA: Diagnosis not present

## 2021-02-11 DIAGNOSIS — R1310 Dysphagia, unspecified: Secondary | ICD-10-CM | POA: Diagnosis not present

## 2021-02-15 DIAGNOSIS — F3181 Bipolar II disorder: Secondary | ICD-10-CM | POA: Diagnosis not present

## 2021-03-08 DIAGNOSIS — F3181 Bipolar II disorder: Secondary | ICD-10-CM | POA: Diagnosis not present

## 2021-03-18 DIAGNOSIS — H5203 Hypermetropia, bilateral: Secondary | ICD-10-CM | POA: Diagnosis not present

## 2021-04-05 DIAGNOSIS — H26491 Other secondary cataract, right eye: Secondary | ICD-10-CM | POA: Diagnosis not present

## 2021-04-05 DIAGNOSIS — H26492 Other secondary cataract, left eye: Secondary | ICD-10-CM | POA: Diagnosis not present

## 2021-04-12 DIAGNOSIS — H26492 Other secondary cataract, left eye: Secondary | ICD-10-CM | POA: Diagnosis not present

## 2021-04-14 DIAGNOSIS — F3181 Bipolar II disorder: Secondary | ICD-10-CM | POA: Diagnosis not present

## 2021-04-21 ENCOUNTER — Other Ambulatory Visit (HOSPITAL_BASED_OUTPATIENT_CLINIC_OR_DEPARTMENT_OTHER): Payer: Self-pay | Admitting: Family Medicine

## 2021-04-21 DIAGNOSIS — Z1231 Encounter for screening mammogram for malignant neoplasm of breast: Secondary | ICD-10-CM

## 2021-05-03 DIAGNOSIS — G4733 Obstructive sleep apnea (adult) (pediatric): Secondary | ICD-10-CM | POA: Diagnosis not present

## 2021-05-12 DIAGNOSIS — F3181 Bipolar II disorder: Secondary | ICD-10-CM | POA: Diagnosis not present

## 2021-05-31 ENCOUNTER — Encounter (HOSPITAL_BASED_OUTPATIENT_CLINIC_OR_DEPARTMENT_OTHER): Payer: Self-pay

## 2021-05-31 ENCOUNTER — Ambulatory Visit (HOSPITAL_BASED_OUTPATIENT_CLINIC_OR_DEPARTMENT_OTHER)
Admission: RE | Admit: 2021-05-31 | Discharge: 2021-05-31 | Disposition: A | Discharge status: Home / Self Care | Payer: Medicare Other | Source: Ambulatory Visit | Attending: Family Medicine | Admitting: Family Medicine

## 2021-05-31 ENCOUNTER — Other Ambulatory Visit: Payer: Self-pay

## 2021-05-31 DIAGNOSIS — Z1231 Encounter for screening mammogram for malignant neoplasm of breast: Secondary | ICD-10-CM

## 2021-06-16 DIAGNOSIS — F3181 Bipolar II disorder: Secondary | ICD-10-CM | POA: Diagnosis not present

## 2021-06-30 DIAGNOSIS — F3181 Bipolar II disorder: Secondary | ICD-10-CM | POA: Diagnosis not present

## 2021-06-30 DIAGNOSIS — F429 Obsessive-compulsive disorder, unspecified: Secondary | ICD-10-CM | POA: Diagnosis not present

## 2021-07-13 DIAGNOSIS — F429 Obsessive-compulsive disorder, unspecified: Secondary | ICD-10-CM | POA: Diagnosis not present

## 2021-07-13 DIAGNOSIS — F3181 Bipolar II disorder: Secondary | ICD-10-CM | POA: Diagnosis not present

## 2021-07-26 DIAGNOSIS — F3181 Bipolar II disorder: Secondary | ICD-10-CM | POA: Diagnosis not present

## 2021-07-26 DIAGNOSIS — F429 Obsessive-compulsive disorder, unspecified: Secondary | ICD-10-CM | POA: Diagnosis not present

## 2021-08-09 DIAGNOSIS — F429 Obsessive-compulsive disorder, unspecified: Secondary | ICD-10-CM | POA: Diagnosis not present

## 2021-08-09 DIAGNOSIS — F3181 Bipolar II disorder: Secondary | ICD-10-CM | POA: Diagnosis not present

## 2021-09-01 ENCOUNTER — Ambulatory Visit (INDEPENDENT_AMBULATORY_CARE_PROVIDER_SITE_OTHER): Payer: Medicare Other | Admitting: Psychiatry

## 2021-09-01 ENCOUNTER — Encounter: Payer: Self-pay | Admitting: Psychiatry

## 2021-09-01 ENCOUNTER — Other Ambulatory Visit: Payer: Self-pay

## 2021-09-01 ENCOUNTER — Other Ambulatory Visit
Admission: RE | Admit: 2021-09-01 | Discharge: 2021-09-01 | Disposition: A | Discharge status: Home / Self Care | Payer: Medicare Other | Source: Ambulatory Visit | Attending: Psychiatry | Admitting: Psychiatry

## 2021-09-01 VITALS — BP 151/98 | HR 86 | Temp 98.5°F | Ht 65.0 in | Wt 147.2 lb

## 2021-09-01 DIAGNOSIS — F411 Generalized anxiety disorder: Secondary | ICD-10-CM

## 2021-09-01 DIAGNOSIS — B9681 Helicobacter pylori [H. pylori] as the cause of diseases classified elsewhere: Secondary | ICD-10-CM | POA: Insufficient documentation

## 2021-09-01 DIAGNOSIS — F3175 Bipolar disorder, in partial remission, most recent episode depressed: Secondary | ICD-10-CM | POA: Diagnosis not present

## 2021-09-01 DIAGNOSIS — Z9189 Other specified personal risk factors, not elsewhere classified: Secondary | ICD-10-CM | POA: Insufficient documentation

## 2021-09-01 DIAGNOSIS — Z79899 Other long term (current) drug therapy: Secondary | ICD-10-CM | POA: Insufficient documentation

## 2021-09-01 DIAGNOSIS — G4700 Insomnia, unspecified: Secondary | ICD-10-CM | POA: Diagnosis not present

## 2021-09-01 DIAGNOSIS — B009 Herpesviral infection, unspecified: Secondary | ICD-10-CM | POA: Insufficient documentation

## 2021-09-01 DIAGNOSIS — F3181 Bipolar II disorder: Secondary | ICD-10-CM | POA: Insufficient documentation

## 2021-09-01 DIAGNOSIS — K219 Gastro-esophageal reflux disease without esophagitis: Secondary | ICD-10-CM | POA: Insufficient documentation

## 2021-09-01 LAB — TSH: TSH: 0.955 u[IU]/mL (ref 0.350–4.500)

## 2021-09-01 MED ORDER — MIRTAZAPINE 7.5 MG PO TABS: 7.5000 mg | ORAL_TABLET | Freq: Every day | ORAL | 0 refills | Status: DC
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
## 2021-09-01 NOTE — Patient Instructions (Signed)
Mirtazapine Tablets °What is this medication? °MIRTAZAPINE (mir TAZ a peen) treats depression. It increases the amount of serotonin and norepinephrine in the brain, hormones that help regulate mood. °This medicine may be used for other purposes; ask your health care provider or pharmacist if you have questions. °COMMON BRAND NAME(S): Remeron °What should I tell my care team before I take this medication? °They need to know if you have any of these conditions: °Bipolar disorder °Glaucoma °Kidney disease °Liver disease °Suicidal thoughts °An unusual or allergic reaction to mirtazapine, other medications, foods, dyes, or preservatives °Pregnant or trying to get pregnant °Breast-feeding °How should I use this medication? °Take this medication by mouth with a glass of water. Follow the directions on the prescription label. Take your medication at regular intervals. Do not take your medication more often than directed. Do not stop taking this medication suddenly except upon the advice of your care team. Stopping this medication too quickly may cause serious side effects or your condition may worsen. °A special MedGuide will be given to you by the pharmacist with each prescription and refill. Be sure to read this information carefully each time. °Talk to your care team about the use of this medication in children. Special care may be needed. °Overdosage: If you think you have taken too much of this medicine contact a poison control center or emergency room at once. °NOTE: This medicine is only for you. Do not share this medicine with others. °What if I miss a dose? °If you miss a dose, take it as soon as you can. If it is almost time for your next dose, take only that dose. Do not take double or extra doses. °What may interact with this medication? °Do not take this medication with any of the following: °Linezolid °MAOIs like Carbex, Eldepryl, Marplan, Nardil, and Parnate °Methylene blue (injected into a vein) °This  medication may also interact with the following: °Alcohol °Antiviral medications for HIV or AIDS °Certain medications that treat or prevent blood clots like warfarin °Certain medications for depression, anxiety, or psychotic disturbances °Certain medications for fungal infections like ketoconazole and itraconazole °Certain medications for migraine headache like almotriptan, eletriptan, frovatriptan, naratriptan, rizatriptan, sumatriptan, zolmitriptan °Certain medications for seizures like carbamazepine or phenytoin °Certain medications for sleep °Cimetidine °Erythromycin °Fentanyl °Lithium °Medications for blood pressure °Nefazodone °Rasagiline °Rifampin °Supplements like St. John's wort, kava kava, valerian °Tramadol °Tryptophan °This list may not describe all possible interactions. Give your health care provider a list of all the medicines, herbs, non-prescription drugs, or dietary supplements you use. Also tell them if you smoke, drink alcohol, or use illegal drugs. Some items may interact with your medicine. °What should I watch for while using this medication? °Tell your care team if your symptoms do not get better or if they get worse. Visit your care team for regular checks on your progress. Because it may take several weeks to see the full effects of this medication, it is important to continue your treatment as prescribed by your doctor. °Patients and their families should watch out for new or worsening thoughts of suicide or depression. Also watch out for sudden changes in feelings such as feeling anxious, agitated, panicky, irritable, hostile, aggressive, impulsive, severely restless, overly excited and hyperactive, or not being able to sleep. If this happens, especially at the beginning of treatment or after a change in dose, call your care team. °You may get drowsy or dizzy. Do not drive, use machinery, or do anything that needs mental alertness until   you know how this medication affects you. Do not  stand or sit up quickly, especially if you are an older patient. This reduces the risk of dizzy or fainting spells. Alcohol may interfere with the effect of this medication. Avoid alcoholic drinks. °This medication may cause dry eyes and blurred vision. If you wear contact lenses you may feel some discomfort. Lubricating drops may help. See your eye care team if the problem does not go away or is severe. °Your mouth may get dry. Chewing sugarless gum or sucking hard candy, and drinking plenty of water may help. Contact your care team if the problem does not go away or is severe. °What side effects may I notice from receiving this medication? °Side effects that you should report to your care team as soon as possible: °Allergic reactions--skin rash, itching, hives, swelling of the face, lips, tongue, or throat °Heart rhythm changes--fast or irregular heartbeat, dizziness, feeling faint or lightheaded, chest pain, trouble breathing °Infection--fever, chills, cough, or sore throat °Irritability, confusion, fast or irregular heartbeat, muscle stiffness, twitching muscles, sweating, high fever, seizure, chills, vomiting, diarrhea, which may be signs of serotonin syndrome °Low sodium level--muscle weakness, fatigue, dizziness, headache, confusion °Rash, fever, and swollen lymph nodes °Redness, blistering, peeling or loosening of the skin, including inside the mouth °Seizures °Sudden eye pain or change in vision such as blurry vision, seeing halos around lights, vision loss °Thoughts of suicide or self-harm, worsening mood, feelings of depression °Side effects that usually do not require medical attention (report to your care team if they continue or are bothersome): °Constipation °Dizziness °Drowsiness °Dry mouth °Increase in appetite °Weight gain °This list may not describe all possible side effects. Call your doctor for medical advice about side effects. You may report side effects to FDA at 1-800-FDA-1088. °Where should  I keep my medication? °Keep out of the reach of children. °Store at room temperature between 15 and 30 degrees C (59 and 86 degrees F) Protect from light and moisture. Throw away any unused medication after the expiration date. °NOTE: This sheet is a summary. It may not cover all possible information. If you have questions about this medicine, talk to your doctor, pharmacist, or health care provider. °© 2022 [employer] (2021-01-06 00:00:00) ° °

## 2021-09-02 LAB — PROLACTIN: Prolactin: 5.1 ng/mL (ref 4.8–23.3)

## 2021-09-13 DIAGNOSIS — F3181 Bipolar II disorder: Secondary | ICD-10-CM | POA: Diagnosis not present

## 2021-09-20 ENCOUNTER — Ambulatory Visit: Payer: Medicare Other | Admitting: Psychiatry

## 2021-09-23 ENCOUNTER — Ambulatory Visit (INDEPENDENT_AMBULATORY_CARE_PROVIDER_SITE_OTHER): Payer: Medicare Other | Admitting: Psychiatry

## 2021-09-23 ENCOUNTER — Other Ambulatory Visit: Payer: Self-pay

## 2021-09-23 ENCOUNTER — Encounter: Payer: Self-pay | Admitting: Psychiatry

## 2021-09-23 VITALS — BP 165/93 | HR 76 | Temp 97.5°F | Ht 65.5 in | Wt 145.4 lb

## 2021-09-23 DIAGNOSIS — Z79899 Other long term (current) drug therapy: Secondary | ICD-10-CM | POA: Diagnosis not present

## 2021-09-23 DIAGNOSIS — F3175 Bipolar disorder, in partial remission, most recent episode depressed: Secondary | ICD-10-CM

## 2021-09-23 DIAGNOSIS — F411 Generalized anxiety disorder: Secondary | ICD-10-CM | POA: Diagnosis not present

## 2021-09-23 DIAGNOSIS — R030 Elevated blood-pressure reading, without diagnosis of hypertension: Secondary | ICD-10-CM

## 2021-09-23 DIAGNOSIS — G4700 Insomnia, unspecified: Secondary | ICD-10-CM

## 2021-09-23 DIAGNOSIS — F3176 Bipolar disorder, in full remission, most recent episode depressed: Secondary | ICD-10-CM | POA: Diagnosis not present

## 2021-09-23 MED ORDER — QUETIAPINE FUMARATE ER 300 MG PO TB24: 300.0000 mg | ORAL_TABLET | Freq: Every day | ORAL | 0 refills | Status: DC

## 2021-09-23 MED ORDER — MIRTAZAPINE 7.5 MG PO TABS: 7.5000 mg | ORAL_TABLET | Freq: Every day | ORAL | 0 refills | Status: DC

## 2021-09-23 MED ORDER — LAMOTRIGINE 200 MG PO TABS: 200.0000 mg | ORAL_TABLET | Freq: Two times a day (BID) | ORAL | 0 refills | Status: DC

## 2021-09-23 NOTE — Progress Notes (Signed)
BH MD OP Progress Note  09/23/2021 5:49 PM [name]  MRN:  [id number]  Chief Complaint:  Chief Complaint   Follow-up; Anxiety; Depression    HPI: [name] is a 60 year old Caucasian female, disability, married, currently lives in [place], has a history of bipolar disorder type II, generalized anxiety disorder, hyperlipidemia, osteoporosis, obstructive sleep apnea on CPAP, history of cataract surgery was evaluated in office today.  Patient today reports she is currently sleeping better on the mirtazapine.  She denies any side effects.  Patient continues to have mild mood swings, anxiety, worries a lot although she is trying to cope.  She reports it is likely because of situational stressors, housing situation.  Patient is in psychotherapy session with her therapist Dr. Dannial Monarch at [facility].  She reports she sees her every couple of weeks.  So far therapy sessions are going well.  Patient denies any suicidality, homicidality or perceptual disturbances.  She is compliant on all her other medications, denies side effects.  Patient denies any other concerns today.  Visit Diagnosis:    ICD-10-CM   1. Bipolar disorder, in full remission, most recent episode depressed (HCC)  F31.76 QUEtiapine (SEROQUEL XR) 300 MG 24 hr tablet    lamoTRIgine (LAMICTAL) 200 MG tablet   Type 2    2. GAD (generalized anxiety disorder)  F41.1 mirtazapine (REMERON) 7.5 MG tablet    3. Insomnia, unspecified type  G47.00     4. High risk medication use  Z79.899     5. Elevated blood pressure reading  R03.0       Past Psychiatric History: Reviewed past psychiatric history from progress note on 09/01/2021.  Past trials of lithium-helpful though with nausea, Ativan-made symptoms worse, Depakote-gained a lot of weight and stopped for ECT treatments, Trileptal, Latuda-acathisia, Celexa, Effexor-made her manic, trazodone, Tranxene, Zoloft, Prozac, BuSpar -not helpful,  lamotrigine-restlessness, quetiapine, Abilify, TCA  Past Medical History:  Past Medical History:  Diagnosis Date   Bipolar 1 disorder (HCC)    Hyperlipidemia    OSA (obstructive sleep apnea)    CPAP dependent   Osteoporosis    PPD positive     Past Surgical History:  Procedure Laterality Date   ABDOMINAL HYSTERECTOMY     CATARACT EXTRACTION, BILATERAL     due to seroquel   ROBOTIC ASSISTED LAPAROSCOPIC HYSTERECTOMY AND SALPINGECTOMY      Family Psychiatric History: As noted below  Family History:  Family History  Problem Relation Age of Onset   Personality disorder Mother    Alcohol abuse Maternal Uncle    Bipolar disorder Cousin    Schizophrenia Other     Social History: Reviewed social history from progress note on 09/01/2021 Social History   Socioeconomic History   Marital status: Married    Spouse name: eric   Number of children: 2   Years of education: Not on file   Highest education level: Bachelor's degree (e.g., BA, AB, BS)  Occupational History    Comment: unemployed  Tobacco Use   Smoking status: Never   Smokeless tobacco: Never  Vaping Use   Vaping Use: Never used  Substance and Sexual Activity   Alcohol use: No   Drug use: No   Sexual activity: Yes  Other Topics Concern   Not on file  Social History Narrative   Not on file   Social Determinants of Health   Financial Resource Strain: Not on file  Food Insecurity: Not on file  Transportation Needs: Not on  file  Physical Activity: Not on file  Stress: Not on file  Social Connections: Not on file    Allergies:  Allergies  Allergen Reactions   Tyloxapol Rash   Prilosec [Omeprazole]     Interacts with psych meds   Tylox [Oxycodone-Acetaminophen] Rash    Metabolic Disorder Labs: No results found for: HGBA1C, MPG Lab Results  Component Value Date   PROLACTIN 5.1 09/01/2021   No results found for: CHOL, TRIG, HDL, CHOLHDL, VLDL, LDLCALC Lab Results  Component Value Date   TSH 0.955  09/01/2021    Therapeutic Level Labs: No results found for: LITHIUM No results found for: VALPROATE No components found for:  CBMZ  Current Medications: Current Outpatient Medications  Medication Sig Dispense Refill   alendronate (FOSAMAX) 70 MG tablet TAKE 1 TABLET BY MOUTH ONCE WEEKLY 30 MINUTES BEFORE FIRST FOOD, MEDICINE, OR WATER OF THE DAY     Calcium Carb-Cholecalciferol 600-10 MG-MCG TABS Take 1 tablet by mouth daily.     hydrOXYzine (ATARAX/VISTARIL) 25 MG tablet Take 25 mg by mouth daily.     Multiple Vitamin (MULTIVITAMIN) tablet Take 1 tablet by mouth daily.     simvastatin (ZOCOR) 40 MG tablet Take 40 mg by mouth daily.     lamoTRIgine (LAMICTAL) 200 MG tablet Take 1 tablet (200 mg total) by mouth 2 (two) times daily. 180 tablet 0   mirtazapine (REMERON) 7.5 MG tablet Take 1 tablet (7.5 mg total) by mouth at bedtime. For sleep and mood 90 tablet 0   QUEtiapine (SEROQUEL XR) 300 MG 24 hr tablet Take 1 tablet (300 mg total) by mouth at bedtime. 90 tablet 0   No current facility-administered medications for this visit.     Musculoskeletal: Strength & Muscle Tone: within normal limits Gait & Station: normal Patient leans: N/A  Psychiatric Specialty Exam: Review of Systems  Psychiatric/Behavioral:  The patient is nervous/anxious.   All other systems reviewed and are negative.  Blood pressure (!) 165/93, pulse 76, temperature (!) 97.5 F (36.4 C), height 5' 5.5" (1.664 m), weight 145 lb 6.4 oz (66 kg), SpO2 100 %.Body mass index is 23.83 kg/m.  General Appearance: Casual  Eye Contact:  Fair  Speech:  Clear and Coherent  Volume:  Normal  Mood:  Anxious  Affect:  Congruent  Thought Process:  Goal Directed and Descriptions of Associations: Intact  Orientation:  Full (Time, Place, and Person)  Thought Content: Logical   Suicidal Thoughts:  No  Homicidal Thoughts:  No  Memory:  Immediate;   Fair Recent;   Fair Remote;   Fair  Judgement:  Fair  Insight:  Fair   Psychomotor Activity:  Normal  Concentration:  Concentration: Fair and Attention Span: Fair  Recall:  [employer] of Knowledge: Fair  Language: Fair  Akathisia:  No  Handed:  Right  AIMS (if indicated): done, 0  Assets:  Communication Skills Desire for Improvement Housing Intimacy Transportation  ADL's:  Intact  Cognition: WNL  Sleep:  Fair   Screenings: GAD-7    Flowsheet Row Office Visit from 09/23/2021 in [facility]  Total GAD-7 Score 8      PHQ2-9    Flowsheet Row Office Visit from 09/23/2021 in [facility] Office Visit from 09/01/2021 in [facility]  PHQ-2 Total Score 1 2  PHQ-9 Total Score 7 11      Flowsheet Row Office Visit from 09/23/2021 in [facility] Office Visit from 09/01/2021 in [facility]  C-SSRS RISK CATEGORY No Risk No Risk        Assessment and Plan: [name] is a 60 year old Caucasian female, married, on disability, lives in [place], has a history of bipolar disorder type II, GAD, multiple medical problems, obstructive sleep apnea on CPAP was evaluated in office today.  Patient is currently improving with regards to her sleep, although she continues to have anxiety likely situational.  Plan as noted below.  Plan Bipolar disorder type II in full remission Seroquel extended release 300 mg p.o. nightly Continue lamotrigine 200 mg p.o. daily Mirtazapine 7.5 mg p.o. nightly  GAD-unstable Patient advised to have more frequent psychotherapy sessions since she believes anxiety as situational.  Continue CBT with Dr. Dannial Monarch at [facility]. Patient prefers to stay on mirtazapine 7.5 mg p.o. nightly for now. Continue hydroxyzine 25 mg p.o. daily as needed for severe anxiety attacks  Insomnia-stable Continue mirtazapine 7.5 mg p.o. nightly. Patient with history of obstructive sleep  apnea likely also contributing to sleep problems.  High risk medication use-reviewed and discussed labs-dated TSH-0.955-within normal limits, prolactin-5.1-within normal limits.  Elevated blood pressure reading-unstable Patient to follow up with primary care provider for elevated blood pressure reading in session today.  Follow-up in clinic in 6 to 8 weeks or sooner if needed.  This note was generated in part or whole with voice recognition software. Voice recognition is usually quite accurate but there are transcription errors that can and very often do occur. I apologize for any typographical errors that were not detected and corrected.       Jomarie Longs, MD 09/24/2021, 12:58 PM

## 2021-09-24 DIAGNOSIS — R030 Elevated blood-pressure reading, without diagnosis of hypertension: Secondary | ICD-10-CM | POA: Insufficient documentation

## 2021-10-01 DIAGNOSIS — R42 Dizziness and giddiness: Secondary | ICD-10-CM | POA: Diagnosis not present

## 2021-10-01 DIAGNOSIS — R030 Elevated blood-pressure reading, without diagnosis of hypertension: Secondary | ICD-10-CM | POA: Diagnosis not present

## 2021-10-04 DIAGNOSIS — F3181 Bipolar II disorder: Secondary | ICD-10-CM | POA: Diagnosis not present

## 2021-10-07 DIAGNOSIS — G4733 Obstructive sleep apnea (adult) (pediatric): Secondary | ICD-10-CM | POA: Diagnosis not present

## 2021-11-04 DIAGNOSIS — F3181 Bipolar II disorder: Secondary | ICD-10-CM | POA: Diagnosis not present

## 2021-11-07 DIAGNOSIS — G4733 Obstructive sleep apnea (adult) (pediatric): Secondary | ICD-10-CM | POA: Diagnosis not present

## 2021-11-17 ENCOUNTER — Other Ambulatory Visit: Payer: Self-pay

## 2021-11-17 ENCOUNTER — Ambulatory Visit (INDEPENDENT_AMBULATORY_CARE_PROVIDER_SITE_OTHER): Payer: Medicare Other | Admitting: Psychiatry

## 2021-11-17 ENCOUNTER — Encounter: Payer: Self-pay | Admitting: Psychiatry

## 2021-11-17 VITALS — BP 139/86 | HR 82 | Temp 98.1°F | Wt 148.6 lb

## 2021-11-17 DIAGNOSIS — F411 Generalized anxiety disorder: Secondary | ICD-10-CM

## 2021-11-17 DIAGNOSIS — G4701 Insomnia due to medical condition: Secondary | ICD-10-CM

## 2021-11-17 DIAGNOSIS — F3176 Bipolar disorder, in full remission, most recent episode depressed: Secondary | ICD-10-CM

## 2021-11-17 MED ORDER — QUETIAPINE FUMARATE ER 200 MG PO TB24: 200.0000 mg | ORAL_TABLET | Freq: Every day | ORAL | 1 refills | Status: DC

## 2021-11-17 NOTE — Progress Notes (Signed)
BH MD OP Progress Note  11/17/2021 3:45 PM [name]  MRN:  [id number]  Chief Complaint:  Chief Complaint   Follow-up 61 year old Caucasian female, has a history of bipolar disorder type II, generalized anxiety disorder, insomnia presented for medication management.    HPI: [name] is a 61 year old Caucasian female on disability, married, currently lives at [facility], has a history of bipolar disorder type II, generalized anxiety disorder, hyperlipidemia, osteoporosis, obstructive sleep apnea on CPAP, history of cataract surgery was evaluated in office today.  Patient today returns reporting she is currently doing fairly well with regards to her mood.  She does have anxiety symptoms on and off however she has been making use of mindfulness, other strategies and currently follows up with her therapist at [place]-Dr. Dannial Monarch.  Patient reports she is currently compliant on her medications.  She does struggle with increased appetite likely induced by her medications like Seroquel and mirtazapine.  She likes the mirtazapine since she is sleeping better.  She is interested in being tapered down from the Seroquel.  Patient denies any suicidality, homicidality or perceptual disturbances.  Patient denies any other concerns today.  Visit Diagnosis:    ICD-10-CM   1. Bipolar disorder, in full remission, most recent episode depressed (HCC)  F31.76 QUEtiapine (SEROQUEL XR) 200 MG 24 hr tablet   Type 2    2. GAD (generalized anxiety disorder)  F41.1     3. Insomnia due to medical condition  G47.01    anxiety, sleep apnea      Past Psychiatric History: Reviewed past psychiatric history from progress note on 09/01/2021.  Past trials of lithium-helpful though with nausea, Ativan-made symptoms worse, Depakote-gained a lot of weight and stopped for ECT treatments, Trileptal, Latuda-akathisia, Celexa, Effexor-made her manic, trazodone,tranxene, Zoloft, Prozac, BuSpar-not  helpful, lamotrigine-restlessness, quetiapine, Abilify, TCA.  Past Medical History:  Past Medical History:  Diagnosis Date   Bipolar 1 disorder (HCC)    Hyperlipidemia    OSA (obstructive sleep apnea)    CPAP dependent   Osteoporosis    PPD positive     Past Surgical History:  Procedure Laterality Date   ABDOMINAL HYSTERECTOMY     CATARACT EXTRACTION, BILATERAL     due to seroquel   ROBOTIC ASSISTED LAPAROSCOPIC HYSTERECTOMY AND SALPINGECTOMY      Family Psychiatric History: Reviewed family history from progress note on 09/01/2021.  Family History:  Family History  Problem Relation Age of Onset   Personality disorder Mother    Alcohol abuse Maternal Uncle    Bipolar disorder Cousin    Schizophrenia Other     Social History: Reviewed social history from progress note on 09/01/2021. Social History   Socioeconomic History   Marital status: Married    Spouse name: eric   Number of children: 2   Years of education: Not on file   Highest education level: Bachelor's degree (e.g., BA, AB, BS)  Occupational History    Comment: unemployed  Tobacco Use   Smoking status: Never   Smokeless tobacco: Never  Vaping Use   Vaping Use: Never used  Substance and Sexual Activity   Alcohol use: No   Drug use: No   Sexual activity: Yes  Other Topics Concern   Not on file  Social History Narrative   Not on file   Social Determinants of Health   Financial Resource Strain: Not on file  Food Insecurity: Not on file  Transportation Needs: Not on file  Physical Activity: Not  on file  Stress: Not on file  Social Connections: Not on file    Allergies:  Allergies  Allergen Reactions   Tyloxapol Rash   Prilosec [Omeprazole]     Interacts with psych meds   Tylox [Oxycodone-Acetaminophen] Rash    Metabolic Disorder Labs: No results found for: HGBA1C, MPG Lab Results  Component Value Date   PROLACTIN 5.1 09/01/2021   No results found for: CHOL, TRIG, HDL, CHOLHDL, VLDL,  LDLCALC Lab Results  Component Value Date   TSH 0.955 09/01/2021    Therapeutic Level Labs: No results found for: LITHIUM No results found for: VALPROATE No components found for:  CBMZ  Current Medications: Current Outpatient Medications  Medication Sig Dispense Refill   alendronate (FOSAMAX) 70 MG tablet TAKE 1 TABLET BY MOUTH ONCE WEEKLY 30 MINUTES BEFORE FIRST FOOD, MEDICINE, OR WATER OF THE DAY     Calcium Carb-Cholecalciferol 600-10 MG-MCG TABS Take 1 tablet by mouth daily.     hydrOXYzine (ATARAX/VISTARIL) 25 MG tablet Take 25 mg by mouth daily.     lamoTRIgine (LAMICTAL) 200 MG tablet Take 1 tablet (200 mg total) by mouth 2 (two) times daily. 180 tablet 0   mirtazapine (REMERON) 7.5 MG tablet Take 1 tablet (7.5 mg total) by mouth at bedtime. For sleep and mood 90 tablet 0   Multiple Vitamin (MULTIVITAMIN) tablet Take 1 tablet by mouth daily.     QUEtiapine (SEROQUEL XR) 200 MG 24 hr tablet Take 1 tablet (200 mg total) by mouth at bedtime. 30 tablet 1   simvastatin (ZOCOR) 40 MG tablet Take 40 mg by mouth daily.     No current facility-administered medications for this visit.     Musculoskeletal: Strength & Muscle Tone: within normal limits Gait & Station: normal Patient leans: N/A  Psychiatric Specialty Exam: Review of Systems  Constitutional:  Positive for appetite change (increased).  Psychiatric/Behavioral:  The patient is nervous/anxious.   All other systems reviewed and are negative.  Blood pressure 139/86, pulse 82, temperature 98.1 F (36.7 C), temperature source Temporal, weight 148 lb 9.6 oz (67.4 kg).Body mass index is 24.35 kg/m.  General Appearance: Casual  Eye Contact:  Fair  Speech:  Clear and Coherent  Volume:  Normal  Mood:  Anxious  Affect:  Congruent  Thought Process:  Goal Directed and Descriptions of Associations: Intact  Orientation:  Full (Time, Place, and Person)  Thought Content: Logical   Suicidal Thoughts:  No  Homicidal Thoughts:  No   Memory:  Immediate;   Fair Recent;   Fair Remote;   Fair  Judgement:  Fair  Insight:  Fair  Psychomotor Activity:  Normal  Concentration:  Concentration: Fair and Attention Span: Fair  Recall:  [employer] of Knowledge: Fair  Language: Fair  Akathisia:  No  Handed:  Right  AIMS (if indicated): done  Assets:  Communication Skills Desire for Improvement Housing Social Support  ADL's:  Intact  Cognition: WNL  Sleep:  Fair   Screenings: GAD-7    Flowsheet Row Office Visit from 09/23/2021 in [facility]  Total GAD-7 Score 8      PHQ2-9    Flowsheet Row Office Visit from 11/17/2021 in [facility] Office Visit from 09/23/2021 in [facility] Office Visit from 09/01/2021 in [facility]  PHQ-2 Total Score 0 1 2  PHQ-9 Total Score 7 7 11       Flowsheet Row Office Visit from 11/17/2021 in [facility]  Office Visit from 09/23/2021 in [facility] Office Visit from 09/01/2021 in [facility]  C-SSRS RISK CATEGORY No Risk No Risk No Risk        Assessment and Plan: [name] is a 61 year old Caucasian female, married, on disability, [place], has a history of bipolar disorder type II, GAD, multiple medical problems, obstructive sleep apnea on CPAP was evaluated in office today.  Patient is currently struggling with adverse side effects to her medications like Seroquel, will benefit from the following plan.  Plan Bipolar disorder type II in full remission Review Seroquel extended release to 200 mg p.o. nightly-dose reduced due to side effects. Lamotrigine 200 mg p.o. twice daily Mirtazapine 7.5 mg p.o. nightly  GAD-improving Continue psych[facility] sessions with [facility] Continue Remeron 7.5 mg p.o.  nightly  Insomnia-stable Mirtazapine 7.5 mg p.o. nightly Obstructive sleep apnea compliant on CPAP  Follow-up in clinic in 3 to 4 weeks or sooner if needed.  This note was generated in part or whole with voice recognition software. Voice recognition is usually quite accurate but there are transcription errors that can and very often do occur. I apologize for any typographical errors that were not detected and corrected.      Jomarie LongsSaramma Miyanna Wiersma, MD 11/17/2021, 3:45 PM

## 2021-12-06 DIAGNOSIS — F3181 Bipolar II disorder: Secondary | ICD-10-CM | POA: Diagnosis not present

## 2021-12-08 DIAGNOSIS — G4733 Obstructive sleep apnea (adult) (pediatric): Secondary | ICD-10-CM | POA: Diagnosis not present

## 2021-12-15 ENCOUNTER — Encounter: Payer: Self-pay | Admitting: Psychiatry

## 2021-12-15 ENCOUNTER — Telehealth (INDEPENDENT_AMBULATORY_CARE_PROVIDER_SITE_OTHER): Payer: Medicare Other | Admitting: Psychiatry

## 2021-12-15 ENCOUNTER — Other Ambulatory Visit: Payer: Self-pay

## 2021-12-15 DIAGNOSIS — F3176 Bipolar disorder, in full remission, most recent episode depressed: Secondary | ICD-10-CM | POA: Diagnosis not present

## 2021-12-15 DIAGNOSIS — F411 Generalized anxiety disorder: Secondary | ICD-10-CM | POA: Diagnosis not present

## 2021-12-15 DIAGNOSIS — F5101 Primary insomnia: Secondary | ICD-10-CM | POA: Diagnosis not present

## 2021-12-15 MED ORDER — QUETIAPINE FUMARATE ER 150 MG PO TB24: 150.0000 mg | ORAL_TABLET | Freq: Every day | ORAL | 1 refills | Status: DC

## 2021-12-15 MED ORDER — LAMOTRIGINE 200 MG PO TABS: 200.0000 mg | ORAL_TABLET | Freq: Two times a day (BID) | ORAL | 1 refills | Status: DC

## 2021-12-15 MED ORDER — MIRTAZAPINE 7.5 MG PO TABS: 7.5000 mg | ORAL_TABLET | Freq: Every day | ORAL | 1 refills | Status: DC

## 2021-12-15 NOTE — Progress Notes (Signed)
Virtual Visit via Video Note  I connected with [name] on 12/15/21 at  2:30 PM EST by a video enabled telemedicine application and verified that I am speaking with the correct person using two identifiers.  Location Provider Location : [facility] Patient Location : Home  Participants: Patient , Provider    I discussed the limitations of evaluation and management by telemedicine and the availability of in person appointments. The patient expressed understanding and agreed to proceed.    I discussed the assessment and treatment plan with the patient. The patient was provided an opportunity to ask questions and all were answered. The patient agreed with the plan and demonstrated an understanding of the instructions.   The patient was advised to call back or seek an in-person evaluation if the symptoms worsen or if the condition fails to improve as anticipated.   BH MD OP Progress Note  12/15/2021 2:55 PM [name]  MRN:  [id number]  Chief Complaint:  Chief Complaint  Patient presents with   Follow-up: 61 year old Caucasian female who has a history of bipolar disorder type II, generalized anxiety disorder, insomnia, was evaluated for medication management.   HPI: [name] is a 61 year old Caucasian female on disability, married, currently lives at [facility], has a history of bipolar disorder type II, generalized anxiety disorder, hyperlipidemia, osteoporosis, obstructive sleep apnea on CPAP, history of cataract surgery was evaluated by telemedicine today.  Patient today reports mood symptoms are currently stable.  Denies any significant mood lability.  Does have some anxiety symptoms on and off however overall coping well.  Continues to follow-up with her therapist-Dr. Olin Pia on a monthly basis and reports it as beneficial.  Currently taking the lower dosage of Seroquel 200 mg at bedtime.  She continues to feel groggy when she wakes up in the morning  although that has improved.  She is interested in further dosage reduction.  Continues to be compliant on her Lamictal, mirtazapine.  Denies side effects.  Celebrated her birthday yesterday and had a good time.  Husband continues to be supportive.   Denies any other concerns today.  Visit Diagnosis:    ICD-10-CM   1. Bipolar disorder, in full remission, most recent episode depressed (HCC)  F31.76 QUEtiapine Fumarate (SEROQUEL XR) 150 MG 24 hr tablet   Type 2    2. GAD (generalized anxiety disorder)  F41.1 mirtazapine (REMERON) 7.5 MG tablet    3. Primary insomnia  F51.01 lamoTRIgine (LAMICTAL) 200 MG tablet      Past Psychiatric History: Reviewed past psychiatric history from progress note on 09/01/2021.  Past trials of lithium-helpful however had side effects of nausea, Ativan-made symptoms worse, Depakote-gained a lot of weight and stopped for ECT treatments, Trileptal, Latuda-akathisia, Celexa, Effexor-made her manic, trazodone, Tranxene, Zoloft, Prozac, BuSpar-not helpful, lamotrigine-restlessness, quetiapine, Abilify, TCA.  Past Medical History:  Past Medical History:  Diagnosis Date   Bipolar 1 disorder (HCC)    Hyperlipidemia    OSA (obstructive sleep apnea)    CPAP dependent   Osteoporosis    PPD positive     Past Surgical History:  Procedure Laterality Date   ABDOMINAL HYSTERECTOMY     CATARACT EXTRACTION, BILATERAL     due to seroquel   ROBOTIC ASSISTED LAPAROSCOPIC HYSTERECTOMY AND SALPINGECTOMY      Family Psychiatric History: Reviewed family psychiatric history from progress note on 09/01/2021.  Family History:  Family History  Problem Relation Age of Onset   Personality disorder Mother  Alcohol abuse Maternal Uncle    Bipolar disorder Cousin    Schizophrenia Other     Social History: Reviewed social history from progress note on 09/01/2021. Social History   Socioeconomic History   Marital status: Married    Spouse name: [name]   Number of children: 2    Years of education: Not on file   Highest education level: Bachelor's degree (e.g., BA, AB, BS)  Occupational History    Comment: unemployed  Tobacco Use   Smoking status: Never   Smokeless tobacco: Never  Vaping Use   Vaping Use: Never used  Substance and Sexual Activity   Alcohol use: No   Drug use: No   Sexual activity: Yes  Other Topics Concern   Not on file  Social History Narrative   Not on file   Social Determinants of Health   Financial Resource Strain: Not on file  Food Insecurity: Not on file  Transportation Needs: Not on file  Physical Activity: Not on file  Stress: Not on file  Social Connections: Not on file    Allergies:  Allergies  Allergen Reactions   Tyloxapol Rash   Prilosec [Omeprazole]     Interacts with psych meds   Tylox [Oxycodone-Acetaminophen] Rash    Metabolic Disorder Labs: No results found for: HGBA1C, MPG Lab Results  Component Value Date   PROLACTIN 5.1 09/01/2021   No results found for: CHOL, TRIG, HDL, CHOLHDL, VLDL, LDLCALC Lab Results  Component Value Date   TSH 0.955 09/01/2021    Therapeutic Level Labs: No results found for: LITHIUM No results found for: VALPROATE No components found for:  CBMZ  Current Medications: Current Outpatient Medications  Medication Sig Dispense Refill   QUEtiapine Fumarate (SEROQUEL XR) 150 MG 24 hr tablet Take 1 tablet (150 mg total) by mouth at bedtime. 30 tablet 1   alendronate (FOSAMAX) 70 MG tablet TAKE 1 TABLET BY MOUTH ONCE WEEKLY 30 MINUTES BEFORE FIRST FOOD, MEDICINE, OR WATER OF THE DAY     Calcium Carb-Cholecalciferol 600-10 MG-MCG TABS Take 1 tablet by mouth daily.     hydrOXYzine (ATARAX/VISTARIL) 25 MG tablet Take 25 mg by mouth daily.     lamoTRIgine (LAMICTAL) 200 MG tablet Take 1 tablet (200 mg total) by mouth 2 (two) times daily. 180 tablet 1   mirtazapine (REMERON) 7.5 MG tablet Take 1 tablet (7.5 mg total) by mouth at bedtime. For sleep and mood 90 tablet 1   Multiple  Vitamin (MULTIVITAMIN) tablet Take 1 tablet by mouth daily.     simvastatin (ZOCOR) 40 MG tablet Take 40 mg by mouth daily.     No current facility-administered medications for this visit.     Musculoskeletal: Strength & Muscle Tone:  UTA Gait & Station:  Seated Patient leans: N/A  Psychiatric Specialty Exam: Review of Systems  Psychiatric/Behavioral:  Positive for sleep disturbance. The patient is nervous/anxious.   All other systems reviewed and are negative.  There were no vitals taken for this visit.There is no height or weight on file to calculate BMI.  General Appearance: Casual  Eye Contact:  Fair  Speech:  Normal Rate  Volume:  Normal  Mood:  Anxious  Affect:  Full Range  Thought Process:  Goal Directed and Descriptions of Associations: Intact  Orientation:  Full (Time, Place, and Person)  Thought Content: Logical   Suicidal Thoughts:  No  Homicidal Thoughts:  No  Memory:  Immediate;   Fair Recent;   Fair Remote;   Fair  Judgement:  Fair  Insight:  Fair  Psychomotor Activity:  Normal  Concentration:  Concentration: Fair and Attention Span: Fair  Recall:  [employer] of Knowledge: Fair  Language: Fair  Akathisia:  No  Handed:  Right  AIMS (if indicated): done, 0  Assets:  Communication Skills Desire for Improvement Housing Intimacy Social Support  ADL's:  Intact  Cognition: WNL  Sleep:   Improving however does have grogginess in the morning.   Screenings: GAD-7    Flowsheet Row Video Visit from 12/15/2021 in [facility] Office Visit from 09/23/2021 in [facility]  Total GAD-7 Score 6 8      PHQ2-9    Flowsheet Row Video Visit from 12/15/2021 in [facility] Office Visit from 11/17/2021 in [facility] Office Visit from 09/23/2021 in [facility] Office Visit from 09/01/2021 in [facility]   PHQ-2 Total Score 0 0 1 2  PHQ-9 Total Score -- 7 7 11       Flowsheet Row Video Visit from 12/15/2021 in [facility] Office Visit from 11/17/2021 in [facility] Office Visit from 09/23/2021 in [facility]  C-SSRS RISK CATEGORY No Risk No Risk No Risk        Assessment and Plan: OLAYINKA GATHERS is a 61 year old Caucasian female, married, on disability, lives at [facility], has a history of bipolar disorder type II, GAD, multiple medical problems, obstructive sleep apnea on CPAP was evaluated by telemedicine today.  Patient is currently improving on the current dose reduction of Seroquel however continues to have adverse side effects of grogginess, will benefit from further medication readjustment.  Plan as noted below.   Bipolar disorder type II in full remission Continue to reduce Seroquel extended release to 150 mg p.o. nightly to address her side effects of grogginess. Lamotrigine 200 mg p.o. twice daily Mirtazapine 7.5 mg p.o. nightly.  GAD-improving Continue [facility] sessions [facility]. Mirtazapine 7.5 mg p.o. nightly  Insomnia-improving Mirtazapine 7.5 mg p.o. nightly Obstructive sleep apnea compliant on CPAP  Follow-up in clinic in 4 weeks or sooner if needed.   Collaboration of Care: Collaboration of Care: Referral or follow-up with counselor/therapist AEB patient encouraged to continue to follow up with therapist. Patient/Guardian was advised Release of Information must be obtained prior to any record release in order to collaborate their care with an outside provider. Patient/Guardian was advised if they have not already done so to contact the registration department to sign all necessary forms in order for [facility] to release information regarding their care.   Consent: Patient/Guardian gives verbal consent for treatment and assignment of benefits  for services provided during this visit. Patient/Guardian expressed understanding and agreed to proceed.   This note was generated in part or whole with voice recognition software. Voice recognition is usually quite accurate but there are transcription errors that can and very often do occur. I apologize for any typographical errors that were not detected and corrected.      [place], MD 12/15/2021, 2:55 PM

## 2021-12-17 DIAGNOSIS — K219 Gastro-esophageal reflux disease without esophagitis: Secondary | ICD-10-CM | POA: Diagnosis not present

## 2021-12-17 DIAGNOSIS — R7303 Prediabetes: Secondary | ICD-10-CM | POA: Diagnosis not present

## 2021-12-17 DIAGNOSIS — M810 Age-related osteoporosis without current pathological fracture: Secondary | ICD-10-CM | POA: Diagnosis not present

## 2021-12-17 DIAGNOSIS — Z0000 Encounter for general adult medical examination without abnormal findings: Secondary | ICD-10-CM | POA: Diagnosis not present

## 2021-12-17 DIAGNOSIS — F324 Major depressive disorder, single episode, in partial remission: Secondary | ICD-10-CM | POA: Diagnosis not present

## 2021-12-17 DIAGNOSIS — G4733 Obstructive sleep apnea (adult) (pediatric): Secondary | ICD-10-CM | POA: Diagnosis not present

## 2021-12-17 DIAGNOSIS — E785 Hyperlipidemia, unspecified: Secondary | ICD-10-CM | POA: Diagnosis not present

## 2021-12-29 DIAGNOSIS — G4733 Obstructive sleep apnea (adult) (pediatric): Secondary | ICD-10-CM | POA: Diagnosis not present

## 2022-01-03 DIAGNOSIS — F3181 Bipolar II disorder: Secondary | ICD-10-CM | POA: Diagnosis not present

## 2022-01-05 DIAGNOSIS — G4733 Obstructive sleep apnea (adult) (pediatric): Secondary | ICD-10-CM | POA: Diagnosis not present

## 2022-01-17 ENCOUNTER — Other Ambulatory Visit: Payer: Self-pay

## 2022-01-17 ENCOUNTER — Encounter: Payer: Self-pay | Admitting: Psychiatry

## 2022-01-17 ENCOUNTER — Telehealth (INDEPENDENT_AMBULATORY_CARE_PROVIDER_SITE_OTHER): Payer: Medicare Other | Admitting: Psychiatry

## 2022-01-17 DIAGNOSIS — F3181 Bipolar II disorder: Secondary | ICD-10-CM | POA: Diagnosis not present

## 2022-01-17 DIAGNOSIS — F5101 Primary insomnia: Secondary | ICD-10-CM

## 2022-01-17 DIAGNOSIS — F411 Generalized anxiety disorder: Secondary | ICD-10-CM

## 2022-01-17 DIAGNOSIS — R7303 Prediabetes: Secondary | ICD-10-CM | POA: Insufficient documentation

## 2022-01-17 MED ORDER — QUETIAPINE FUMARATE ER 200 MG PO TB24: 200.0000 mg | ORAL_TABLET | Freq: Every day | ORAL | 0 refills | Status: DC

## 2022-01-17 NOTE — Progress Notes (Signed)
Virtual Visit via Video Note ? ?I connected with [name] on 01/17/22 at 10:20 AM EDT by a video enabled telemedicine application and verified that I am speaking with the correct person using two identifiers. ? ?Location ?Provider Location : [facility] ?Patient Location : Home ? ?Participants: Patient , Provider ?  ?I discussed the limitations of evaluation and management by telemedicine and the availability of in person appointments. The patient expressed understanding and agreed to proceed. ? ?  ?I discussed the assessment and treatment plan with the patient. The patient was provided an opportunity to ask questions and all were answered. The patient agreed with the plan and demonstrated an understanding of the instructions. ?  ?The patient was advised to call back or seek an in-person evaluation if the symptoms worsen or if the condition fails to improve as anticipated. ? ? ?BH MD OP Progress Note ? ?01/17/2022 12:59 PM ?[name]  ?MRN:  [id number] ? ?Chief Complaint:  ?Chief Complaint  ?Patient presents with  ? Follow-up: 61 year old Caucasian female who has a history of bipolar disorder type II, generalized anxiety disorder, insomnia was evaluated for medication management.  ? ?HPI: [name] is a 61 year old Caucasian female on disability, married, currently lives at [facility], has a history of bipolar disorder type II, generalized anxiety disorder, hyperlipidemia, osteoporosis, obstructive sleep apnea on CPAP, history of cataract surgery was evaluated by telemedicine today. ? ?Patient today reports since the past 2 weeks she has been getting more depressed.  She reports she struggles with anhedonia, feeling down, depressed, hopeless, feels like crying all the time however has not cried yet, and feels tired all the time.  Patient reports she has no psychosocial stressors which could have triggered this.  The only change has been going down on the Seroquel last visit.  Likely this  could have triggered her episode. ? ?Patient continues to have anxiety although it is manageable. ? ?Patient denies any suicidality, homicidality or perceptual disturbances. ? ?Patient continues to follow-up with her therapist.  Reports therapy sessions are beneficial. ? ?Patient reports sleep is good. ? ?Patient denies any side effects to medications and reports she is compliant. ? ?Denies any other concerns today. ? ?Visit Diagnosis:  ?  ICD-10-CM   ?1. Bipolar 2 disorder, major depressive episode (HCC)  F31.81 QUEtiapine (SEROQUEL XR) 200 MG 24 hr tablet  ?  ?2. GAD (generalized anxiety disorder)  F41.1 QUEtiapine (SEROQUEL XR) 200 MG 24 hr tablet  ?  ?3. Primary insomnia  F51.01 QUEtiapine (SEROQUEL XR) 200 MG 24 hr tablet  ?  ? ? ?Past Psychiatric History: Reviewed past psychiatric history from progress note on 09/01/2021.  Past trials of lithium-helpful however had side effects of nausea, Ativan-made symptoms worse, Depakote-gained a lot of weight and stopped for ECT treatments, Trileptal, Latuda-akathisia, Celexa, Effexor-made her manic, trazodone, Tranxene, Zoloft, Prozac, BuSpar-not helpful, lamotrigine-restlessness, quetiapine, Abilify , TCA. ? ?Past Medical History:  ?Past Medical History:  ?Diagnosis Date  ? Bipolar 1 disorder (HCC)   ? Hyperlipidemia   ? OSA (obstructive sleep apnea)   ? CPAP dependent  ? Osteoporosis   ? PPD positive   ?  ?Past Surgical History:  ?Procedure Laterality Date  ? ABDOMINAL HYSTERECTOMY    ? CATARACT EXTRACTION, BILATERAL    ? due to seroquel  ? ROBOTIC ASSISTED LAPAROSCOPIC HYSTERECTOMY AND SALPINGECTOMY    ? ? ?Family Psychiatric History: Reviewed family psychiatric history from progress note on 09/01/2021. ? ?Family History:  ?Family History  ?  Problem Relation Age of Onset  ? Personality disorder Mother   ? Alcohol abuse Maternal Uncle   ? Bipolar disorder Cousin   ? Schizophrenia Other   ? ? ?Social History: Reviewed social history from progress note on 09/01/2021. ?Social  History  ? ?Socioeconomic History  ? Marital status: Married  ?  Spouse name: eric  ? Number of children: 2  ? Years of education: Not on file  ? Highest education level: Bachelor's degree (e.g., BA, AB, BS)  ?Occupational History  ?  Comment: unemployed  ?Tobacco Use  ? Smoking status: Never  ? Smokeless tobacco: Never  ?Vaping Use  ? Vaping Use: Never used  ?Substance and Sexual Activity  ? Alcohol use: No  ? Drug use: No  ? Sexual activity: Yes  ?Other Topics Concern  ? Not on file  ?Social History Narrative  ? Not on file  ? ?Social Determinants of Health  ? ?Financial Resource Strain: Not on file  ?Food Insecurity: Not on file  ?Transportation Needs: Not on file  ?Physical Activity: Not on file  ?Stress: Not on file  ?Social Connections: Not on file  ? ? ?Allergies:  ?Allergies  ?Allergen Reactions  ? Tyloxapol Rash  ? Prilosec [Omeprazole]   ?  Interacts with psych meds  ? Tylox [Oxycodone-Acetaminophen] Rash  ? ? ?Metabolic Disorder Labs: ?No results found for: HGBA1C, MPG ?Lab Results  ?Component Value Date  ? PROLACTIN 5.1 09/01/2021  ? ?No results found for: CHOL, TRIG, HDL, CHOLHDL, VLDL, LDLCALC ?Lab Results  ?Component Value Date  ? TSH 0.955 09/01/2021  ? ? ?Therapeutic Level Labs: ?No results found for: LITHIUM ?No results found for: VALPROATE ?No components found for:  CBMZ ? ?Current Medications: ?Current Outpatient Medications  ?Medication Sig Dispense Refill  ? QUEtiapine (SEROQUEL XR) 200 MG 24 hr tablet Take 1 tablet (200 mg total) by mouth at bedtime. 90 tablet 0  ? alendronate (FOSAMAX) 70 MG tablet TAKE 1 TABLET BY MOUTH ONCE WEEKLY 30 MINUTES BEFORE FIRST FOOD, MEDICINE, OR WATER OF THE DAY    ? Calcium Carb-Cholecalciferol 600-10 MG-MCG TABS Take 1 tablet by mouth daily.    ? hydrOXYzine (ATARAX/VISTARIL) 25 MG tablet Take 25 mg by mouth daily.    ? lamoTRIgine (LAMICTAL) 200 MG tablet Take 1 tablet (200 mg total) by mouth 2 (two) times daily. 180 tablet 1  ? mirtazapine (REMERON) 7.5 MG  tablet Take 1 tablet (7.5 mg total) by mouth at bedtime. For sleep and mood 90 tablet 1  ? Multiple Vitamin (MULTIVITAMIN) tablet Take 1 tablet by mouth daily.    ? simvastatin (ZOCOR) 40 MG tablet Take 40 mg by mouth daily.    ? ?No current facility-administered medications for this visit.  ? ? ? ?Musculoskeletal: ?Strength & Muscle Tone:  UTA ?Gait & Station:  Seated ?Patient leans: N/A ? ?Psychiatric Specialty Exam: ?Review of Systems  ?Constitutional:  Positive for fatigue.  ?Psychiatric/Behavioral:  Positive for dysphoric mood.   ?All other systems reviewed and are negative.  ?There were no vitals taken for this visit.There is no height or weight on file to calculate BMI.  ?General Appearance: Casual  ?Eye Contact:  Fair  ?Speech:  Normal Rate  ?Volume:  Normal  ?Mood:  Depressed  ?Affect:  Congruent  ?Thought Process:  Goal Directed and Descriptions of Associations: Intact  ?Orientation:  Full (Time, Place, and Person)  ?Thought Content: Logical   ?Suicidal Thoughts:  No  ?Homicidal Thoughts:  No  ?Memory:  Immediate;   Fair ?Recent;   Fair ?Remote;   Fair  ?Judgement:  Fair  ?Insight:  Fair  ?Psychomotor Activity:  Normal  ?Concentration:  Concentration: Fair and Attention Span: Fair  ?Recall:  Fair  ?Fund of Knowledge: Fair  ?Language: Fair  ?Akathisia:  No  ?Handed:  Right  ?AIMS (if indicated): done  ?Assets:  Communication Skills ?Desire for Improvement ?Housing ?Intimacy ?Social Support  ?ADL's:  Intact  ?Cognition: WNL  ?Sleep:   Fair  ? ?Screenings: ?AIMS   ? ?Flowsheet Row Video [facility]  ?AIMS Total Score 0  ? ?  ? ?GAD-7   ? ?Flowsheet Row Video Visit [facility]15[facility]2021 in [facility] Office Visit from [facility]/23/2021 in [facility]  ?Total GAD-7 Score 6 8  ? ?  ? ?PHQ2-9   ? ?Flowsheet Row [facility] Video Visit from 12[facility]15[facility]2021 in  [facility] Office Visit from [facility]/17[facility]2021 [facility] Visit from 09/23/2021 in [facility] Office Visit from 11/9/202

## 2022-02-01 DIAGNOSIS — F3181 Bipolar II disorder: Secondary | ICD-10-CM | POA: Diagnosis not present

## 2022-02-08 ENCOUNTER — Encounter: Payer: Self-pay | Admitting: Psychiatry

## 2022-02-08 ENCOUNTER — Telehealth (INDEPENDENT_AMBULATORY_CARE_PROVIDER_SITE_OTHER): Payer: Medicare Other | Admitting: Psychiatry

## 2022-02-08 DIAGNOSIS — F3181 Bipolar II disorder: Secondary | ICD-10-CM

## 2022-02-08 DIAGNOSIS — F5101 Primary insomnia: Secondary | ICD-10-CM | POA: Diagnosis not present

## 2022-02-08 DIAGNOSIS — F411 Generalized anxiety disorder: Secondary | ICD-10-CM | POA: Diagnosis not present

## 2022-02-08 MED ORDER — QUETIAPINE FUMARATE ER 300 MG PO TB24: 300.0000 mg | ORAL_TABLET | Freq: Every day | ORAL | 0 refills | Status: DC

## 2022-02-08 NOTE — Progress Notes (Signed)
Virtual Visit via Video Note ? ?I connected with [name] on 02/08/22 at  2:20 PM EDT by a video enabled telemedicine application and verified that I am speaking with the correct person using two identifiers. ? ?Location ?Provider Location : [facility] ?Patient Location : Home ? ?Participants: Patient , Provider ? ?  ?I discussed the limitations of evaluation and management by telemedicine and the availability of in person appointments. The patient expressed understanding and agreed to proceed. ?  ?I discussed the assessment and treatment plan with the patient. The patient was provided an opportunity to ask questions and all were answered. The patient agreed with the plan and demonstrated an understanding of the instructions. ?  ?The patient was advised to call back or seek an in-person evaluation if the symptoms worsen or if the condition fails to improve as anticipated. ? ? ?BH MD OP Progress Note ? ?02/08/2022 3:41 PM ?[name]  ?MRN:  [id number] ? ?Chief Complaint:  ?Chief Complaint  ?Patient presents with  ? Follow-up: 61 year old Caucasian female who has a history of bipolar disorder type II, generalized anxiety disorder, insomnia currently with worsening mood swings, sleep problems, presented for medication management.  ? ?HPI: [name] is a 61 year old Caucasian female on disability, married, currently lives at [facility], has a history of bipolar disorder type II, generalized anxiety disorder, hyperlipidemia, osteoporosis, obstructive sleep apnea on CPAP, history of cataract surgery was evaluated by telemedicine today. ? ?Patient reports in spite of going up on the Seroquel recently-01/17/2022-she continues to have episodes of mood swings.  Patient reports she goes through episodes when she is able to function okay for a short period and then she goes into a few days of feeling sad, low energy, anhedonia, low motivation.  She tries to take care of her chores around the home when  she feels good that way it does not affect her too much when she is down and depressed. ? ?Patient reports sleep is also restless.  She needs the Seroquel as well as the mirtazapine to fall asleep. ? ?Patient denies any suicidality, homicidality or perceptual disturbances. ? ?Agreeable to dosage increase of Seroquel back to 300 mg. ? ?Patient denies any other concerns today. ? ?Visit Diagnosis:  ?  ICD-10-CM   ?1. Bipolar 2 disorder, major depressive episode (HCC)  F31.81 QUEtiapine (SEROQUEL XR) 300 MG 24 hr tablet  ?  ?2. GAD (generalized anxiety disorder)  F41.1   ?  ?3. Primary insomnia  F51.01   ?  ? ? ?Past Psychiatric History: Reviewed past psychiatric history from progress note on 09/01/2021.  Past trials of lithium-had side effects, Ativan-made symptoms worse, Depakote-gained weight and stopped for ECT treatments, Trileptal, Latuda-akathisia, Celexa, Effexor-made her manic, trazodone,tranxene, Zoloft, Prozac, BuSpar-not helpful, lamotrigine-restlessness, quetiapine, Abilify, TCA. ? ?Past Medical History:  ?Past Medical History:  ?Diagnosis Date  ? Bipolar 1 disorder (HCC)   ? Hyperlipidemia   ? OSA (obstructive sleep apnea)   ? CPAP dependent  ? Osteoporosis   ? PPD positive   ?  ?Past Surgical History:  ?Procedure Laterality Date  ? ABDOMINAL HYSTERECTOMY    ? CATARACT EXTRACTION, BILATERAL    ? due to seroquel  ? ROBOTIC ASSISTED LAPAROSCOPIC HYSTERECTOMY AND SALPINGECTOMY    ? ? ?Family Psychiatric History: Reviewed family psychiatric history from progress note on 09/01/2021. ? ?Family History:  ?Family History  ?Problem Relation Age of Onset  ? Personality disorder Mother   ? Alcohol abuse Maternal Uncle   ?  Bipolar disorder Cousin   ? Schizophrenia Other   ? ? ?Social History: Reviewed social history from progress note on 09/01/2021. ?Social History  ? ?Socioeconomic History  ? Marital status: Married  ?  Spouse name: eric  ? Number of children: 2  ? Years of education: Not on file  ? Highest education  level: Bachelor's degree (e.g., BA, AB, BS)  ?Occupational History  ?  Comment: unemployed  ?Tobacco Use  ? Smoking status: Never  ? Smokeless tobacco: Never  ?Vaping Use  ? Vaping Use: Never used  ?Substance and Sexual Activity  ? Alcohol use: No  ? Drug use: No  ? Sexual activity: Yes  ?Other Topics Concern  ? Not on file  ?Social History Narrative  ? Not on file  ? ?Social Determinants of Health  ? ?Financial Resource Strain: Not on file  ?Food Insecurity: Not on file  ?Transportation Needs: Not on file  ?Physical Activity: Not on file  ?Stress: Not on file  ?Social Connections: Not on file  ? ? ?Allergies:  ?Allergies  ?Allergen Reactions  ? Tyloxapol Rash  ? Prilosec [Omeprazole]   ?  Interacts with psych meds  ? Tylox [Oxycodone-Acetaminophen] Rash  ? ? ?Metabolic Disorder Labs: ?No results found for: HGBA1C, MPG ?Lab Results  ?Component Value Date  ? PROLACTIN 5.1 09/01/2021  ? ?No results found for: CHOL, TRIG, HDL, CHOLHDL, VLDL, LDLCALC ?Lab Results  ?Component Value Date  ? TSH 0.955 09/01/2021  ? ? ?Therapeutic Level Labs: ?No results found for: LITHIUM ?No results found for: VALPROATE ?No components found for:  CBMZ ? ?Current Medications: ?Current Outpatient Medications  ?Medication Sig Dispense Refill  ? QUEtiapine (SEROQUEL XR) 300 MG 24 hr tablet Take 1 tablet (300 mg total) by mouth at bedtime. 30 tablet 0  ? alendronate (FOSAMAX) 70 MG tablet TAKE 1 TABLET BY MOUTH ONCE WEEKLY 30 MINUTES BEFORE FIRST FOOD, MEDICINE, OR WATER OF THE DAY    ? Calcium Carb-Cholecalciferol 600-10 MG-MCG TABS Take 1 tablet by mouth daily.    ? hydrOXYzine (ATARAX/VISTARIL) 25 MG tablet Take 25 mg by mouth daily.    ? lamoTRIgine (LAMICTAL) 200 MG tablet Take 1 tablet (200 mg total) by mouth 2 (two) times daily. 180 tablet 1  ? mirtazapine (REMERON) 7.5 MG tablet Take 1 tablet (7.5 mg total) by mouth at bedtime. For sleep and mood 90 tablet 1  ? Multiple Vitamin (MULTIVITAMIN) tablet Take 1 tablet by mouth daily.    ?  simvastatin (ZOCOR) 40 MG tablet Take 40 mg by mouth daily.    ? ?No current facility-administered medications for this visit.  ? ? ? ?Musculoskeletal: ?Strength & Muscle Tone:  UTA ?Gait & Station:  Seated ?Patient leans: N/A ? ?Psychiatric Specialty Exam: ?Review of Systems  ?Psychiatric/Behavioral:  Positive for decreased concentration, dysphoric mood and sleep disturbance. The patient is nervous/anxious.   ?     Mood swings  ?All other systems reviewed and are negative.  ?There were no vitals taken for this visit.There is no height or weight on file to calculate BMI.  ?General Appearance: Casual  ?Eye Contact:  Fair  ?Speech:  Clear and Coherent  ?Volume:  Normal  ?Mood:  Depressed and mood swings  ?Affect:  Depressed  ?Thought Process:  Goal Directed and Descriptions of Associations: Intact  ?Orientation:  Full (Time, Place, and Person)  ?Thought Content: Logical   ?Suicidal Thoughts:  No  ?Homicidal Thoughts:  No  ?Memory:  Immediate;   Fair ?Recent;  Fair ?Remote;   Fair  ?Judgement:  Fair  ?Insight:  Fair  ?Psychomotor Activity:  Normal  ?Concentration:  Concentration: Fair and Attention Span: Fair  ?Recall:  Fair  ?Fund of Knowledge: Fair  ?Language: Fair  ?Akathisia:  No  ?Handed:  Right  ?AIMS (if indicated): done,0  ?Assets:  Communication Skills ?Desire for Improvement ?Housing ?Intimacy ?Social Support  ?ADL's:  Intact  ?Cognition: WNL  ?Sleep:   restless  ? ?Screenings: ?AIMS   ? ?Flowsheet Row Video [facility]  ?AIMS Total Score 0  ? ?  ? ?GAD-7   ? ?Flowsheet Row Video Visit from 12/15/2021 in [facility] Office Visit from [facility]20[facility]  ?Total GAD-7 Score 6 8  ? ?  ? ?PHQ2-9   ? ?Flowsheet Row Video Visit from 01/17/2022 in [facility] Video Visit from [facility]/15/2021 [facility] Office Visit from 11/17/2021 in [facility] Office Visit from 09/23/2021 in [facility] Office Visit from [facility]01/2021 in [facility]  ?PHQ-2 Total Score 6 0 0 1 2  ?P

## 2022-02-16 ENCOUNTER — Encounter: Payer: Self-pay | Admitting: Psychiatry

## 2022-02-16 ENCOUNTER — Telehealth (INDEPENDENT_AMBULATORY_CARE_PROVIDER_SITE_OTHER): Payer: Medicare Other | Admitting: Psychiatry

## 2022-02-16 DIAGNOSIS — F3181 Bipolar II disorder: Secondary | ICD-10-CM

## 2022-02-16 DIAGNOSIS — F411 Generalized anxiety disorder: Secondary | ICD-10-CM

## 2022-02-16 DIAGNOSIS — F5101 Primary insomnia: Secondary | ICD-10-CM

## 2022-02-16 NOTE — Progress Notes (Signed)
Virtual Visit via Video Note ? ?I connected with [name] on 02/16/22 at 11:30 AM EDT by a video enabled telemedicine application and verified that I am speaking with the correct person using two identifiers. ? ?Location ?Provider Location : [facility] ?Patient Location : Home ? ?Participants: Patient , Provider ? ?  ?I discussed the limitations of evaluation and management by telemedicine and the availability of in person appointments. The patient expressed understanding and agreed to proceed. ? ?  ?I discussed the assessment and treatment plan with the patient. The patient was provided an opportunity to ask questions and all were answered. The patient agreed with the plan and demonstrated an understanding of the instructions. ?  ?The patient was advised to call back or seek an in-person evaluation if the symptoms worsen or if the condition fails to improve as anticipated. ? ?[place] MD OP Progress Note ? ?02/16/2022 12:18 PM ?[name]  ?MRN:  [id number] ? ?Chief Complaint:  ?Chief Complaint  ?Patient presents with  ? Follow-up: 61 year old Caucasian female who has a history of bipolar disorder type II, generalized anxiety disorder, insomnia, presented for medication management.  ? ?HPI: [name] is a 61 year old Caucasian female on disability, married, currently lives at [facility], has a history of bipolar disorder type II, GAD, hyperlipidemia, osteoporosis, obstructive sleep apnea on CPAP, history of cataract surgery was evaluated by telemedicine today. ? ?Patient today reports she has definitely noticed an improvement since being on the higher dosage of Seroquel.  She reports since the past 2 days her mood symptoms are more stable.  She does not have the ups and downs and she feels more active and motivated to do things. ? ?Patient reports sleep as improving.  The past couple of days she slept around 10 hours.  She does use her CPAP religiously. ? ?Continues to struggle with concentration  however would like to give it more time on the current dosage of medication. ? ?Patient currently compliant on her other medications. ? ?Patient denies any suicidality, homicidality or perceptual disturbances. ? ?Denies any other concerns today. ? ?Visit Diagnosis:  ?  ICD-10-CM   ?1. Bipolar 2 disorder, major depressive episode ([place])  F31.81   ?  ?2. GAD (generalized anxiety disorder)  F41.1   ?  ?3. Primary insomnia  F51.01   ?  ? ? ?Past Psychiatric History: Reviewed past psychiatric history from progress note on 09/01/2021.  Past trials of lithium-had side effects, Ativan-made symptoms worse, Depakote-gained weight and stopped for ECT treatments, Trileptal, Latuda-akathisia, Celexa, Effexor-made her manic, trazodone, Tranxene, Zoloft, Prozac, BuSpar-not helpful, lamotrigine-restlessness, quetiapine, Abilify, TCA ? ?Past Medical History:  ?Past Medical History:  ?Diagnosis Date  ? Bipolar 1 disorder ([place])   ? Hyperlipidemia   ? OSA (obstructive sleep apnea)   ? CPAP dependent  ? Osteoporosis   ? PPD positive   ?  ?Past Surgical History:  ?Procedure Laterality Date  ? ABDOMINAL HYSTERECTOMY    ? CATARACT EXTRACTION, BILATERAL    ? due to seroquel  ? ROBOTIC ASSISTED LAPAROSCOPIC HYSTERECTOMY AND SALPINGECTOMY    ? ? ?Family Psychiatric History: Reviewed family psychiatric history from progress note on 09/01/2021 ? ?Family History:  ?Family History  ?Problem Relation Age of Onset  ? Personality disorder Mother   ? Alcohol abuse Maternal Uncle   ? Bipolar disorder Cousin   ? Schizophrenia Other   ? ? ?Social History: Reviewed social history from progress note on 09/01/2021 ?Social History  ? ?Socioeconomic History  ?  Marital status: Married  ?  Spouse name: eric  ? Number of children: 2  ? Years of education: Not on file  ? Highest education level: Bachelor's degree (e.g., BA, AB, BS)  ?Occupational History  ?  Comment: unemployed  ?Tobacco Use  ? Smoking status: Never  ? Smokeless tobacco: Never  ?Vaping Use  ? Vaping  Use: Never used  ?Substance and Sexual Activity  ? Alcohol use: No  ? Drug use: No  ? Sexual activity: Yes  ?Other Topics Concern  ? Not on file  ?Social History Narrative  ? Not on file  ? ?Social Determinants of Health  ? ?Financial Resource Strain: Not on file  ?Food Insecurity: Not on file  ?Transportation Needs: Not on file  ?Physical Activity: Not on file  ?Stress: Not on file  ?Social Connections: Not on file  ? ? ?Allergies:  ?Allergies  ?Allergen Reactions  ? Tyloxapol Rash  ? Prilosec [Omeprazole]   ?  Interacts with psych meds  ? Tylox [Oxycodone-Acetaminophen] Rash  ? ? ?Metabolic Disorder Labs: ?No results found for: HGBA1C, MPG ?Lab Results  ?Component Value Date  ? PROLACTIN 5.1 09/01/2021  ? ?No results found for: CHOL, TRIG, HDL, CHOLHDL, VLDL, LDLCALC ?Lab Results  ?Component Value Date  ? TSH 0.955 09/01/2021  ? ? ?Therapeutic Level Labs: ?No results found for: LITHIUM ?No results found for: VALPROATE ?No components found for:  CBMZ ? ?Current Medications: ?Current Outpatient Medications  ?Medication Sig Dispense Refill  ? alendronate (FOSAMAX) 70 MG tablet TAKE 1 TABLET BY MOUTH ONCE WEEKLY 30 MINUTES BEFORE FIRST FOOD, MEDICINE, OR WATER OF THE DAY    ? Calcium Carb-Cholecalciferol 600-10 MG-MCG TABS Take 1 tablet by mouth daily.    ? hydrOXYzine (ATARAX/VISTARIL) 25 MG tablet Take 25 mg by mouth daily.    ? lamoTRIgine (LAMICTAL) 200 MG tablet Take 1 tablet (200 mg total) by mouth 2 (two) times daily. 180 tablet 1  ? mirtazapine (REMERON) 7.5 MG tablet Take 1 tablet (7.5 mg total) by mouth at bedtime. For sleep and mood 90 tablet 1  ? Multiple Vitamin (MULTIVITAMIN) tablet Take 1 tablet by mouth daily.    ? QUEtiapine (SEROQUEL XR) 300 MG 24 hr tablet Take 1 tablet (300 mg total) by mouth at bedtime. 30 tablet 0  ? simvastatin (ZOCOR) 40 MG tablet Take 40 mg by mouth daily.    ? ?No current facility-administered medications for this visit.  ? ? ? ?Musculoskeletal: ?Strength & Muscle Tone:   UTA ?Gait & Station:  Seated ?Patient leans: N/A ? ?Psychiatric Specialty Exam: ?Review of Systems  ?Psychiatric/Behavioral:  Positive for dysphoric mood. The patient is nervous/anxious.   ?All other systems reviewed and are negative.  ?There were no vitals taken for this visit.There is no height or weight on file to calculate BMI.  ?General Appearance: Casual  ?Eye Contact:  Fair  ?Speech:  Clear and Coherent  ?Volume:  Normal  ?Mood:  Anxious and Depressed  ?Affect:  Congruent  ?Thought Process:  Goal Directed and Descriptions of Associations: Intact  ?Orientation:  Full (Time, Place, and Person)  ?Thought Content: Logical   ?Suicidal Thoughts:  No  ?Homicidal Thoughts:  No  ?Memory:  Immediate;   Fair ?Recent;   Fair ?Remote;   Fair  ?Judgement:  Fair  ?Insight:  Fair  ?Psychomotor Activity:  Normal  ?Concentration:  Concentration: Fair and Attention Span: Fair  ?Recall:  Fair  ?Fund of Knowledge: Fair  ?Language: Fair  ?Akathisia:  No  ?Handed:  Right  ?AIMS (if indicated): done, 0  ?Assets:  Communication Skills ?Desire for Improvement ?Housing ?Intimacy ?Social Support ?Transportation  ?ADL's:  Intact  ?Cognition: WNL  ?Sleep:  Fair  ? ?Screenings: ?AIMS   ? ?Flowsheet Row Video Visit from 01/17/2022 in [place]  ?AIMS Total Score 0  ? ?  ? ?GAD-7   ? ?Flowsheet Row Video Visit from 12/15/2021 in [place] Office Visit from 09/23/2021 in [place]  ?Total GAD-7 Score 6 8  ? ?  ? ?PHQ2-9   ? ?Flowsheet Row Video Visit from 02/16/2022 in [place] Video Visit from 01/17/2022 in [place] Video Visit from 12/15/2021 in [place] Office Visit from 11/17/2021 in [place] Office Visit from 09/23/2021 in [place]  ?PHQ-2 Total Score 3 6 0 0 1  ?PHQ-9 Total Score 11 15 -- 7 7   ? ?  ? ?Flowsheet Row Video Visit from 01/17/2022 in [place] Video Visit from 12/15/2021 in [place] Office Visit from 11/17/2021 in [place]

## 2022-03-08 DIAGNOSIS — F3181 Bipolar II disorder: Secondary | ICD-10-CM | POA: Diagnosis not present

## 2022-03-16 ENCOUNTER — Encounter: Payer: Self-pay | Admitting: Psychiatry

## 2022-03-16 ENCOUNTER — Telehealth (INDEPENDENT_AMBULATORY_CARE_PROVIDER_SITE_OTHER): Payer: Medicare Other | Admitting: Psychiatry

## 2022-03-16 DIAGNOSIS — F411 Generalized anxiety disorder: Secondary | ICD-10-CM | POA: Diagnosis not present

## 2022-03-16 DIAGNOSIS — F3175 Bipolar disorder, in partial remission, most recent episode depressed: Secondary | ICD-10-CM | POA: Diagnosis not present

## 2022-03-16 DIAGNOSIS — F5101 Primary insomnia: Secondary | ICD-10-CM | POA: Diagnosis not present

## 2022-03-16 MED ORDER — HYDROXYZINE HCL 25 MG PO TABS: 25.0000 mg | ORAL_TABLET | Freq: Every evening | ORAL | 1 refills | Status: DC | PRN

## 2022-03-16 MED ORDER — QUETIAPINE FUMARATE ER 300 MG PO TB24: 300.0000 mg | ORAL_TABLET | Freq: Every day | ORAL | 0 refills | Status: DC

## 2022-03-16 NOTE — Progress Notes (Signed)
Virtual Visit via Video Note  I connected with [name] on 03/16/22 at  2:40 PM EDT by a video enabled telemedicine application and verified that I am speaking with the correct person using two identifiers.  Location Provider Location : [facility] Patient Location : Home  Participants: Patient , Provider  I discussed the limitations of evaluation and management by telemedicine and the availability of in person appointments. The patient expressed understanding and agreed to proceed.  I discussed the assessment and treatment plan with the patient. The patient was provided an opportunity to ask questions and all were answered. The patient agreed with the plan and demonstrated an understanding of the instructions.   The patient was advised to call back or seek an in-person evaluation if the symptoms worsen or if the condition fails to improve as anticipated.   BH MD OP Progress Note  03/16/2022 4:18 PM [name]  MRN:  [id number]  Chief Complaint:  Chief Complaint  Patient presents with   Follow-up: 61 year old Caucasian female with history of bipolar disorder type II, generalized anxiety disorder, insomnia, presented for medication management.   HPI: [name] is a 61 year old Caucasian female on disability, married, currently lives at [facility], has a history of bipolar disorder type II, GAD, hyperlipidemia, osteoporosis, obstructive sleep apnea on CPAP, history of cataract surgery was evaluated by telemedicine today.  Patient today reports she has noticed good improvement now that she is back on the Seroquel 300 mg.  She has not had any significant depressive symptoms like sadness, low motivation since the past few weeks.  Patient reports she has been motivating herself to get out of the house, has been walking her dog, has been spending at least 15 minutes every day out in the sun no matter what.  Patient reports sleep has improved.  She however has 3-4  nights a week when she still feels restless and is unable to fall asleep.  Patient reports she tries to fold laundry or do other things to relax her.  The other nights she is able to fall asleep as well as is able to maintain her sleep for 7 to 8 hours.  She does have hydroxyzine as needed available however she has not used it.  Agreeable to give it a trial.  Patient denies any suicidality, homicidality or perceptual disturbances.  Patient denies any other concerns today.  Visit Diagnosis:    ICD-10-CM   1. Bipolar disorder, in partial remission, most recent episode depressed (HCC)  F31.75 QUEtiapine (SEROQUEL XR) 300 MG 24 hr tablet   Type 2    2. GAD (generalized anxiety disorder)  F41.1     3. Primary insomnia  F51.01 hydrOXYzine (ATARAX) 25 MG tablet      Past Psychiatric History: Reviewed past psychiatric history from progress note on 09/01/2021.  Past trials of lithium-had side effects, Ativan-made symptoms worse, Depakote-gained weight and stopped for ECT treatments, Trileptal, Latuda-akathisia, Celexa, Effexor-made her manic, trazodone, Tranxene, Zoloft, Prozac, BuSpar-not helpful, lamotrigine-restlessness, quetiapine, Abilify, TCA.  Past Medical History:  Past Medical History:  Diagnosis Date   Bipolar 1 disorder (HCC)    Hyperlipidemia    OSA (obstructive sleep apnea)    CPAP dependent   Osteoporosis    PPD positive     Past Surgical History:  Procedure Laterality Date   ABDOMINAL HYSTERECTOMY     CATARACT EXTRACTION, BILATERAL     due to seroquel   ROBOTIC ASSISTED LAPAROSCOPIC HYSTERECTOMY AND SALPINGECTOMY  Family Psychiatric History: Reviewed family psychiatric history from progress note on 09/01/2021.  Family History:  Family History  Problem Relation Age of Onset   Personality disorder Mother    Alcohol abuse Maternal Uncle    Bipolar disorder Cousin    Schizophrenia Other     Social History: Reviewed social history from progress note on  09/01/2021. Social History   Socioeconomic History   Marital status: Married    Spouse name: [name]   Number of children: 2   Years of education: Not on file   Highest education level: Bachelor's degree (e.g., BA, AB, BS)  Occupational History    Comment: unemployed  Tobacco Use   Smoking status: Never   Smokeless tobacco: Never  Vaping Use   Vaping Use: Never used  Substance and Sexual Activity   Alcohol use: No   Drug use: No   Sexual activity: Yes  Other Topics Concern   Not on file  Social History Narrative   Not on file   Social Determinants of Health   Financial Resource Strain: Not on file  Food Insecurity: Not on file  Transportation Needs: Not on file  Physical Activity: Not on file  Stress: Not on file  Social Connections: Not on file    Allergies:  Allergies  Allergen Reactions   Tyloxapol Rash   Prilosec [Omeprazole]     Interacts with psych meds   Tylox [Oxycodone-Acetaminophen] Rash    Metabolic Disorder Labs: No results found for: HGBA1C, MPG Lab Results  Component Value Date   PROLACTIN 5.1 09/01/2021   No results found for: CHOL, TRIG, HDL, CHOLHDL, VLDL, LDLCALC Lab Results  Component Value Date   TSH 0.955 09/01/2021    Therapeutic Level Labs: No results found for: LITHIUM No results found for: VALPROATE No components found for:  CBMZ  Current Medications: Current Outpatient Medications  Medication Sig Dispense Refill   alendronate (FOSAMAX) 70 MG tablet TAKE 1 TABLET BY MOUTH ONCE WEEKLY 30 MINUTES BEFORE FIRST FOOD, MEDICINE, OR WATER OF THE DAY     Calcium Carb-Cholecalciferol 600-10 MG-MCG TABS Take 1 tablet by mouth daily.     hydrOXYzine (ATARAX) 25 MG tablet Take 1-2 tablets (25-50 mg total) by mouth at bedtime as needed. For sleep, anxiety 30 tablet 1   lamoTRIgine (LAMICTAL) 200 MG tablet Take 1 tablet (200 mg total) by mouth 2 (two) times daily. 180 tablet 1   mirtazapine (REMERON) 7.5 MG tablet Take 1 tablet (7.5 mg total)  by mouth at bedtime. For sleep and mood 90 tablet 1   Multiple Vitamin (MULTIVITAMIN) tablet Take 1 tablet by mouth daily.     QUEtiapine (SEROQUEL XR) 300 MG 24 hr tablet Take 1 tablet (300 mg total) by mouth at bedtime. 90 tablet 0   simvastatin (ZOCOR) 40 MG tablet Take 40 mg by mouth daily.     No current facility-administered medications for this visit.     Musculoskeletal: Strength & Muscle Tone:  UTA Gait & Station:  Seated Patient leans: N/A  Psychiatric Specialty Exam: Review of Systems  Psychiatric/Behavioral:  Positive for sleep disturbance.        Mood swings - improving  All other systems reviewed and are negative.  There were no vitals taken for this visit.There is no height or weight on file to calculate BMI.  General Appearance: Casual  Eye Contact:  Fair  Speech:  Clear and Coherent  Volume:  Normal  Mood:   mood swings - improving  Affect:  Congruent  Thought Process:  Goal Directed and Descriptions of Associations: Intact  Orientation:  Full (Time, Place, and Person)  Thought Content: Logical   Suicidal Thoughts:  No  Homicidal Thoughts:  No  Memory:  Immediate;   Fair Recent;   Fair Remote;   Fair  Judgement:  Fair  Insight:  Fair  Psychomotor Activity:  Normal  Concentration:  Concentration: Fair and Attention Span: Fair  Recall:  [name] of Knowledge: Fair  Language: Fair  Akathisia:  No  Handed:  Right  AIMS (if indicated): done  Assets:  Communication Skills Desire for Improvement Housing Social Support  ADL's:  Intact  Cognition: WNL  Sleep:  Poor   Screenings: AIMS    Flowsheet Row Video Visit from 03/16/2022 in [facility] Video Visit from 01/17/2022 in [facility]  AIMS Total Score 0 0      GAD-7    Flowsheet Row Video Visit from 12/15/2021 in [facility] Office Visit from 09/23/2021 in [facility]  Total GAD-7  Score 6 8      PHQ2-9    Flowsheet Row Video Visit from 03/16/2022 in [facility] Video Visit from 02/16/2022 in [facility] Video Visit from 01/17/2022 in [facility] Video Visit from 12/15/2021 in [facility] Office Visit from 11/17/2021 in [facility]  PHQ-2 Total Score 0 0  PHQ-9 Total Score -- 7      Flowsheet Row Video Visit from 03/16/2022 in [facility] Video Visit from 01/17/2022 in [facility] Video Visit from 12/15/2021 in [facility]  C-SSRS RISK CATEGORY Low Risk Low Risk No Risk        Assessment and Plan: [name] is a 61 year old Caucasian female, married, on disability, lives at [facility], has a history of bipolar disorder type II, GAD, multiple medical problems, obstructive sleep apnea on CPAP was evaluated by telemedicine today.  Patient is currently improving although she continues to have sleep problems, will benefit from the following plan.  Plan Bipolar disorder type II depressed in partial remission Continue Seroquel extended release 300 mg p.o. nightly Lamotrigine 200 mg p.o. twice daily Mirtazapine 7.5 mg p.o. nightly Advised patient to start taking hydroxyzine 25-50 mg p.o. nightly as needed for sleep  GAD-improving Seroquel 300 mg p.o. nightly Mirtazapine 7.5 mg p.o. nightly Continue CBT with Dr. Williemae Natter at [facility]  Insomnia-unstable Mirtazapine 7.5 mg p.o. nightly Seroquel as noted above. CPAP for OSA.   Patient could also use hydroxyzine 25-50 mg p.o. nightly as needed for sleep. Continue sleep hygiene techniques.  Follow-up in clinic in 6 to 8 weeks or sooner if needed.   Collaboration of Care: Collaboration of Care: Referral or follow-up with counselor/therapist AEB encouraged  to continue to follow-up with her therapist.  Patient/Guardian was advised Release of Information must be obtained prior to any record release in order to collaborate their care with an outside provider. Patient/Guardian was advised if they have not already done so to contact the registration department to sign all necessary forms in order for [place] to release information regarding their care.   Consent: Patient/Guardian gives verbal consent for treatment and assignment of benefits for services provided during this visit. Patient/Guardian expressed understanding and agreed to proceed.   This note was generated in part or whole with voice recognition software.  Voice recognition is usually quite accurate but there are transcription errors that can and very often do occur. I apologize for any typographical errors that were not detected and corrected.      Jomarie LongsSaramma Chanele Douglas, MD 03/16/2022, 4:18 PM

## 2022-04-04 DIAGNOSIS — F411 Generalized anxiety disorder: Secondary | ICD-10-CM | POA: Diagnosis not present

## 2022-04-04 DIAGNOSIS — F3181 Bipolar II disorder: Secondary | ICD-10-CM | POA: Diagnosis not present

## 2022-05-03 DIAGNOSIS — H524 Presbyopia: Secondary | ICD-10-CM | POA: Diagnosis not present

## 2022-05-10 DIAGNOSIS — F411 Generalized anxiety disorder: Secondary | ICD-10-CM | POA: Diagnosis not present

## 2022-05-10 DIAGNOSIS — F3181 Bipolar II disorder: Secondary | ICD-10-CM | POA: Diagnosis not present

## 2022-05-11 ENCOUNTER — Ambulatory Visit (INDEPENDENT_AMBULATORY_CARE_PROVIDER_SITE_OTHER): Payer: Medicare Other | Admitting: Psychiatry

## 2022-05-11 ENCOUNTER — Encounter: Payer: Self-pay | Admitting: Psychiatry

## 2022-05-11 VITALS — BP 144/89 | HR 80 | Temp 98.5°F | Wt 151.4 lb

## 2022-05-11 DIAGNOSIS — F5101 Primary insomnia: Secondary | ICD-10-CM

## 2022-05-11 DIAGNOSIS — F3181 Bipolar II disorder: Secondary | ICD-10-CM

## 2022-05-11 DIAGNOSIS — F411 Generalized anxiety disorder: Secondary | ICD-10-CM | POA: Diagnosis not present

## 2022-05-11 MED ORDER — BUPROPION HCL 75 MG PO TABS: 75.0000 mg | ORAL_TABLET | Freq: Every morning | ORAL | 1 refills | Status: DC

## 2022-05-11 NOTE — Patient Instructions (Signed)
Bupropion Tablets (Depression/Mood Disorders) ?What is this medication? ?BUPROPION (byoo PROE pee on) treats depression. It increases norepinephrine and dopamine in the brain, hormones that help regulate mood. It belongs to a group of medications called NDRIs. ?This medicine may be used for other purposes; ask your health care provider or pharmacist if you have questions. ?COMMON BRAND NAME(S): Wellbutrin ?What should I tell my care team before I take this medication? ?They need to know if you have any of these conditions: ?An eating disorder, such as anorexia or bulimia ?Bipolar disorder or psychosis ?Diabetes or high blood sugar, treated with medication ?Glaucoma ?Heart disease, previous heart attack, or irregular heart beat ?Head injury or brain tumor ?High blood pressure ?Kidney or liver disease ?Seizures ?Suicidal thoughts or a previous suicide attempt ?Tourette's syndrome ?Weight loss ?An unusual or allergic reaction to bupropion, other medications, foods, dyes, or preservatives ?Pregnant or trying to become pregnant ?Breast-feeding ?How should I use this medication? ?Take this medication by mouth with a glass of water. Follow the directions on the prescription label. You can take it with or without food. If it upsets your stomach, take it with food. Take your medication at regular intervals. Do not take your medication more often than directed. Do not stop taking this medication suddenly except upon the advice of your care team. Stopping this medication too quickly may cause serious side effects or your condition may worsen. ?A special MedGuide will be given to you by the pharmacist with each prescription and refill. Be sure to read this information carefully each time. ?Talk to your care team regarding the use of this medication in children. Special care may be needed. ?Overdosage: If you think you have taken too much of this medicine contact a poison control center or emergency room at once. ?NOTE: This  medicine is only for you. Do not share this medicine with others. ?What if I miss a dose? ?If you miss a dose, take it as soon as you can. If it is less than four hours to your next dose, take only that dose and skip the missed dose. Do not take double or extra doses. ?What may interact with this medication? ?Do not take this medication with any of the following: ?Linezolid ?MAOIs like Azilect, Carbex, Eldepryl, Marplan, Nardil, and Parnate ?Methylene blue (injected into a vein) ?Other medications that contain bupropion like Zyban ?This medication may also interact with the following: ?Alcohol ?Certain medications for anxiety or sleep ?Certain medications for blood pressure like metoprolol, propranolol ?Certain medications for depression or psychotic disturbances ?Certain medications for HIV or AIDS like efavirenz, lopinavir, nelfinavir, ritonavir ?Certain medications for irregular heart beat like propafenone, flecainide ?Certain medications for Parkinson's disease like amantadine, levodopa ?Certain medications for seizures like carbamazepine, phenytoin, phenobarbital ?Cimetidine ?Clopidogrel ?Cyclophosphamide ?Digoxin ?Furazolidone ?Isoniazid ?Nicotine ?Orphenadrine ?Procarbazine ?Steroid medications like prednisone or cortisone ?Stimulant medications for attention disorders, weight loss, or to stay awake ?Tamoxifen ?Theophylline ?Thiotepa ?Ticlopidine ?Tramadol ?Warfarin ?This list may not describe all possible interactions. Give your health care provider a list of all the medicines, herbs, non-prescription drugs, or dietary supplements you use. Also tell them if you smoke, drink alcohol, or use illegal drugs. Some items may interact with your medicine. ?What should I watch for while using this medication? ?Tell your care team if your symptoms do not get better or if they get worse. Visit your care team for regular checks on your progress. Because it may take several weeks to see the full effects of this  medication,   it is important to continue your treatment as prescribed. ?Watch for new or worsening thoughts of suicide or depression. This includes sudden changes in mood, behavior, or thoughts. These changes can happen at any time but are more common in the beginning of treatment or after a change in dose. Call your care team right away if you experience these thoughts or worsening depression. ?Manic episodes may happen in patients with bipolar disorder who take this medication. Watch for changes in feelings or behaviors such as feeling anxious, nervous, agitated, panicky, irritable, hostile, aggressive, impulsive, severely restless, overly excited and hyperactive, or trouble sleeping. These symptoms can happen at anytime but are more common in the beginning of treatment or after a change in dose. Call your care team right away if you notice any of these symptoms. ?This medication may cause serious skin reactions. They can happen weeks to months after starting the medication. Contact your care team right away if you notice fevers or flu-like symptoms with a rash. The rash may be red or purple and then turn into blisters or peeling of the skin. Or, you might notice a red rash with swelling of the face, lips or lymph nodes in your neck or under your arms. ?Avoid drinks that contain alcohol while taking this medication. Drinking large amounts of alcohol, using sleeping or anxiety medications, or quickly stopping the use of these agents while taking this medication may increase your risk for a seizure. ?Do not drive or use heavy machinery until you know how this medication affects you. This medication can impair your ability to perform these tasks. ?Do not take this medication close to bedtime. It may prevent you from sleeping. ?Your mouth may get dry. Chewing sugarless gum or sucking hard candy, and drinking plenty of water may help. Contact your care team if the problem does not go away or is severe. ?What side  effects may I notice from receiving this medication? ?Side effects that you should report to your care team as soon as possible: ?Allergic reactions--skin rash, itching, hives, swelling of the face, lips, tongue, or throat ?Increase in blood pressure ?Mood and behavior changes--anxiety, nervousness, confusion, hallucinations, irritability, hostility, thoughts of suicide or self-harm, worsening mood, feelings of depression ?Redness, blistering, peeling, or loosening of the skin, including inside the mouth ?Seizures ?Sudden eye pain or change in vision such as blurry vision, seeing halos around lights, vision loss ?Side effects that usually do not require medical attention (report to your care team if they continue or are bothersome): ?Constipation ?Dizziness ?Dry mouth ?Loss of appetite ?Nausea ?Tremors or shaking ?Trouble sleeping ?This list may not describe all possible side effects. Call your doctor for medical advice about side effects. You may report side effects to FDA at 1-800-FDA-1088. ?Where should I keep my medication? ?Keep out of the reach of children and pets. ?Store at room temperature between 20 and 25 degrees C (68 and 77 degrees F), away from direct sunlight and moisture. Keep tightly closed. Throw away any unused medication after the expiration date. ?NOTE: This sheet is a summary. It may not cover all possible information. If you have questions about this medicine, talk to your doctor, pharmacist, or health care provider. ?? 2023 Elsevier/Gold Standard (2020-12-23 00:00:00) ? ?

## 2022-05-11 NOTE — Progress Notes (Signed)
BH MD OP Progress Note  05/11/2022 12:57 PM [name]  MRN:  [id number]  Chief Complaint:  Chief Complaint  Patient presents with   Follow-up: 61 year old Caucasian female with history of bipolar disorder type II, generalized anxiety disorder, insomnia, presented for medication management with worsening depressive symptoms.   HPI: [name] is a 61 year old Caucasian female on disability, married, currently lives at [facility], has a history of bipolar disorder type II, GAD, hyperlipidemia, osteoporosis, obstructive sleep apnea on CPAP, history of cataract surgery was evaluated in office today.  Patient today reports since the past few weeks she has been having worsening depression symptoms.  She reports she struggles with motivation, struggles with fatigue, low energy, concentration problems as well as low mood.  Patient reports she has been compliant on her medications.  Denies any side effects.  Denies any other changes in her medical history or addition of any other medications that could have contributed to her fatigue.  Does report anxiety symptoms however that is mostly because of her housing situation.  She reports she is building a new home and that move in date got delayed.  She however reports she is planning to move in beginning of August.  That does help with her anxiety symptoms and she has been feeling  better with regards to her anxiety due to the same  Patient does report she has back pain however that just started few days ago.  Reports her depressive symptoms began even before that.  She has been taking ibuprofen to help with her pain.  Patient reports she was able to get a new CPAP and was able to follow up with her providers recently.  Everything is going well with regards to that, she is 100% compliant.  Sleep continues to be good.  Denies any suicidality, homicidality or perceptual disturbances.  Patient reports she currently follows up with her  psychotherapist-Dr. Lang Snow with [facility].  Therapy sessions has been helpful.  Reports good support system from her spouse.  Reports they have been trying to do things together.  Reports her spouse takes her for rides since that helps her to feel better.  Patient denies any other concerns today.  Visit Diagnosis:    ICD-10-CM   1. Bipolar II disorder, most recent episode major depressive (HCC)  F31.81 buPROPion (WELLBUTRIN) 75 MG tablet   moderate    2. GAD (generalized anxiety disorder)  F41.1     3. Primary insomnia  F51.01       Past Psychiatric History: Reviewed past psychiatric history from progress note on 09/01/2021.  Past trials of lithium-side effects, Ativan-side effects, Depakote-gained weight, Trileptal, Latuda-akathisia, Celexa, Effexor-made her manic, trazodone, Tranxene, Zoloft, Prozac, BuSpar-not helpful, lamotrigine-restless, quetiapine, Abilify, TCA.  Past Medical History:  Past Medical History:  Diagnosis Date   Bipolar 1 disorder (HCC)    Hyperlipidemia    OSA (obstructive sleep apnea)    CPAP dependent   Osteoporosis    PPD positive     Past Surgical History:  Procedure Laterality Date   ABDOMINAL HYSTERECTOMY     CATARACT EXTRACTION, BILATERAL     due to seroquel   ROBOTIC ASSISTED LAPAROSCOPIC HYSTERECTOMY AND SALPINGECTOMY      Family Psychiatric History: Reviewed family psychiatric history from progress note on 09/01/2021.  Family History:  Family History  Problem Relation Age of Onset   Personality disorder Mother    Alcohol abuse Maternal Uncle    Bipolar disorder Cousin  Schizophrenia Other     Social History: Reviewed social history from progress note on 09/01/2021. Social History   Socioeconomic History   Marital status: Married    Spouse name: [name]   Number of children: 2   Years of education: Not on file   Highest education level: Bachelor's degree (e.g., BA, AB, BS)  Occupational History     Comment: unemployed  Tobacco Use   Smoking status: Never   Smokeless tobacco: Never  Vaping Use   Vaping Use: Never used  Substance and Sexual Activity   Alcohol use: No   Drug use: No   Sexual activity: Yes  Other Topics Concern   Not on file  Social History Narrative   Not on file   Social Determinants of Health   Financial Resource Strain: Not on file  Food Insecurity: Not on file  Transportation Needs: Not on file  Physical Activity: Not on file  Stress: Not on file  Social Connections: Not on file    Allergies:  Allergies  Allergen Reactions   Tyloxapol Rash   Prilosec [Omeprazole]     Interacts with psych meds   Tylox [Oxycodone-Acetaminophen] Rash    Metabolic Disorder Labs: No results found for: "HGBA1C", "MPG" Lab Results  Component Value Date   PROLACTIN 5.1 09/01/2021   No results found for: "CHOL", "TRIG", "HDL", "CHOLHDL", "VLDL", "LDLCALC" Lab Results  Component Value Date   TSH 0.955 09/01/2021    Therapeutic Level Labs: No results found for: "LITHIUM" No results found for: "VALPROATE" No results found for: "CBMZ"  Current Medications: Current Outpatient Medications  Medication Sig Dispense Refill   alendronate (FOSAMAX) 70 MG tablet TAKE 1 TABLET BY MOUTH ONCE WEEKLY 30 MINUTES BEFORE FIRST FOOD, MEDICINE, OR WATER OF THE DAY     buPROPion (WELLBUTRIN) 75 MG tablet Take 1 tablet (75 mg total) by mouth in the morning. 30 tablet 1   Calcium Carb-Cholecalciferol 600-10 MG-MCG TABS Take 1 tablet by mouth daily.     lamoTRIgine (LAMICTAL) 200 MG tablet Take 1 tablet (200 mg total) by mouth 2 (two) times daily. 180 tablet 1   mirtazapine (REMERON) 7.5 MG tablet Take 1 tablet (7.5 mg total) by mouth at bedtime. For sleep and mood 90 tablet 1   Multiple Vitamin (MULTIVITAMIN) tablet Take 1 tablet by mouth daily.     QUEtiapine (SEROQUEL XR) 300 MG 24 hr tablet Take 1 tablet (300 mg total) by mouth at bedtime. 90 tablet 0   simvastatin (ZOCOR) 40 MG  tablet Take 40 mg by mouth daily.     hydrOXYzine (ATARAX) 25 MG tablet Take 1-2 tablets (25-50 mg total) by mouth at bedtime as needed. For sleep, anxiety (Patient not taking: Reported on 05/11/2022) 30 tablet 1   No current facility-administered medications for this visit.     Musculoskeletal: Strength & Muscle Tone: within normal limits Gait & Station: normal Patient leans: N/A  Psychiatric Specialty Exam: Review of Systems  Constitutional:  Positive for fatigue.  Musculoskeletal:  Positive for back pain.  Psychiatric/Behavioral:  Positive for decreased concentration and dysphoric mood. The patient is nervous/anxious.   All other systems reviewed and are negative.   Blood pressure (!) 144/89, pulse 80, temperature 98.5 F (36.9 C), temperature source Temporal, weight 151 lb 6.4 oz (68.7 kg).Body mass index is 24.81 kg/m.  General Appearance: Casual  Eye Contact:  Fair  Speech:  Clear and Coherent  Volume:  Normal  Mood:  Anxious and Depressed  Affect:  Congruent  Thought Process:  Goal Directed and Descriptions of Associations: Intact  Orientation:  Full (Time, Place, and Person)  Thought Content: Logical   Suicidal Thoughts:  No  Homicidal Thoughts:  No  Memory:  Immediate;   Fair Recent;   Fair Remote;   Fair  Judgement:  Fair  Insight:  Fair  Psychomotor Activity:  Normal  Concentration:  Concentration: Fair and Attention Span: Fair  Recall:  [employer] of Knowledge: Fair  Language: Fair  Akathisia:  No  Handed:  Right  AIMS (if indicated): done  Assets:  Communication Skills Desire for Improvement Housing Social Support Transportation  ADL's:  Intact  Cognition: WNL  Sleep:  Fair   Screenings: AIMS    Flowsheet Row Office Visit from 05/11/2022 in [facility] Video Visit from 03/16/2022 in [facility] Video Visit from 01/17/2022 in [facility]  AIMS Total Score 0 0 0       GAD-7    Flowsheet Row Office Visit from 05/11/2022 in [facility] Video Visit from 12/15/2021 in [facility] Office Visit from 09/23/2021 in [facility]  Total GAD-7 Score 16 6 8       PHQ2-9    Flowsheet Row Office Visit from 05/11/2022 in [facility] Video Visit from 03/16/2022 in [facility] Video Visit from 02/16/2022 in [facility] Video Visit from 01/17/2022 in [facility] Video Visit from 12/15/2021 in [facility]  PHQ-2 Total Score 5 1 3 6  0  PHQ-9 Total Score 18 7 11 15  --      Flowsheet Row Office Visit from 05/11/2022 in [facility] Video Visit from 03/16/2022 in [facility] Video Visit from 01/17/2022 in [facility]  C-SSRS RISK CATEGORY No Risk Low Risk Low Risk        Assessment and Plan: KEIRSTEN MATUSKA is a 61 year old Caucasian female, married, on disability, lives at [facility], has a history of bipolar disorder type II, GAD, multiple medical problems, obstructive sleep apnea on CPAP was evaluated in office today.  Patient is currently struggling with depressive symptoms, will benefit from following plan.  Plan Bipolar disorder type II depressed-unstable Seroquel extended release 300 mg p.o. nightly Lamotrigine 200 mg p.o. twice daily Mirtazapine 7.5 mg p.o. nightly Start Wellbutrin 75 mg p.o. daily in the morning Provided medication education. Continue hydroxyzine 25-50 mg p.o. nightly as needed for sleep  GAD-improving Seroquel 300 mg p.o. nightly Mirtazapine as prescribed Continue CBT with Dr[facility].  Insomnia-improving CPAP for OSA.  She is currently compliant Mirtazapine 7.5 mg p.o. nightly Seroquel as noted  above Continue sleep hygiene techniques. Continue hydroxyzine as noted above as needed  Follow-up in clinic in 4 to 5 weeks or sooner if needed.   This note was generated in part or whole with voice recognition software. Voice recognition is usually quite accurate but there are transcription errors that can and very often do occur. I apologize for any typographical errors that were not detected and corrected.       62, MD 05/11/2022, 12:57 PM

## 2022-05-27 DIAGNOSIS — R1310 Dysphagia, unspecified: Secondary | ICD-10-CM | POA: Diagnosis not present

## 2022-06-01 DIAGNOSIS — K209 Esophagitis, unspecified without bleeding: Secondary | ICD-10-CM | POA: Diagnosis not present

## 2022-06-01 DIAGNOSIS — K222 Esophageal obstruction: Secondary | ICD-10-CM | POA: Diagnosis not present

## 2022-06-01 DIAGNOSIS — R1310 Dysphagia, unspecified: Secondary | ICD-10-CM | POA: Diagnosis not present

## 2022-06-13 ENCOUNTER — Other Ambulatory Visit: Payer: Self-pay | Admitting: Psychiatry

## 2022-06-13 DIAGNOSIS — F3175 Bipolar disorder, in partial remission, most recent episode depressed: Secondary | ICD-10-CM

## 2022-06-15 DIAGNOSIS — F411 Generalized anxiety disorder: Secondary | ICD-10-CM | POA: Diagnosis not present

## 2022-06-15 DIAGNOSIS — F3181 Bipolar II disorder: Secondary | ICD-10-CM | POA: Diagnosis not present

## 2022-06-22 ENCOUNTER — Encounter: Payer: Self-pay | Admitting: Psychiatry

## 2022-06-22 ENCOUNTER — Ambulatory Visit: Payer: Medicare Other | Admitting: Psychiatry

## 2022-06-22 VITALS — BP 154/86 | HR 76 | Temp 97.8°F | Wt 148.8 lb

## 2022-06-22 DIAGNOSIS — F3181 Bipolar II disorder: Secondary | ICD-10-CM | POA: Diagnosis not present

## 2022-06-22 DIAGNOSIS — F411 Generalized anxiety disorder: Secondary | ICD-10-CM | POA: Diagnosis not present

## 2022-06-22 DIAGNOSIS — R413 Other amnesia: Secondary | ICD-10-CM

## 2022-06-22 DIAGNOSIS — F5101 Primary insomnia: Secondary | ICD-10-CM | POA: Diagnosis not present

## 2022-06-22 MED ORDER — MIRTAZAPINE 7.5 MG PO TABS: 7.5000 mg | ORAL_TABLET | Freq: Every day | ORAL | 1 refills | Status: DC

## 2022-06-22 MED ORDER — LAMOTRIGINE 200 MG PO TABS: 200.0000 mg | ORAL_TABLET | Freq: Two times a day (BID) | ORAL | 1 refills | Status: DC

## 2022-06-22 MED ORDER — BUPROPION HCL 75 MG PO TABS: 75.0000 mg | ORAL_TABLET | Freq: Every morning | ORAL | 1 refills | Status: DC

## 2022-06-22 MED ORDER — HYDROXYZINE HCL 25 MG PO TABS: 25.0000 mg | ORAL_TABLET | Freq: Every evening | ORAL | 1 refills | Status: DC | PRN

## 2022-06-22 NOTE — Patient Instructions (Signed)

## 2022-06-22 NOTE — Progress Notes (Signed)
BH MD OP Progress Note  06/22/2022 12:47 PM [name]  MRN:  [id number]  Chief Complaint:  Chief Complaint  Patient presents with   Follow-up: 61 year old Caucasian female with history of bipolar disorder type II, anxiety disorder, insomnia, presented for medication management.   HPI: [name] is a 61 year old Caucasian female, on disability, married, currently lives at [place], recently relocated, has a history of bipolar disorder type II, GAD, hyperlipidemia, osteoporosis, obstructive sleep apnea on CPAP, history of cataract surgery was evaluated in office today.  Patient today reports she is currently struggling with anxiety, has been nervous, restless, has racing thoughts.  Patient reports she is frustrated with everything that she has to do and take care of with the recent relocation.  She and her spouse moved to a new home in [place].  She reports although she was looking forward to this, the move has been very stressful.  Continues to be in psychotherapy, although she is aware of relaxation techniques she has not been practicing them at home.  She also has hydroxyzine available, agreeable to try it during the day.  Patient reports sadness, lack of motivation, fatigue and low energy has improved.  She does not feel as depressed as she used to be before.  Wellbutrin seems to be helpful.  Denies side effects.  Patient reports sleep has improved.  Continues to have memory problems, she mostly has short-term memory loss.  Patient completed SLUMS in session today.  Patient denies any suicidality, homicidality or perceptual disturbances.  Patient denies any other concerns today.    Visit Diagnosis:    ICD-10-CM   1. Bipolar II disorder, moderate, depressed, with seasonal pattern, in partial remission (HCC)  F31.81     2. GAD (generalized anxiety disorder)  F41.1 mirtazapine (REMERON) 7.5 MG tablet    3. Primary insomnia  F51.01 lamoTRIgine (LAMICTAL) 200  MG tablet    hydrOXYzine (ATARAX) 25 MG tablet    4. Memory loss  R41.3 buPROPion (WELLBUTRIN) 75 MG tablet   moderate      Past Psychiatric History: Reviewed past psychiatric history from progress note on 09/01/2021.  Past trials of lithium-side effects, Ativan-side effects, Depakote-gained weight, Trileptal, Latuda-akathisia, Celexa, Effexor-made her manic, trazodone, Tranxene, Zoloft, Prozac, BuSpar-not helpful, lamotrigine-restless, quetiapine, Abilify, TCA.  Past Medical History:  Past Medical History:  Diagnosis Date   Bipolar 1 disorder (HCC)    Hyperlipidemia    OSA (obstructive sleep apnea)    CPAP dependent   Osteoporosis    PPD positive     Past Surgical History:  Procedure Laterality Date   ABDOMINAL HYSTERECTOMY     CATARACT EXTRACTION, BILATERAL     due to seroquel   ROBOTIC ASSISTED LAPAROSCOPIC HYSTERECTOMY AND SALPINGECTOMY      Family Psychiatric History: Reviewed family psychiatric history from progress note on 09/01/2021.  Family History:  Family History  Problem Relation Age of Onset   Personality disorder Mother    Alcohol abuse Maternal Uncle    Bipolar disorder Cousin    Schizophrenia Other     Social History: Reviewed social history from progress note on 09/01/2021. Social History   Socioeconomic History   Marital status: Married    Spouse name: eric   Number of children: 2   Years of education: Not on file   Highest education level: Bachelor's degree (e.g., BA, AB, BS)  Occupational History    Comment: unemployed  Tobacco Use   Smoking status: Never   Smokeless tobacco: Never  Vaping  Use   Vaping Use: Never used  Substance and Sexual Activity   Alcohol use: No   Drug use: No   Sexual activity: Yes  Other Topics Concern   Not on file  Social History Narrative   Not on file   Social Determinants of Health   Financial Resource Strain: Not on file  Food Insecurity: Not on file  Transportation Needs: Not on file  Physical  Activity: Not on file  Stress: Not on file  Social Connections: Not on file    Allergies:  Allergies  Allergen Reactions   Tyloxapol Rash   Prilosec [Omeprazole]     Interacts with psych meds   Tylox [Oxycodone-Acetaminophen] Rash    Metabolic Disorder Labs: No results found for: "HGBA1C", "MPG" Lab Results  Component Value Date   PROLACTIN 5.1 09/01/2021   No results found for: "CHOL", "TRIG", "HDL", "CHOLHDL", "VLDL", "LDLCALC" Lab Results  Component Value Date   TSH 0.955 09/01/2021    Therapeutic Level Labs: No results found for: "LITHIUM" No results found for: "VALPROATE" No results found for: "CBMZ"  Current Medications: Current Outpatient Medications  Medication Sig Dispense Refill   alendronate (FOSAMAX) 70 MG tablet TAKE 1 TABLET BY MOUTH ONCE WEEKLY 30 MINUTES BEFORE FIRST FOOD, MEDICINE, OR WATER OF THE DAY     Multiple Vitamin (MULTIVITAMIN) tablet Take 1 tablet by mouth daily.     QUEtiapine (SEROQUEL XR) 300 MG 24 hr tablet TAKE 1 TABLET(300 MG) BY MOUTH AT BEDTIME 90 tablet 0   simvastatin (ZOCOR) 40 MG tablet Take 40 mg by mouth daily.     buPROPion (WELLBUTRIN) 75 MG tablet Take 1 tablet (75 mg total) by mouth in the morning. 90 tablet 1   Calcium Carb-Cholecalciferol 600-10 MG-MCG TABS Take 1 tablet by mouth daily. (Patient not taking: Reported on 06/22/2022)     hydrOXYzine (ATARAX) 25 MG tablet Take 1-2 tablets (25-50 mg total) by mouth at bedtime as needed. For sleep, anxiety 60 tablet 1   lamoTRIgine (LAMICTAL) 200 MG tablet Take 1 tablet (200 mg total) by mouth 2 (two) times daily. 180 tablet 1   mirtazapine (REMERON) 7.5 MG tablet Take 1 tablet (7.5 mg total) by mouth at bedtime. For sleep and mood 90 tablet 1   No current facility-administered medications for this visit.     Musculoskeletal: Strength & Muscle Tone: within normal limits Gait & Station: normal Patient leans: N/A  Psychiatric Specialty Exam: Review of Systems   Psychiatric/Behavioral:  The patient is nervous/anxious.   All other systems reviewed and are negative.   Blood pressure (!) 154/86, pulse 76, temperature 97.8 F (36.6 C), temperature source Temporal, weight 148 lb 12.8 oz (67.5 kg).Body mass index is 24.39 kg/m.  General Appearance: Casual  Eye Contact:  Fair  Speech:  Normal Rate  Volume:  Normal  Mood:  Anxious  Affect:  Congruent  Thought Process:  Goal Directed and Descriptions of Associations: Intact  Orientation:  Full (Time, Place, and Person)  Thought Content: Logical   Suicidal Thoughts:  No  Homicidal Thoughts:  No  Memory:  Immediate;   Fair Recent;   Fair Remote;   Fair reports short term memory loss  Judgement:  Fair  Insight:  Fair  Psychomotor Activity:  Normal  Concentration:  Concentration: Fair and Attention Span: Fair  Recall:  [employer] of Knowledge: Fair  Language: Fair  Akathisia:  No  Handed:  Right  AIMS (if indicated): done  Assets:  Communication Skills Desire  for Improvement Financial Resources/Insurance Housing Social Support Talents/Skills Transportation Vocational/Educational  ADL's:  Intact  Cognition: WNL  Sleep:  Fair   Screenings: AIMS    Flowsheet Row Office Visit from 06/22/2022 in [facility] Office Visit from 05/11/2022 in [facility] Video Visit from 03/16/2022 in [facility] Video Visit from 01/17/2022 in [facility]  AIMS Total Score 0 0 0 0      GAD-7    Flowsheet Row Office Visit from 06/22/2022 in [facility] Office Visit from 05/11/2022 in [facility] Video Visit from 12/15/2021 in [facility] Office Visit from 09/23/2021 in [facility]  Total GAD-7 Score 14 16 6 8       PHQ2-9    Flowsheet Row Office Visit from 06/22/2022 in [facility] Office Visit from 05/11/2022 in [facility] Video Visit from 03/16/2022 in [facility] Video Visit from 02/16/2022 in [facility] Video Visit from 01/17/2022 in [facility]  PHQ-2 Total Score 2 5 1 3 6   PHQ-9 Total Score 9 18 7 11 15       Flowsheet Row Office Visit from 06/22/2022 in [facility] Office Visit from 05/11/2022 in [facility] Video Visit from 03/16/2022 in [facility]  C-SSRS RISK CATEGORY No Risk No Risk Low Risk        Assessment and Plan: CAMIELLE SIZER is a 61 year old Caucasian female, married, on disability, lives at [place], has a history of bipolar type II, GAD, multiple medical problems including obstructive sleep apnea on CPAP was evaluated in office today.  Patient with recent relocation to a new home, does report anxiety although depression is currently improving.  Plan as noted below.  Plan  Bipolar disorder type II depressed in partial remission Seroquel extended release 300 mg p.o. nightly Lamotrigine 200 mg p.o. twice daily Mirtazapine 7.5 mg p.o. nightly Wellbutrin 75 mg p.o. daily in the morning Hydroxyzine 25-50 mg p.o. nightly as needed for sleep.  GAD-unstable Patient advised to have more frequent psychotherapy sessions, currently follows up with Dr.[facility]ola -[facility] Patient to make use of hydroxyzine half tablet during the day as needed for anxiety. Seroquel 300 mg at bedtime Mirtazapine 7.5 mg p.o. nightly.  Insomnia-stable CPAP for OSA Mirtazapine 7.5 mg p.o. nightly She also has hydroxyzine available as noted above  Memory loss-unstable Patient advised to keep a log and bring it back to next session. SLUMS -completed in session 23 out of 30. Discussed referral to neurology however patient would  like to wait.  Could reevaluate her in few months once her anxiety and depression is more stable. TSH-dated 09/01/2021-within normal limits Vitamin [id number]/28/2020-we will consider repeating this.  Follow-up in clinic in 4 to 6 weeks or sooner if needed.  This note was generated in part or whole with voice recognition software. Voice recognition is usually quite accurate but there are transcription errors that can and very often do occur. I apologize for any typographical errors that were not detected and corrected.      [facility], MD 06/22/2022, 12:47 PM

## 2022-06-28 ENCOUNTER — Other Ambulatory Visit: Payer: Self-pay | Admitting: Psychiatry

## 2022-06-28 DIAGNOSIS — F411 Generalized anxiety disorder: Secondary | ICD-10-CM

## 2022-06-28 DIAGNOSIS — F5101 Primary insomnia: Secondary | ICD-10-CM

## 2022-07-13 DIAGNOSIS — F411 Generalized anxiety disorder: Secondary | ICD-10-CM | POA: Diagnosis not present

## 2022-07-13 DIAGNOSIS — F3181 Bipolar II disorder: Secondary | ICD-10-CM | POA: Diagnosis not present

## 2022-07-16 ENCOUNTER — Other Ambulatory Visit: Payer: Self-pay | Admitting: Psychiatry

## 2022-07-16 DIAGNOSIS — R413 Other amnesia: Secondary | ICD-10-CM

## 2022-07-23 DIAGNOSIS — G4733 Obstructive sleep apnea (adult) (pediatric): Secondary | ICD-10-CM | POA: Diagnosis not present

## 2022-07-25 ENCOUNTER — Encounter: Payer: Self-pay | Admitting: Psychiatry

## 2022-07-25 ENCOUNTER — Ambulatory Visit (INDEPENDENT_AMBULATORY_CARE_PROVIDER_SITE_OTHER): Payer: Medicare Other | Admitting: Psychiatry

## 2022-07-25 VITALS — BP 137/84 | HR 81 | Temp 98.1°F | Wt 149.8 lb

## 2022-07-25 DIAGNOSIS — F3181 Bipolar II disorder: Secondary | ICD-10-CM | POA: Diagnosis not present

## 2022-07-25 DIAGNOSIS — F411 Generalized anxiety disorder: Secondary | ICD-10-CM

## 2022-07-25 DIAGNOSIS — F5101 Primary insomnia: Secondary | ICD-10-CM | POA: Diagnosis not present

## 2022-07-25 NOTE — Progress Notes (Signed)
BH MD OP Progress Note  07/25/2022 10:38 AM [name]  MRN:  [id number]  Chief Complaint:  Chief Complaint  Patient presents with   Follow-up   Medication Refill   Anxiety   Depression   HPI: [name] is a 61 year old Caucasian female on disability, married, currently lives at [place], has a history of bipolar disorder type II, GAD, hyperlipidemia, osteoporosis, obstructive sleep apnea on CPAP, history of cataract surgery was evaluated in office today.  Patient today reports she is currently doing well with regards to her mood.  Does not feel as anxious as she used to before.  Currently enjoying her new home and her neighborhood.  She reports she is able to sit out on her porch and enjoy the quietness around her.  She also takes time to go out for walks with her dog.  Patient reports her family members have observed the difference in her with regards to her anxiety.  Reports sleep as good.  Denies any significant mood lability, depression symptoms.  Reports she is compliant with her medications, denies side effects.  Continues to have short-term memory problems however she wants to give it more time before taking next steps or referral to neurology.  Denies any other concerns today.  Visit Diagnosis:    ICD-10-CM   1. Bipolar II disorder in full remission (HCC)  F31.81    most recent depressed    2. GAD (generalized anxiety disorder)  F41.1     3. Primary insomnia  F51.01       Past Psychiatric History: Reviewed past psychiatric history from progress note on 09/01/2021.  Past trials of lithium-side effects, Ativan-side effects, Depakote-gained weight, Trileptal, Latuda-akathisia, Celexa, Effexor-made her manic, trazodone, Tranxene, Zoloft, Prozac, BuSpar-not helpful, lamotrigine-restless, quetiapine, Abilify, TCA.  Past Medical History:  Past Medical History:  Diagnosis Date   Bipolar 1 disorder (HCC)    Hyperlipidemia    OSA (obstructive sleep  apnea)    CPAP dependent   Osteoporosis    PPD positive     Past Surgical History:  Procedure Laterality Date   ABDOMINAL HYSTERECTOMY     CATARACT EXTRACTION, BILATERAL     due to seroquel   ROBOTIC ASSISTED LAPAROSCOPIC HYSTERECTOMY AND SALPINGECTOMY      Family Psychiatric History: Reviewed family psychiatric history from progress note on 09/01/2021.  Family History:  Family History  Problem Relation Age of Onset   Personality disorder Mother    Alcohol abuse Maternal Uncle    Bipolar disorder Cousin    Schizophrenia Other     Social History: Reviewed social history from progress note on 09/01/2021. Social History   Socioeconomic History   Marital status: Married    Spouse name: eric   Number of children: 2   Years of education: Not on file   Highest education level: Bachelor's degree (e.g., BA, AB, BS)  Occupational History    Comment: unemployed  Tobacco Use   Smoking status: Never   Smokeless tobacco: Never  Vaping Use   Vaping Use: Never used  Substance and Sexual Activity   Alcohol use: No   Drug use: No   Sexual activity: Yes  Other Topics Concern   Not on file  Social History Narrative   Not on file   Social Determinants of Health   Financial Resource Strain: Not on file  Food Insecurity: Not on file  Transportation Needs: Not on file  Physical Activity: Not on file  Stress: Not on file  Social Connections:  Not on file    Allergies:  Allergies  Allergen Reactions   Tyloxapol Rash   Prilosec [Omeprazole]     Interacts with psych meds   Tylox [Oxycodone-Acetaminophen] Rash    Metabolic Disorder Labs: No results found for: "HGBA1C", "MPG" Lab Results  Component Value Date   PROLACTIN 5.1 09/01/2021   No results found for: "CHOL", "TRIG", "HDL", "CHOLHDL", "VLDL", "LDLCALC" Lab Results  Component Value Date   TSH 0.955 09/01/2021    Therapeutic Level Labs: No results found for: "LITHIUM" No results found for: "VALPROATE" No  results found for: "CBMZ"  Current Medications: Current Outpatient Medications  Medication Sig Dispense Refill   alendronate (FOSAMAX) 70 MG tablet TAKE 1 TABLET BY MOUTH ONCE WEEKLY 30 MINUTES BEFORE FIRST FOOD, MEDICINE, OR WATER OF THE DAY     buPROPion (WELLBUTRIN) 75 MG tablet Take 1 tablet (75 mg total) by mouth in the morning. 90 tablet 1   hydrOXYzine (ATARAX) 25 MG tablet Take 1-2 tablets (25-50 mg total) by mouth at bedtime as needed. For sleep, anxiety 60 tablet 1   lamoTRIgine (LAMICTAL) 200 MG tablet Take 1 tablet (200 mg total) by mouth 2 (two) times daily. 180 tablet 1   mirtazapine (REMERON) 7.5 MG tablet Take 1 tablet (7.5 mg total) by mouth at bedtime. For sleep and mood 90 tablet 1   Multiple Vitamin (MULTIVITAMIN) tablet Take 1 tablet by mouth daily.     QUEtiapine (SEROQUEL XR) 300 MG 24 hr tablet TAKE 1 TABLET(300 MG) BY MOUTH AT BEDTIME 90 tablet 0   simvastatin (ZOCOR) 40 MG tablet Take 40 mg by mouth daily.     Calcium Carb-Cholecalciferol 600-10 MG-MCG TABS Take 1 tablet by mouth daily. (Patient not taking: Reported on 07/25/2022)     No current facility-administered medications for this visit.     Musculoskeletal: Strength & Muscle Tone: within normal limits Gait & Station: normal Patient leans: N/A  Psychiatric Specialty Exam: Review of Systems  Psychiatric/Behavioral:  The patient is nervous/anxious.   All other systems reviewed and are negative.   Blood pressure 137/84, pulse 81, temperature 98.1 F (36.7 C), temperature source Temporal, weight 149 lb 12.8 oz (67.9 kg).Body mass index is 24.55 kg/m.  General Appearance: Casual  Eye Contact:  Fair  Speech:  Normal Rate  Volume:  Normal  Mood:  Anxious improving  Affect:  Appropriate  Thought Process:  Goal Directed and Descriptions of Associations: Intact  Orientation:  Full (Time, Place, and Person)  Thought Content: Logical   Suicidal Thoughts:  No  Homicidal Thoughts:  No  Memory:  Immediate;    Fair Recent;   Fair Remote;   Fair  Judgement:  Fair  Insight:  Fair  Psychomotor Activity:  Normal  Concentration:  Concentration: Fair and Attention Span: Fair  Recall:  [employer] of Knowledge: Fair  Language: Fair  Akathisia:  No  Handed:  Right  AIMS (if indicated): done  Assets:  Communication Skills Desire for Improvement Housing Social Support  ADL's:  Intact  Cognition: WNL  Sleep:  Fair   Screenings: [place] Office Visit from 07/25/2022 in [place] Office Visit from 06/22/2022 in [place] Office Visit from 05/11/2022 in [place] Video Visit from 03/16/2022 in [place] Video Visit from 01/17/2022 in [place] Total Score 0 0 0 0 0      GAD-7    Flowsheet Row  Office Visit from 07/25/2022 in [facility] Office Visit from 06/22/2022 in [facility] Office Visit from 05/11/2022 in [facility] Video Visit from 12/15/2021 in [facility] Office Visit from 09/23/2021 in [facility]  Total GAD-7 Score 8 14 16 6 8       PHQ2-9    Flowsheet Row Office Visit from 07/25/2022 in [facility] Office Visit from 06/22/2022 in [facility] Office Visit from 05/11/2022 in [facility] Video Visit from 03/16/2022 in [facility] Video Visit from 02/16/2022 in [facility]  PHQ-2 Total Score 2 2 5 1 3   PHQ-9 Total Score 9 9 18 7 11       Flowsheet Row Office Visit from 07/25/2022 in [facility] Office Visit from 06/22/2022 in [facility] Office Visit from 05/11/2022 in [facility]  C-SSRS RISK CATEGORY No Risk No Risk No Risk        Assessment and Plan: [name] is a 61 year old Caucasian female, married, on disability, lives at [place], has a history of bipolar disorder type II, GAD, multiple medical problems including obstructive sleep apnea on CPAP was evaluated in office today.  Patient is currently improving, will benefit from the following plan.  Plan Bipolar disorder type II depressed in full remission Seroquel extended release 300 mg p.o. nightly. Lamotrigine 200 mg p.o. twice daily Mirtazapine 7.5 mg p.o. nightly Wellbutrin 75 mg p.o. daily Hydroxyzine 25-50 mg p.o. nightly as needed for sleep  GAD-improving Continue psychotherapy with Dr.Nicola -[facility]. Seroquel 300 mg at bedtime Mirtazapine 7.5 mg at bedtime Hydroxyzine 25 mg half tablet during the day as needed for anxiety.  Insomnia-stable CPAP for OSA Mirtazapine 7.5 mg p.o. nightly Also has hydroxyzine available as noted above  Memory loss-unstable [place]-06/22/2022-23 out of 30. Patient advised to follow up with primary care provider to repeat vitamin B12 levels. Will reevaluate in future sessions.  Patient wants to hold off at this time and would like to wait.  Follow-up in clinic in 3 months or sooner if needed.  This note was generated in part or whole with voice recognition software. Voice recognition is usually quite accurate but there are transcription errors that can and very often do occur. I apologize for any typographical errors that were not detected and corrected.     Hard[place], MD 07/26/2022, 8:23 AM

## 2022-08-14 ENCOUNTER — Ambulatory Visit (HOSPITAL_COMMUNITY)
Admission: EM | Admit: 2022-08-14 | Discharge: 2022-08-14 | Disposition: A | Discharge status: Home / Self Care | Payer: Medicare Other | Attending: Emergency Medicine | Admitting: Emergency Medicine

## 2022-08-14 ENCOUNTER — Other Ambulatory Visit: Payer: Self-pay

## 2022-08-14 ENCOUNTER — Encounter (HOSPITAL_COMMUNITY): Payer: Self-pay | Admitting: Emergency Medicine

## 2022-08-14 DIAGNOSIS — S39012A Strain of muscle, fascia and tendon of lower back, initial encounter: Secondary | ICD-10-CM

## 2022-08-14 MED ORDER — TIZANIDINE HCL 4 MG PO TABS: 4.0000 mg | ORAL_TABLET | Freq: Four times a day (QID) | ORAL | 0 refills | Status: DC | PRN

## 2022-08-14 MED ORDER — KETOROLAC TROMETHAMINE 30 MG/ML IJ SOLN
30.0000 mg | Freq: Once | INTRAMUSCULAR | Status: AC
  Administered 2022-08-14: 30 mg via INTRAMUSCULAR

## 2022-08-14 MED ORDER — KETOROLAC TROMETHAMINE 30 MG/ML IJ SOLN
INTRAMUSCULAR | Status: AC
  Filled 2022-08-14: qty 1

## 2022-08-14 NOTE — ED Triage Notes (Signed)
Patient is complaining of lower back pain.  Patient reports falling 2 times a couple of weeks ago.  Patient did not seek medical care.  Friday, patient was "trotting" or playing with dog and "tweeked" back.  Pain does not go down either leg.  Denies issues with bowel or bladder.    Has taken advil for pain

## 2022-08-14 NOTE — ED Provider Notes (Addendum)
[facility]    CSN: [id number] Arrival date & time: 08/14/22  1029      History   Chief Complaint Chief Complaint  Patient presents with   Back Pain    HPI [name] is a 61 y.o. female.  Patient complaining of right lower back pain that started 2 days ago.  Patient endorses onset of symptoms began while walking her dog when she felt like she had a "tweak" in her back.  Patient reports having 2 falls 2 weeks ago, mechanism of fall occurred due to tripping and object.  Patient reports she is normally able to walk on her own without a cane or walker. Patient reports lower back symptoms did not occur due to fall 2 weeks ago. Patient denies any loss of consciousness or hitting head.  Denies any previous back surgery.  Patient has taken Aleve with no relief of symptoms.   Patient denies any numbness or tingling in upper or lower extremities.  Patient denies any change to bowel or bladder patterns.    Back Pain Associated symptoms: no fever, no numbness and no weakness     Past Medical History:  Diagnosis Date   Bipolar 1 disorder (HCC)    Hyperlipidemia    OSA (obstructive sleep apnea)    CPAP dependent   Osteoporosis    PPD positive     Patient Active Problem List   Diagnosis Date Noted   Prediabetes 01/17/2022   Bipolar disorder, in full remission, most recent episode depressed (HCC) 11/17/2021   Elevated blood pressure reading 09/24/2021   Herpes simplex 09/01/2021   Helicobacter pylori (H. pylori) as the cause of diseases classified elsewhere 09/01/2021   Gastro-esophageal reflux disease without esophagitis 09/01/2021   Bipolar II disorder, moderate, depressed, with seasonal pattern, in partial remission (HCC) 09/01/2021   High risk medication use 09/01/2021   At risk for prolonged QT interval syndrome 09/01/2021   Insomnia 09/01/2021   Osteoporosis 12/14/2018   History of electroconvulsive therapy 01/29/2015   Hyperlipidemia 01/09/2015    History of Clostridium difficile 01/09/2015   GAD (generalized anxiety disorder) 12/25/2014   Bipolar 1 disorder (HCC) 12/25/2014   Sleep apnea with use of continuous positive airway pressure (CPAP) 10/04/2001    Past Surgical History:  Procedure Laterality Date   ABDOMINAL HYSTERECTOMY     CATARACT EXTRACTION, BILATERAL     due to seroquel   ROBOTIC ASSISTED LAPAROSCOPIC HYSTERECTOMY AND SALPINGECTOMY      OB History   No obstetric history on file.      Home Medications    Prior to Admission medications   Medication Sig Start Date End Date Taking? Authorizing Provider  tiZANidine (ZANAFLEX) 4 MG tablet Take 1 tablet (4 mg total) by mouth every 6 (six) hours as needed for muscle spasms. 08/14/22  Yes Debby Freiberg, NP  alendronate (FOSAMAX) 70 MG tablet TAKE 1 TABLET BY MOUTH ONCE WEEKLY 30 MINUTES BEFORE FIRST FOOD, MEDICINE, OR WATER OF THE DAY 08/10/20   [provider]  buPROPion (WELLBUTRIN) 75 MG tablet Take 1 tablet (75 mg total) by mouth in the morning. 06/22/22   Jomarie Longs, MD  hydrOXYzine (ATARAX) 25 MG tablet Take 1-2 tablets (25-50 mg total) by mouth at bedtime as needed. For sleep, anxiety 06/22/22   Jomarie Longs, MD  lamoTRIgine (LAMICTAL) 200 MG tablet Take 1 tablet (200 mg total) by mouth 2 (two) times daily. 06/22/22   Jomarie Longs, MD  mirtazapine (REMERON) 7.5 MG tablet Take 1  tablet (7.5 mg total) by mouth at bedtime. For sleep and mood 06/22/22   Ursula Alert, MD  Multiple Vitamin (MULTIVITAMIN) tablet Take 1 tablet by mouth daily.    [provider]  QUEtiapine (SEROQUEL XR) 300 MG 24 hr tablet TAKE 1 TABLET(300 MG) BY MOUTH AT BEDTIME 06/13/22   Ursula Alert, MD  simvastatin (ZOCOR) 40 MG tablet Take 40 mg by mouth daily.    [provider]    Family History Family History  Problem Relation Age of Onset   Personality disorder Mother    Alcohol abuse Maternal Uncle    Bipolar disorder Cousin    Schizophrenia  Other     Social History Social History   Tobacco Use   Smoking status: Never   Smokeless tobacco: Never  Vaping Use   Vaping Use: Never used  Substance Use Topics   Alcohol use: No   Drug use: No     Allergies   Tyloxapol, Prilosec [omeprazole], and Tylox [oxycodone-acetaminophen]   Review of Systems Review of Systems  Constitutional:  Negative for chills and fever.  Musculoskeletal:  Positive for back pain (RT lower back) and myalgias (RT lower back). Negative for gait problem.  Skin:        Denies any changes to skin on back   Neurological:  Negative for weakness and numbness.     Physical Exam Triage Vital Signs ED Triage Vitals  Enc Vitals Group     BP 08/14/22 1205 (!) 148/90     Pulse Rate 08/14/22 1205 75     Resp 08/14/22 1205 18     Temp 08/14/22 1205 98 F (36.7 C)     Temp Source 08/14/22 1205 Oral     SpO2 08/14/22 1205 99 %     Weight --      Height --      Head Circumference --      Peak Flow --      Pain Score 08/14/22 1202 8     Pain Loc --      Pain Edu? --      Excl. in [place]? --    No data found.  Updated Vital Signs BP (!) 148/90 (BP Location: Right Arm)   Pulse 75   Temp 98 F (36.7 C) (Oral)   Resp 18   SpO2 99%      Physical Exam Vitals and nursing note reviewed.  Constitutional:      Appearance: Normal appearance.  Musculoskeletal:     Cervical back: Normal.     Thoracic back: Normal.     Lumbar back: Spasms and tenderness present. No swelling, edema, deformity, signs of trauma or bony tenderness. Decreased range of motion. Positive right straight leg raise test. Negative left straight leg raise test. No scoliosis.  Neurological:     General: No focal deficit present.     Mental Status: She is alert.      UC Treatments / Results  Labs (all labs ordered are listed, but only abnormal results are displayed) Labs Reviewed - No data to display  EKG   Radiology No results found.  Procedures Procedures (including  critical care time)  Medications Ordered in UC Medications  ketorolac (TORADOL) 30 MG/ML injection 30 mg (has no administration in time range)    Initial Impression / Assessment and Plan / UC Course  I have reviewed the triage vital signs and the nursing notes.  Pertinent labs & imaging results that were available during my care of  the patient were reviewed by me and considered in my medical decision making (see chart for details).     Patient was treated for muscle strain of the right lower back.  Patient was given Toradol injection in office.  Patient was given Zanaflex prescription.  Patient was made aware of safety precautions with muscle relaxant.  Patient was told she can start taking Aleve again tomorrow but she should refrain from taking any today since we gave her the Toradol injection in office.  Patient was given information for [facility] incase symptoms worsen or do not improve.  Patient verbalized understanding of instructions.  Final Clinical Impressions(s) / UC Diagnoses   Final diagnoses:  Strain of lumbar region, initial encounter     Discharge Instructions      I have sent Zanaflex to the pharmacy, you can take this medication every 6 hours as needed for the muscle spasm.  Please do not operate a car or heavy machinery after taking this medication because it can make you sleepy.   You may alternate heat and ice to the site of pain.   I have attached information for [facility] in the case that symptoms do not improve or worsen.        ED Prescriptions     Medication Sig Dispense Auth. Provider   tiZANidine (ZANAFLEX) 4 MG tablet Take 1 tablet (4 mg total) by mouth every 6 (six) hours as needed for muscle spasms. 30 tablet Debby Freiberg, NP      PDMP not reviewed this encounter.   Debby Freiberg, NP 08/14/22 1334    Debby Freiberg, NP 08/14/22 1335

## 2022-08-14 NOTE — Discharge Instructions (Addendum)
I have sent Zanaflex to the pharmacy, you can take this medication every 6 hours as needed for the muscle spasm.  Please do not operate a car or heavy machinery after taking this medication because it can make you sleepy.   You may start taking oral anti-inflammatory like Aleve or ibuprofen tomorrow, these do not take any today since we already gave you the injection anti-inflammatory in office.   You may alternate heat and ice to the site of pain.   I have attached information for [facility] in the case that symptoms do not improve or worsen.

## 2022-08-15 DIAGNOSIS — F3181 Bipolar II disorder: Secondary | ICD-10-CM | POA: Diagnosis not present

## 2022-08-15 DIAGNOSIS — F411 Generalized anxiety disorder: Secondary | ICD-10-CM | POA: Diagnosis not present

## 2022-08-22 DIAGNOSIS — G4733 Obstructive sleep apnea (adult) (pediatric): Secondary | ICD-10-CM | POA: Diagnosis not present

## 2022-08-31 ENCOUNTER — Emergency Department (HOSPITAL_COMMUNITY)
Admission: EM | Admit: 2022-08-31 | Discharge: 2022-09-01 | Disposition: A | Discharge status: Home / Self Care | Payer: Medicare Other | Attending: Emergency Medicine | Admitting: Emergency Medicine

## 2022-08-31 ENCOUNTER — Other Ambulatory Visit: Payer: Self-pay

## 2022-08-31 ENCOUNTER — Emergency Department (HOSPITAL_COMMUNITY): Payer: Medicare Other

## 2022-08-31 ENCOUNTER — Encounter (HOSPITAL_COMMUNITY): Payer: Self-pay

## 2022-08-31 DIAGNOSIS — Z90710 Acquired absence of both cervix and uterus: Secondary | ICD-10-CM | POA: Diagnosis not present

## 2022-08-31 DIAGNOSIS — N201 Calculus of ureter: Secondary | ICD-10-CM | POA: Diagnosis not present

## 2022-08-31 DIAGNOSIS — M549 Dorsalgia, unspecified: Secondary | ICD-10-CM | POA: Diagnosis not present

## 2022-08-31 DIAGNOSIS — N132 Hydronephrosis with renal and ureteral calculous obstruction: Secondary | ICD-10-CM | POA: Insufficient documentation

## 2022-08-31 DIAGNOSIS — N210 Calculus in bladder: Secondary | ICD-10-CM | POA: Diagnosis not present

## 2022-08-31 DIAGNOSIS — R109 Unspecified abdominal pain: Secondary | ICD-10-CM | POA: Diagnosis present

## 2022-08-31 DIAGNOSIS — I700 Atherosclerosis of aorta: Secondary | ICD-10-CM | POA: Diagnosis not present

## 2022-08-31 DIAGNOSIS — R0902 Hypoxemia: Secondary | ICD-10-CM | POA: Diagnosis not present

## 2022-08-31 DIAGNOSIS — R0689 Other abnormalities of breathing: Secondary | ICD-10-CM | POA: Diagnosis not present

## 2022-08-31 DIAGNOSIS — T68XXXA Hypothermia, initial encounter: Secondary | ICD-10-CM | POA: Diagnosis not present

## 2022-08-31 LAB — COMPREHENSIVE METABOLIC PANEL
ALT: 18 U/L (ref 0–44)
AST: 20 U/L (ref 15–41)
Albumin: 4.7 g/dL (ref 3.5–5.0)
Alkaline Phosphatase: 78 U/L (ref 38–126)
Anion gap: 15 (ref 5–15)
BUN: 15 mg/dL (ref 8–23)
CO2: 22 mmol/L (ref 22–32)
Calcium: 9.9 mg/dL (ref 8.9–10.3)
Chloride: 103 mmol/L (ref 98–111)
Creatinine, Ser: 1.15 mg/dL — ABNORMAL HIGH (ref 0.44–1.00)
GFR, Estimated: 54 mL/min — ABNORMAL LOW (ref >60)
Glucose, Bld: 134 mg/dL — ABNORMAL HIGH (ref 70–99)
Potassium: 3.7 mmol/L (ref 3.5–5.1)
Sodium: 140 mmol/L (ref 135–145)
Total Bilirubin: 1.3 mg/dL — ABNORMAL HIGH (ref 0.3–1.2)
Total Protein: 7.5 g/dL (ref 6.5–8.1)

## 2022-08-31 LAB — CBC WITH DIFFERENTIAL/PLATELET
Abs Immature Granulocytes: 0.05 10*3/uL (ref 0.00–0.07)
Basophils Absolute: 0 10*3/uL (ref 0.0–0.1)
Basophils Relative: 0 %
Eosinophils Absolute: 0 10*3/uL (ref 0.0–0.5)
Eosinophils Relative: 0 %
HCT: 42.1 % (ref 36.0–46.0)
Hemoglobin: 13.9 g/dL (ref 12.0–15.0)
Immature Granulocytes: 1 %
Lymphocytes Relative: 15 %
Lymphs Abs: 1.4 10*3/uL (ref 0.7–4.0)
MCH: 31.3 pg (ref 26.0–34.0)
MCHC: 33 g/dL (ref 30.0–36.0)
MCV: 94.8 fL (ref 80.0–100.0)
Monocytes Absolute: 0.6 10*3/uL (ref 0.1–1.0)
Monocytes Relative: 6 %
Neutro Abs: 7.5 10*3/uL (ref 1.7–7.7)
Neutrophils Relative %: 78 %
Platelets: 353 10*3/uL (ref 150–400)
RBC: 4.44 MIL/uL (ref 3.87–5.11)
RDW: 12.6 % (ref 11.5–15.5)
WBC: 9.7 10*3/uL (ref 4.0–10.5)
nRBC: 0 % (ref 0.0–0.2)

## 2022-08-31 LAB — LIPASE, BLOOD: Lipase: 77 U/L — ABNORMAL HIGH (ref 11–51)

## 2022-08-31 MED ORDER — HYDROCODONE-ACETAMINOPHEN 5-325 MG PO TABS
1.0000 | ORAL_TABLET | Freq: Once | ORAL | Status: AC
  Administered 2022-08-31: 1 via ORAL
  Filled 2022-08-31: qty 1

## 2022-08-31 MED ORDER — KETOROLAC TROMETHAMINE 30 MG/ML IJ SOLN
30.0000 mg | Freq: Once | INTRAMUSCULAR | Status: AC
  Administered 2022-08-31: 30 mg via INTRAVENOUS
  Filled 2022-08-31: qty 1

## 2022-08-31 NOTE — ED Notes (Signed)
Pt did not answer for vital update will try again  

## 2022-08-31 NOTE — ED Notes (Signed)
Told patient I understood she is hurting but yelling and screaming is not helping the situation.  Patient immediately quit screaming and yelling and stated " I can't help it".  Patient has been given of fentanyl hydrocodone heat packs and ice packs,.

## 2022-08-31 NOTE — ED Provider Triage Note (Signed)
Emergency Medicine Provider Triage Evaluation Note  [name] , a 61 y.o. female  was evaluated in triage.  Pt complains of right flank pain. Received 100 mcg of fentanyl with ems. Given a norco in triage and requesting more pain medication  Review of Systems  Positive: Right flank pain, nausea, vomiting Negative: Cp,sob  Physical Exam  BP (!) 171/83   Pulse 80   Temp 98.6 F (37 C) (Oral)   Resp 15   Ht 5\' 5"  (1.651 m)   Wt 67.6 kg   SpO2 100%   BMI 24.79 kg/m  Gen:   Awake, no distress   Resp:  Normal effort  MSK:   Moves extremities without difficulty  Other:    Medical Decision Making  Medically screening exam initiated at 3:32 PM.  Appropriate orders placed.  HAELI GERLICH was informed that the remainder of the evaluation will be completed by another provider, this initial triage assessment does not replace that evaluation, and the importance of remaining in the ED until their evaluation is complete.  3:33 PM Patient continues to appear uncomfortable screaming and crying while in triage.  Given 30 mg of Toradol via IV by nurse Merry Lofty, Melody Haver, PA-C 08/31/22 1533

## 2022-08-31 NOTE — ED Triage Notes (Signed)
Reports right flank and lower back pain with increased urgency to urinate but unable to urinate.  No suprapubic pain.  EMS gave fentanyl and 4mg  Zofran.  20LAC   HR 70 RR24  166/101 CBG 155 100RA

## 2022-08-31 NOTE — ED Notes (Signed)
Patient screaming and crying in triage.  Will attempt to give oral pain meds

## 2022-09-01 LAB — URINALYSIS, ROUTINE W REFLEX MICROSCOPIC
Bacteria, UA: NONE SEEN
Bilirubin Urine: NEGATIVE
Glucose, UA: NEGATIVE mg/dL
Ketones, ur: 5 mg/dL — AB
Nitrite: NEGATIVE
Protein, ur: NEGATIVE mg/dL
Specific Gravity, Urine: 1.021 (ref 1.005–1.030)
pH: 5 (ref 5.0–8.0)

## 2022-09-01 MED ORDER — HYDROCODONE-ACETAMINOPHEN 5-325 MG PO TABS
1.0000 | ORAL_TABLET | Freq: Once | ORAL | Status: AC
  Administered 2022-09-01: 1 via ORAL
  Filled 2022-09-01: qty 1

## 2022-09-01 MED ORDER — ONDANSETRON HCL 4 MG PO TABS: 4.0000 mg | ORAL_TABLET | Freq: Four times a day (QID) | ORAL | 0 refills | Status: DC

## 2022-09-01 MED ORDER — NORCO 5-325 MG PO TABS: 1.0000 | ORAL_TABLET | Freq: Four times a day (QID) | ORAL | 0 refills | Status: DC | PRN

## 2022-09-01 MED ORDER — SODIUM CHLORIDE 0.9 % IV BOLUS
1000.0000 mL | Freq: Once | INTRAVENOUS | Status: AC
  Administered 2022-09-01: 1000 mL via INTRAVENOUS

## 2022-09-01 MED ORDER — HYDROMORPHONE HCL 1 MG/ML IJ SOLN
0.5000 mg | Freq: Once | INTRAMUSCULAR | Status: AC
  Administered 2022-09-01: 0.5 mg via INTRAVENOUS
  Filled 2022-09-01: qty 1

## 2022-09-01 MED ORDER — KETOROLAC TROMETHAMINE 30 MG/ML IJ SOLN
15.0000 mg | Freq: Once | INTRAMUSCULAR | Status: AC
  Administered 2022-09-01: 15 mg via INTRAVENOUS
  Filled 2022-09-01: qty 1

## 2022-09-01 MED ORDER — TAMSULOSIN HCL 0.4 MG PO CAPS: 0.4000 mg | ORAL_CAPSULE | Freq: Every day | ORAL | 0 refills | Status: DC

## 2022-09-01 MED ORDER — ONDANSETRON HCL 4 MG/2ML IJ SOLN
4.0000 mg | Freq: Once | INTRAMUSCULAR | Status: AC
  Administered 2022-09-01: 4 mg via INTRAVENOUS
  Filled 2022-09-01: qty 2

## 2022-09-01 NOTE — ED Provider Notes (Signed)
[facility] EMERGENCY DEPARTMENT Provider Note   CSN: [id number] Arrival date & time: 08/31/22  1323     History  Chief Complaint  Patient presents with   Flank Pain    [name] is a 61 y.o. female.  [name] is a 61 y.o. female with a history of bipolar disorder, hyperlipidemia, sleep apnea, who presents to the emergency department for evaluation of right flank and low back pain.  Symptoms started suddenly yesterday morning.  Reports a sharp stabbing pain in the right low back.  She reports when this pain started she had the sudden urge to urinate but was unable to go.  She reports since then she has been able to urinate a few times and has not seen blood in her urine, denies burning with urination or urinary frequency.  She does report that with urination it tends to cause worsening right flank pain.  Patient reports associated nausea and vomiting, no hematemesis.  No fevers or chills.  She initially had asked her husband to drive her to the hospital but pain became too severe and so he took her back home and called EMS.  She was given fentanyl with EMS with brief initial improvement in pain.  From waiting room received Toradol and hydrocodone which gave temporary relief in pain but it has since returned.  She denies any history of similar pain, no prior history of kidney stones.  No other aggravating or alleviating factors.  The history is provided by the patient, the spouse and medical records.  Flank Pain Pertinent negatives include no chest pain, no abdominal pain and no shortness of breath.       Home Medications Prior to Admission medications   Medication Sig Start Date End Date Taking? Authorizing Provider  alendronate (FOSAMAX) 70 MG tablet TAKE 1 TABLET BY MOUTH ONCE WEEKLY 30 MINUTES BEFORE FIRST FOOD, MEDICINE, OR WATER OF THE DAY 08/10/20   [provider]  buPROPion (WELLBUTRIN) 75 MG tablet Take 1 tablet (75 mg total) by  mouth in the morning. 06/22/22   Jomarie Longs, MD  hydrOXYzine (ATARAX) 25 MG tablet Take 1-2 tablets (25-50 mg total) by mouth at bedtime as needed. For sleep, anxiety 06/22/22   Jomarie Longs, MD  lamoTRIgine (LAMICTAL) 200 MG tablet Take 1 tablet (200 mg total) by mouth 2 (two) times daily. 06/22/22   Jomarie Longs, MD  mirtazapine (REMERON) 7.5 MG tablet Take 1 tablet (7.5 mg total) by mouth at bedtime. For sleep and mood 06/22/22   Jomarie Longs, MD  Multiple Vitamin (MULTIVITAMIN) tablet Take 1 tablet by mouth daily.    [provider]  QUEtiapine (SEROQUEL XR) 300 MG 24 hr tablet TAKE 1 TABLET(300 MG) BY MOUTH AT BEDTIME 06/13/22   Jomarie Longs, MD  simvastatin (ZOCOR) 40 MG tablet Take 40 mg by mouth daily.    [provider]  tiZANidine (ZANAFLEX) 4 MG tablet Take 1 tablet (4 mg total) by mouth every 6 (six) hours as needed for muscle spasms. 08/14/22   Debby Freiberg, NP      Allergies    Tyloxapol, Prilosec [omeprazole], and Tylox [oxycodone-acetaminophen]    Review of Systems   Review of Systems  Constitutional:  Negative for chills and fever.  Respiratory:  Negative for cough and shortness of breath.   Cardiovascular:  Negative for chest pain.  Gastrointestinal:  Positive for nausea and vomiting. Negative for abdominal pain.  Genitourinary:  Positive for flank pain. Negative for dysuria,  frequency and hematuria.  Musculoskeletal:  Positive for back pain. Negative for arthralgias and myalgias.    Physical Exam Updated Vital Signs BP (!) 148/87 (BP Location: Right Arm)   Pulse 65   Temp (!) 97.5 F (36.4 C) (Oral)   Resp 16   Ht 5\' 5"  (1.651 m)   Wt 67.6 kg   SpO2 100%   BMI 24.79 kg/m  Physical Exam Vitals and nursing note reviewed.  Constitutional:      General: She is not in acute distress.    Appearance: Normal appearance. She is well-developed. She is not ill-appearing or diaphoretic.     Comments: Patient is alert, feels  uncomfortable but is in no acute distress  HENT:     Head: Normocephalic and atraumatic.     Mouth/Throat:     Mouth: Mucous membranes are moist.     Pharynx: Oropharynx is clear.  Eyes:     General:        Right eye: No discharge.        Left eye: No discharge.  Cardiovascular:     Rate and Rhythm: Normal rate and regular rhythm.     Pulses: Normal pulses.     Heart sounds: Normal heart sounds.  Pulmonary:     Effort: Pulmonary effort is normal. No respiratory distress.     Breath sounds: Normal breath sounds. No wheezing or rales.     Comments: Respirations equal and unlabored, patient able to speak in full sentences, lungs clear to auscultation bilaterally  Abdominal:     General: Bowel sounds are normal. There is no distension.     Palpations: Abdomen is soft. There is no mass.     Tenderness: There is no abdominal tenderness. There is right CVA tenderness. There is no guarding.     Comments: Abdomen soft, nondistended, nontender to palpation in all quadrants without guarding or peritoneal signs, there is some mild right CVA tenderness  Musculoskeletal:        General: No deformity.     Cervical back: Neck supple.  Skin:    General: Skin is warm and dry.     Capillary Refill: Capillary refill takes less than 2 seconds.  Neurological:     Mental Status: She is alert and oriented to person, place, and time.     Coordination: Coordination normal.     Comments: Speech is clear, able to follow commands Moves extremities without ataxia, coordination intact  Psychiatric:        Mood and Affect: Mood normal.        Behavior: Behavior normal.     ED Results / Procedures / Treatments   Labs (all labs ordered are listed, but only abnormal results are displayed) Labs Reviewed  COMPREHENSIVE METABOLIC PANEL - Abnormal; Notable for the following components:      Result Value   Glucose, Bld 134 (*)    Creatinine, Ser 1.15 (*)    Total Bilirubin 1.3 (*)    GFR, Estimated 54 (*)     All other components within normal limits  LIPASE, BLOOD - Abnormal; Notable for the following components:   Lipase 77 (*)    All other components within normal limits  CBC WITH DIFFERENTIAL/PLATELET  URINALYSIS, ROUTINE W REFLEX MICROSCOPIC    EKG None  Radiology CT Renal Stone Study  Result Date: 08/31/2022 CLINICAL DATA:  Right flank pain.  Evaluate for kidney stone. EXAM: CT ABDOMEN AND PELVIS WITHOUT CONTRAST TECHNIQUE: Multidetector CT imaging of the abdomen and  pelvis was performed following the standard protocol without IV contrast. RADIATION DOSE REDUCTION: This exam was performed according to the departmental dose-optimization program which includes automated exposure control, adjustment of the mA and/or kV according to patient size and/or use of iterative reconstruction technique. COMPARISON:  None Available. FINDINGS: Lower chest: Scarring identified within the inferior lingula. Hepatobiliary: No focal liver abnormality is seen. No gallstones, gallbladder wall thickening, or biliary dilatation. Pancreas: Unremarkable. No pancreatic ductal dilatation or surrounding inflammatory changes. Spleen: Normal in size without focal abnormality. Adrenals/Urinary Tract: Normal adrenal glands. The left kidney is normal. There is right-sided nephromegaly, hydronephrosis and perinephric fat stranding. Right hydroureter is identified. Punctate stone within the distal right ureter, just before the bladder, measures 2 mm, image 57/6. Urinary bladder appears normal. Stomach/Bowel: Stomach appears normal. The appendix is visualized and is within normal limits. No bowel wall thickening, inflammation, or distension. Vascular/Lymphatic: Aortic atherosclerotic calcifications. No abdominopelvic adenopathy. Reproductive: Status post hysterectomy. No adnexal masses. Other: No free fluid or fluid collections identified. Musculoskeletal: No acute or significant osseous findings. IMPRESSION: 1. Right-sided  hydronephrosis and hydroureter secondary to 2 mm distal right ureteral calculus. 2.  Aortic Atherosclerosis (ICD10-I70.0). Electronically Signed   By: Signa Kell M.D.   On: 08/31/2022 14:38    Procedures Procedures    Medications Ordered in ED Medications  HYDROcodone-acetaminophen (NORCO/VICODIN) 5-325 MG per tablet 1 tablet (1 tablet Oral Given 08/31/22 1413)  ketorolac (TORADOL) 30 MG/ML injection 30 mg (30 mg Intravenous Given 08/31/22 1541)  HYDROcodone-acetaminophen (NORCO/VICODIN) 5-325 MG per tablet 1 tablet (1 tablet Oral Given 09/01/22 1610)    ED Course/ Medical Decision Making/ A&P                           Medical Decision Making  Patient presents to the ED with complaints of right flank pain and right low back pain . Patient nontoxic appearing, vitals without significant abnormalities. On exam patient is mildly tender over right flank, no peritoneal signs. DDX: nephrolithiasis, pyelonephritis/UTI, cholecystitis, pancreatitis, bowel obstruction/perforation, appendicitis, dissection, feel nephrolithiasis is most likely at this time will evaluate with labs and CT renal study, analgesics, anti-emetics, and fluids ordered.   No leukocytosis, anemia, or significant electrolyte derangements.  CT renal study with 11mm stone in right distal ureter with associated hydronephrosis. Renal function very mildly elevated at 1.15, fluids given. Urinalysis without appearance of superimposed infection. Patient tolerating PO in the ER with pain well controlled. Will discharge home with Flomax, Zofran, NSAID, and Norco with urology follow up. [place] Controlled Substance reporting System queried. I discussed results, treatment plan, need for urology follow-up, and return precautions with the patient. Provided opportunity for questions, patient confirmed understanding and is in agreement with plan.          Final Clinical Impression(s) / ED Diagnoses Final diagnoses:  Ureterolithiasis     Rx / DC Orders ED Discharge Orders          Ordered    ondansetron (ZOFRAN) 4 MG tablet  Every 6 hours        09/01/22 1040    tamsulosin (FLOMAX) 0.4 MG CAPS capsule  Daily        09/01/22 1040    NORCO 5-325 MG tablet  Every 6 hours PRN        09/01/22 1040              Jodi Geralds [place], [place] 09/01/22 1115    Jacalyn Lefevre, MD  09/01/22 1130  

## 2022-09-01 NOTE — Discharge Instructions (Addendum)
You were seen in the emergency department and found to have a kidney stone.  We are sending you home with multiple medications to assist with passing the stone:   -Flomax-this is a medication to help pass the stone, it allows urine to exit the body more freely.  Please take this once daily with a meal.  -Ibuprofen 600 mg-this is a medication that will help with pain as well as passing the stone.  Please take this every 8 hours.  Take this with food as it can cause stomach upset and at worst stomach bleeding.  Do not take other NSAIDs such as Motrin, Aleve, Advil, Mobic, or Naproxen with this medicine as they are similar and would propagate any potential side effects.   -Norco-this is a narcotic/controlled substance medication that has potential addicting qualities.  We recommend that you take 1-2 tablets every 6 hours as needed for severe pain.  Do not drive or operate heavy machinery when taking this medicine as it can be sedating. Do not drink alcohol or take other sedating medications when taking this medicine for safety reasons.  Keep this out of reach of small children.  Please be aware this medicine has Tylenol in it (325 mg/tab) do not exceed the maximum dose of Tylenol in a day per over the counter recommendations should you decide to supplement with Tylenol over the counter.   -Zofran-this is an antinausea medication, you may take this every 8 hours as needed for nausea and vomiting, please allow the tablet to dissolve underneath of your tongue.   We have prescribed you new medication(s) today. Discuss the medications prescribed today with your pharmacist as they can have adverse effects and interactions with your other medicines including over the counter and prescribed medications. Seek medical evaluation if you start to experience new or abnormal symptoms after taking one of these medicines, seek care immediately if you start to experience difficulty breathing, feeling of your throat closing,  facial swelling, or rash as these could be indications of a more serious allergic reaction  Please follow-up with the urology group provided in your discharge instructions within 3 to 5 days.  Return to the ER for new or worsening symptoms including but not limited to worsening pain not controlled by these medicines, inability to keep fluids down, fever, or any other concerns that you may have.

## 2022-09-01 NOTE — ED Notes (Signed)
Patient tearful asking for pain medicine. Triage RN notified.

## 2022-09-01 NOTE — ED Notes (Signed)
PT verbalized understanding of discharge instructions, medications and no further questions at the time.

## 2022-09-13 DIAGNOSIS — F411 Generalized anxiety disorder: Secondary | ICD-10-CM | POA: Diagnosis not present

## 2022-09-13 DIAGNOSIS — F3181 Bipolar II disorder: Secondary | ICD-10-CM | POA: Diagnosis not present

## 2022-09-19 ENCOUNTER — Telehealth: Payer: Self-pay

## 2022-09-19 DIAGNOSIS — F5101 Primary insomnia: Secondary | ICD-10-CM

## 2022-09-19 DIAGNOSIS — F3175 Bipolar disorder, in partial remission, most recent episode depressed: Secondary | ICD-10-CM

## 2022-09-19 MED ORDER — QUETIAPINE FUMARATE ER 300 MG PO TB24: ORAL_TABLET | ORAL | 0 refills | Status: DC

## 2022-09-19 NOTE — Telephone Encounter (Signed)
pt left message that she needed refill on the hydroxyzine and the quetiapine.

## 2022-09-19 NOTE — Telephone Encounter (Signed)
Contacted patient to discuss refills, she is not in need of hydroxyzine.  Would like her Lamictal and Seroquel sent out.  Per review of medical records Lamictal she does have 1 refill available.  Patient advised to contact [facility] to transfer it to her new pharmacy in [place].  Will go ahead and send Seroquel to her pharmacy in [place].

## 2022-09-22 DIAGNOSIS — G4733 Obstructive sleep apnea (adult) (pediatric): Secondary | ICD-10-CM | POA: Diagnosis not present

## 2022-10-11 DIAGNOSIS — F3181 Bipolar II disorder: Secondary | ICD-10-CM | POA: Diagnosis not present

## 2022-10-11 DIAGNOSIS — F429 Obsessive-compulsive disorder, unspecified: Secondary | ICD-10-CM | POA: Diagnosis not present

## 2022-10-11 DIAGNOSIS — F411 Generalized anxiety disorder: Secondary | ICD-10-CM | POA: Diagnosis not present

## 2022-10-26 ENCOUNTER — Encounter: Payer: Self-pay | Admitting: Psychiatry

## 2022-10-26 ENCOUNTER — Ambulatory Visit: Payer: Medicare Other | Admitting: Psychiatry

## 2022-10-26 VITALS — BP 145/90 | HR 82 | Temp 98.1°F | Ht 65.0 in | Wt 148.0 lb

## 2022-10-26 DIAGNOSIS — F3181 Bipolar II disorder: Secondary | ICD-10-CM

## 2022-10-26 DIAGNOSIS — Z79899 Other long term (current) drug therapy: Secondary | ICD-10-CM | POA: Diagnosis not present

## 2022-10-26 DIAGNOSIS — F5101 Primary insomnia: Secondary | ICD-10-CM | POA: Diagnosis not present

## 2022-10-26 DIAGNOSIS — F411 Generalized anxiety disorder: Secondary | ICD-10-CM | POA: Diagnosis not present

## 2022-10-26 NOTE — Progress Notes (Unsigned)
[place] MD OP Progress Note  10/26/2022 10:32 AM CORYN [facility]  MRN:  [id number]  Chief Complaint:  Chief Complaint  Patient presents with   Follow-up   Medication Refill   Depression   Anxiety   HPI: [name] is a 62 year old okay patient female, on disability, married, currently lives in [place], has a history of bipolar disorder type II, GAD, hyperlipidemia, osteoporosis, obstructive sleep apnea on CPAP, history of cataract surgery was evaluated in office today.  Patient today reports overall she is currently doing fairly well on the current combination of medication.  She reports she may have had a couple of days here and there when she felt down, unmotivated and did not feel like doing anything.  She reports usually when it happens it lasts for a couple of days and it gets better.  The last time it happened may have been 2 weeks ago.  Patient reports sleep is overall okay.  It takes her time to fall asleep however once she is able to fall asleep she sleeps good.  She gets around 8 hours of sleep on an average.  She continues to be compliant on the CPAP.  Currently taking Seroquel and mirtazapine at night.  Would like to try coming off of the mirtazapine.  Agreeable to using it as needed and monitoring her sleep.  Patient denies any suicidality, homicidality or perceptual disturbances.  Patient with recent visit to the emergency department for renal stone, currently denies any concerns.  Patient with mildly elevated blood pressure reading in session, this was already evaluated by her primary care provider.  Patient is currently monitoring it and reports it stays high when she goes to [employer] [facility].  Continues to follow-up with her therapist.  Patient denies any other concerns today.  Visit Diagnosis:    ICD-10-CM   1. Bipolar II disorder in full remission ([place])  F31.81 Lipid panel    Prolactin    TSH    Hemoglobin A1C   most recent depressed    2. GAD  (generalized anxiety disorder)  F41.1     3. Primary insomnia  F51.01     4. High risk medication use  Z79.899 Lipid panel    Prolactin    TSH    Hemoglobin A1C      Past Psychiatric History: Reviewed past psychiatric history from progress note on 09/01/2021.  Past trials of lithium-side effects, Ativan-side effects, Depakote-gained weight, Trileptal, Latuda-acathisia, Celexa, Effexor-made her manic, trazodone,tranxene, Zoloft, Prozac, BuSpar-not helpful, lamotrigine-restless, quetiapine, Abilify, TCA.  Past Medical History:  Past Medical History:  Diagnosis Date   Bipolar 1 disorder ([place])    Hyperlipidemia    OSA (obstructive sleep apnea)    CPAP dependent   Osteoporosis    PPD positive     Past Surgical History:  Procedure Laterality Date   ABDOMINAL HYSTERECTOMY     CATARACT EXTRACTION, BILATERAL     due to seroquel   ROBOTIC ASSISTED LAPAROSCOPIC HYSTERECTOMY AND SALPINGECTOMY      Family Psychiatric History: Reviewed family psychiatric history from progress note on 09/01/2021.  Family History:  Family History  Problem Relation Age of Onset   Personality disorder Mother    Alcohol abuse Maternal Uncle    Bipolar disorder Cousin    Schizophrenia Other     Social History: Reviewed social history from progress note on 09/01/2021. Social History   Socioeconomic History   Marital status: Married    Spouse name: eric   Number of children: 2  Years of education: Not on file   Highest education level: Bachelor's degree (e.g., BA, AB, BS)  Occupational History    Comment: unemployed  Tobacco Use   Smoking status: Never   Smokeless tobacco: Never  Vaping Use   Vaping Use: Never used  Substance and Sexual Activity   Alcohol use: No   Drug use: No   Sexual activity: Yes  Other Topics Concern   Not on file  Social History Narrative   Not on file   Social Determinants of Health   Financial Resource Strain: Not on file  Food Insecurity: Not on file   Transportation Needs: Not on file  Physical Activity: Not on file  Stress: Not on file  Social Connections: Not on file    Allergies:  Allergies  Allergen Reactions   Tyloxapol Rash   Prilosec [Omeprazole]     Interacts with psych meds   Tylox [Oxycodone-Acetaminophen] Rash    Metabolic Disorder Labs: No results found for: "HGBA1C", "MPG" Lab Results  Component Value Date   PROLACTIN 5.1 09/01/2021   No results found for: "CHOL", "TRIG", "HDL", "CHOLHDL", "VLDL", "LDLCALC" Lab Results  Component Value Date   TSH 0.955 09/01/2021    Therapeutic Level Labs: No results found for: "LITHIUM" No results found for: "VALPROATE" No results found for: "CBMZ"  Current Medications: Current Outpatient Medications  Medication Sig Dispense Refill   alendronate (FOSAMAX) 70 MG tablet TAKE 1 TABLET BY MOUTH ONCE WEEKLY 30 MINUTES BEFORE FIRST FOOD, MEDICINE, OR WATER OF THE DAY     buPROPion (WELLBUTRIN) 75 MG tablet Take 1 tablet (75 mg total) by mouth in the morning. 90 tablet 1   hydrOXYzine (ATARAX) 25 MG tablet Take 1-2 tablets (25-50 mg total) by mouth at bedtime as needed. For sleep, anxiety 60 tablet 1   lamoTRIgine (LAMICTAL) 200 MG tablet Take 1 tablet (200 mg total) by mouth 2 (two) times daily. 180 tablet 1   mirtazapine (REMERON) 7.5 MG tablet Take 1 tablet (7.5 mg total) by mouth at bedtime. For sleep and mood 90 tablet 1   Multiple Vitamin (MULTIVITAMIN) tablet Take 1 tablet by mouth daily.     QUEtiapine (SEROQUEL XR) 300 MG 24 hr tablet TAKE 1 TABLET(300 MG) BY MOUTH AT BEDTIME 90 tablet 0   simvastatin (ZOCOR) 40 MG tablet Take 40 mg by mouth daily.     No current facility-administered medications for this visit.     Musculoskeletal: Strength & Muscle Tone: within normal limits Gait & Station: normal Patient leans: N/A  Psychiatric Specialty Exam: Review of Systems  Psychiatric/Behavioral: Negative.    All other systems reviewed and are negative.   Blood  pressure (!) 145/90, pulse 82, temperature 98.1 F (36.7 C), temperature source Oral, height 5\' 5"  (1.651 m), weight 148 lb (67.1 kg), SpO2 97 %.Body mass index is 24.63 kg/m.  General Appearance: Casual  Eye Contact:  Fair  Speech:  Clear and Coherent  Volume:  Normal  Mood:  Euthymic  Affect:  Congruent  Thought Process:  Goal Directed and Descriptions of Associations: Intact  Orientation:  Full (Time, Place, and Person)  Thought Content: Logical   Suicidal Thoughts:  No  Homicidal Thoughts:  No  Memory:  Immediate;   Fair Recent;   Fair Remote;   Fair  Judgement:  Fair  Insight:  Fair  Psychomotor Activity:  Normal  Concentration:  Concentration: Fair and Attention Span: Fair  Recall:  of Knowledge: Fair  Language: Fair  Akathisia:  No  Handed:  Right  AIMS (if indicated): done  Assets:  Communication Skills Desire for Improvement Housing Intimacy Social Support  ADL's:  Intact  Cognition: WNL  Sleep:  Fair   Screenings: [place] Office Visit from 07/25/2022 in [place] Office Visit from 06/22/2022 in [place] Office Visit from 05/11/2022 in [place] Video Visit from 03/16/2022 in [place] Video Visit from 01/17/2022 in [place] Total Score 0 0 0 0 [place] Visit from 10/26/2022 in [place] Office Visit from 07/25/2022 in [place] Visit from 06/22/2022 in [place] Visit from 05/11/2022 in [place] Video Visit from 12/15/2021 in [place]  Total GAD-7 Score 9 8 14 16 6       PHQ2-9    [place] Office Visit from 10/26/2022 in [place] Office Visit from  07/25/2022 in [place] Visit from 06/22/2022 in [place] Visit from 05/11/2022 in [place] Video Visit from 03/16/2022 in [place]  PHQ-2 Total Score 2 2 2 5 1   PHQ-9 Total Score 10 9 9 18 7       [place] Office Visit from 10/26/2022 in [facility] ED from 08/31/2022 in [place] ED from 08/14/2022 in [facility] [place] No Risk No Risk No Risk        Assessment and Plan: [name] is a 62 year old Caucasian female, married, on disability, lives in [place], has a history of bipolar disorder type II, GAD, multiple medical problems including obstructive sleep apnea on CPAP was evaluated in office today.  Patient is currently stable.  Plan as noted below.  Plan Bipolar disorder type II depressed in full remission Seroquel extended release 300 mg p.o. nightly Lamotrigine 200 mg p.o. twice daily Mirtazapine 7.5 mg p.o. nightly, change to as needed. Patient could stop taking it if she is able to sleep without it. Wellbutrin SR 75 mg p.o. daily Hydroxyzine 25-50 mg p.o. nightly as needed for sleep   GAD-stable Continue CBT with Dr.Nicola-W[facility] Seroquel 300 mg at bedtime Mirtazapine as prescribed. Hydroxyzine 25 mg half tablet during the day as needed available, however patient has not been using it.  Insomnia-stable CPAP for OSA. Change mirtazapine to 7.5 mg p.o. nightly as needed Hydroxyzine is also available as noted about  High risk medication use-will order TSH, lipid panel, hemoglobin A1c, prolactin.  Patient provided lab slip. Reviewed CMP, CBC with differential-08/31/2022-glucose elevated at 134, creatinine elevated at 1.15-otherwise within normal limits.  Creatinine slightly elevated at that time due to  patient with renal stone. Will consider repeating it in the future as needed.   Follow-up in clinic in 3 months or sooner if needed.  This note was generated in part or whole with voice recognition software. Voice recognition is usually quite accurate but there are transcription errors that can and very often do occur. I apologize for any typographical errors that were not detected and corrected.     Ursula Alert, MD 10/26/2022, 10:32 AM

## 2022-11-17 DIAGNOSIS — F3181 Bipolar II disorder: Secondary | ICD-10-CM | POA: Diagnosis not present

## 2022-11-17 DIAGNOSIS — F411 Generalized anxiety disorder: Secondary | ICD-10-CM | POA: Diagnosis not present

## 2022-11-17 DIAGNOSIS — F429 Obsessive-compulsive disorder, unspecified: Secondary | ICD-10-CM | POA: Diagnosis not present

## 2022-12-13 ENCOUNTER — Telehealth: Payer: Self-pay | Admitting: Psychiatry

## 2022-12-13 ENCOUNTER — Telehealth: Payer: Self-pay

## 2022-12-13 DIAGNOSIS — F429 Obsessive-compulsive disorder, unspecified: Secondary | ICD-10-CM | POA: Diagnosis not present

## 2022-12-13 DIAGNOSIS — F3181 Bipolar II disorder: Secondary | ICD-10-CM | POA: Diagnosis not present

## 2022-12-13 DIAGNOSIS — F411 Generalized anxiety disorder: Secondary | ICD-10-CM | POA: Diagnosis not present

## 2022-12-13 MED ORDER — QUETIAPINE FUMARATE ER 50 MG PO TB24: 50.0000 mg | ORAL_TABLET | Freq: Every day | ORAL | 0 refills | Status: DC

## 2022-12-13 NOTE — Telephone Encounter (Signed)
pt states she been having some manic spells. she been dizzyness, and irriablitiy. she wants to know if it is anything different she should or can be doing differently. she states she did see her therapist this week.

## 2022-12-13 NOTE — Telephone Encounter (Signed)
Spoke to patient, she believes she is hypomanic, cleaning too much as well as unable to sleep at night.  Agreeable to dosage increase of Seroquel, will increase to 350 mg at bedtime.  Will have patient scheduled for a sooner appointment next week so she can be reevaluated for further medication changes.

## 2022-12-14 NOTE — Telephone Encounter (Signed)
Contacted patient as noted in a separate phone note from yesterday 12/13/22

## 2022-12-20 DIAGNOSIS — K209 Esophagitis, unspecified without bleeding: Secondary | ICD-10-CM | POA: Diagnosis not present

## 2022-12-20 DIAGNOSIS — F319 Bipolar disorder, unspecified: Secondary | ICD-10-CM | POA: Diagnosis not present

## 2022-12-20 DIAGNOSIS — R7303 Prediabetes: Secondary | ICD-10-CM | POA: Diagnosis not present

## 2022-12-20 DIAGNOSIS — K219 Gastro-esophageal reflux disease without esophagitis: Secondary | ICD-10-CM | POA: Diagnosis not present

## 2022-12-20 DIAGNOSIS — I700 Atherosclerosis of aorta: Secondary | ICD-10-CM | POA: Diagnosis not present

## 2022-12-20 DIAGNOSIS — R251 Tremor, unspecified: Secondary | ICD-10-CM | POA: Diagnosis not present

## 2022-12-20 DIAGNOSIS — E785 Hyperlipidemia, unspecified: Secondary | ICD-10-CM | POA: Diagnosis not present

## 2022-12-20 DIAGNOSIS — Z0000 Encounter for general adult medical examination without abnormal findings: Secondary | ICD-10-CM | POA: Diagnosis not present

## 2022-12-20 DIAGNOSIS — M810 Age-related osteoporosis without current pathological fracture: Secondary | ICD-10-CM | POA: Diagnosis not present

## 2022-12-20 DIAGNOSIS — Z79899 Other long term (current) drug therapy: Secondary | ICD-10-CM | POA: Diagnosis not present

## 2022-12-20 DIAGNOSIS — F310 Bipolar disorder, current episode hypomanic: Secondary | ICD-10-CM | POA: Diagnosis not present

## 2022-12-20 DIAGNOSIS — F5101 Primary insomnia: Secondary | ICD-10-CM | POA: Diagnosis not present

## 2022-12-20 DIAGNOSIS — F324 Major depressive disorder, single episode, in partial remission: Secondary | ICD-10-CM | POA: Diagnosis not present

## 2022-12-22 ENCOUNTER — Ambulatory Visit (INDEPENDENT_AMBULATORY_CARE_PROVIDER_SITE_OTHER): Payer: Medicare Other | Admitting: Psychiatry

## 2022-12-22 ENCOUNTER — Encounter: Payer: Self-pay | Admitting: Psychiatry

## 2022-12-22 VITALS — BP 138/85 | HR 80 | Temp 97.8°F | Ht 65.0 in | Wt 149.4 lb

## 2022-12-22 DIAGNOSIS — R413 Other amnesia: Secondary | ICD-10-CM

## 2022-12-22 DIAGNOSIS — F411 Generalized anxiety disorder: Secondary | ICD-10-CM

## 2022-12-22 DIAGNOSIS — K222 Esophageal obstruction: Secondary | ICD-10-CM | POA: Insufficient documentation

## 2022-12-22 DIAGNOSIS — I700 Atherosclerosis of aorta: Secondary | ICD-10-CM | POA: Insufficient documentation

## 2022-12-22 DIAGNOSIS — F3181 Bipolar II disorder: Secondary | ICD-10-CM

## 2022-12-22 DIAGNOSIS — K209 Esophagitis, unspecified without bleeding: Secondary | ICD-10-CM | POA: Insufficient documentation

## 2022-12-22 DIAGNOSIS — F5101 Primary insomnia: Secondary | ICD-10-CM | POA: Diagnosis not present

## 2022-12-22 DIAGNOSIS — F308 Other manic episodes: Secondary | ICD-10-CM | POA: Insufficient documentation

## 2022-12-22 MED ORDER — QUETIAPINE FUMARATE ER 300 MG PO TB24: ORAL_TABLET | ORAL | 0 refills | Status: DC

## 2022-12-22 MED ORDER — GABAPENTIN 100 MG PO CAPS: 200.0000 mg | ORAL_CAPSULE | ORAL | 0 refills | Status: DC

## 2022-12-22 MED ORDER — BUPROPION HCL 75 MG PO TABS: 75.0000 mg | ORAL_TABLET | Freq: Every morning | ORAL | 1 refills | Status: DC

## 2022-12-22 MED ORDER — LAMOTRIGINE 200 MG PO TABS: 200.0000 mg | ORAL_TABLET | Freq: Two times a day (BID) | ORAL | 1 refills | Status: DC

## 2022-12-22 NOTE — Patient Instructions (Signed)
Gabapentin Capsules or Tablets What is this medication? GABAPENTIN (GA ba pen tin) treats nerve pain. It may also be used to prevent and control seizures in people with epilepsy. It works by calming overactive nerves in your body. This medicine may be used for other purposes; ask your health care provider or pharmacist if you have questions. COMMON BRAND NAME(S): Active-PAC with Gabapentin, Gabarone, Gralise, Neurontin What should I tell my care team before I take this medication? They need to know if you have any of these conditions: Alcohol or substance use disorder Kidney disease Lung or breathing disease Suicidal thoughts, plans, or attempt; a previous suicide attempt by you or a family member An unusual or allergic reaction to gabapentin, other medications, foods, dyes, or preservatives Pregnant or trying to get pregnant Breast-feeding How should I use this medication? Take this medication by mouth with a glass of water. Follow the directions on the prescription label. You can take it with or without food. If it upsets your stomach, take it with food. Take your medication at regular intervals. Do not take it more often than directed. Do not stop taking except on your care team's advice. If you are directed to break the 600 or 800 mg tablets in half as part of your dose, the extra half tablet should be used for the next dose. If you have not used the extra half tablet within 28 days, it should be thrown away. A special MedGuide will be given to you by the pharmacist with each prescription and refill. Be sure to read this information carefully each time. Talk to your care team about the use of this medication in children. While this medication may be prescribed for children as young as 3 years for selected conditions, precautions do apply. Overdosage: If you think you have taken too much of this medicine contact a poison control center or emergency room at once. NOTE: This medicine is only for  you. Do not share this medicine with others. What if I miss a dose? If you miss a dose, take it as soon as you can. If it is almost time for your next dose, take only that dose. Do not take double or extra doses. What may interact with this medication? Alcohol Antihistamines for allergy, cough, and cold Certain medications for anxiety or sleep Certain medications for depression like amitriptyline, fluoxetine, sertraline Certain medications for seizures like phenobarbital, primidone Certain medications for stomach problems General anesthetics like halothane, isoflurane, methoxyflurane, propofol Local anesthetics like lidocaine, pramoxine, tetracaine Medications that relax muscles for surgery Opioid medications for pain Phenothiazines like chlorpromazine, mesoridazine, prochlorperazine, thioridazine This list may not describe all possible interactions. Give your health care provider a list of all the medicines, herbs, non-prescription drugs, or dietary supplements you use. Also tell them if you smoke, drink alcohol, or use illegal drugs. Some items may interact with your medicine. What should I watch for while using this medication? Visit your care team for regular checks on your progress. You may want to keep a record at home of how you feel your condition is responding to treatment. You may want to share this information with your care team at each visit. You should contact your care team if your seizures get worse or if you have any new types of seizures. Do not stop taking this medication or any of your seizure medications unless instructed by your care team. Stopping your medication suddenly can increase your seizures or their severity. This medication may cause serious skin   reactions. They can happen weeks to months after starting the medication. Contact your care team right away if you notice fevers or flu-like symptoms with a rash. The rash may be red or purple and then turn into blisters or  peeling of the skin. Or, you might notice a red rash with swelling of the face, lips or lymph nodes in your neck or under your arms. Wear a medical identification bracelet or chain if you are taking this medication for seizures. Carry a card that lists all your medications. This medication may affect your coordination, reaction time, or judgment. Do not drive or operate machinery until you know how this medication affects you. Sit up or stand slowly to reduce the risk of dizzy or fainting spells. Drinking alcohol with this medication can increase the risk of these side effects. Your mouth may get dry. Chewing sugarless gum or sucking hard candy, and drinking plenty of water may help. Watch for new or worsening thoughts of suicide or depression. This includes sudden changes in mood, behaviors, or thoughts. These changes can happen at any time but are more common in the beginning of treatment or after a change in dose. Call your care team right away if you experience these thoughts or worsening depression. If you become pregnant while using this medication, you may enroll in the [place] Antiepileptic Drug Pregnancy Registry by calling 1-888-233-2334. This registry collects information about the safety of antiepileptic medication use during pregnancy. What side effects may I notice from receiving this medication? Side effects that you should report to your care team as soon as possible: Allergic reactions or angioedema--skin rash, itching, hives, swelling of the face, eyes, lips, tongue, arms, or legs, trouble swallowing or breathing Rash, fever, and swollen lymph nodes Thoughts of suicide or self harm, worsening mood, feelings of depression Trouble breathing Unusual changes in mood or behavior in children after use such as difficulty concentrating, hostility, or restlessness Side effects that usually do not require medical attention (report to your care team if they continue or are  bothersome): Dizziness Drowsiness Nausea Swelling of ankles, feet, or hands Vomiting This list may not describe all possible side effects. Call your doctor for medical advice about side effects. You may report side effects to FDA at 1-800-FDA-1088. Where should I keep my medication? Keep out of reach of children and pets. Store at room temperature between 15 and 30 degrees C (59 and 86 degrees F). Get rid of any unused medication after the expiration date. This medication may cause accidental overdose and death if taken by other adults, children, or pets. To get rid of medications that are no longer needed or have expired: Take the medication to a medication take-back program. Check with your pharmacy or law enforcement to find a location. If you cannot return the medication, check the label or package insert to see if the medication should be thrown out in the garbage or flushed down the toilet. If you are not sure, ask your care team. If it is safe to put it in the trash, empty the medication out of the container. Mix the medication with cat litter, dirt, coffee grounds, or other unwanted substance. Seal the mixture in a bag or container. Put it in the trash. NOTE: This sheet is a summary. It may not cover all possible information. If you have questions about this medicine, talk to your doctor, pharmacist, or health care provider.  2023 Elsevier/Gold Standard (2021-04-12 00:00:00)  

## 2022-12-22 NOTE — Progress Notes (Signed)
[facility] MD OP Progress Note  12/22/2022 1:24 PM [name]  MRN:  [id number]  Chief Complaint:  Chief Complaint  Patient presents with   Follow-up   Manic Behavior   Insomnia   Medication Refill   HPI: [name] is a 61 year old Caucasian female on disability, married, currently lives in [place] has a history of bipolar disorder type II, GAD, primary insomnia, hyperlipidemia, osteoporosis, obstructive sleep apnea on CPAP, history of cataract surgery, was evaluated in the office today.  This appointment was scheduled since patient contacted the office reporting worsening sleep problems and anxiety.  Patient today in session appeared to be anxious, fidgety.  Reports she has been having hypomanic symptoms since the past several days, is often restless, anxious, needs to keep doing something around the house.  She also reports difficulty falling asleep.  It takes her around 3 hours after taking her medications to fall asleep.  Once she is able to fall asleep she is able to stay asleep for 6 hours to 7 hours.  That is an improvement.  The higher dosage of Seroquel does help.  Does not believe Seroquel higher dosages contributing to any side effects.  Denies any significant sadness or lack of motivation.  She reports she is going through her creative phase and is motivated to do certain activities likely due to being hypomanic.  She does struggle with her concentration.  She does worry about overeating and is currently trying to watch her diet since she does not want to gain weight.  Patient reports her husband is very supportive and that is extremely beneficial.  Denies any suicidality, homicidality or perceptual disturbances.  Patient denies any other concerns today.  Visit Diagnosis:    ICD-10-CM   1. Mild hypomanic bipolar II disorder (HCC)  F31.81 QUEtiapine (SEROQUEL XR) 300 MG 24 hr tablet   with anxious distress    2. GAD (generalized anxiety disorder)  F41.1  gabapentin (NEURONTIN) 100 MG capsule    3. Primary insomnia  F51.01 lamoTRIgine (LAMICTAL) 200 MG tablet    4. Memory loss  R41.3 buPROPion (WELLBUTRIN) 75 MG tablet   moderate      Past Psychiatric History: Reviewed past psychiatric history from progress note on 09/01/2021.  Past trials of lithium-side effect, Ativan-side effects, Depakote-gained weight, Trileptal, Latuda-akathisia, Celexa, Effexor-made her manic, trazodone, Tranxene, Zoloft, Prozac, BuSpar not helpful, lamotrigine-restless, Seroquel, Abilify, TCAs.  Past Medical History:  Past Medical History:  Diagnosis Date   Bipolar 1 disorder ([place])    Hyperlipidemia    OSA (obstructive sleep apnea)    CPAP dependent   Osteoporosis    PPD positive     Past Surgical History:  Procedure Laterality Date   ABDOMINAL HYSTERECTOMY     CATARACT EXTRACTION, BILATERAL     due to seroquel   ROBOTIC ASSISTED LAPAROSCOPIC HYSTERECTOMY AND SALPINGECTOMY      Family Psychiatric History: Reviewed family psychiatric history from progress note on 09/01/2021.  Family History:  Family History  Problem Relation Age of Onset   Personality disorder Mother    Alcohol abuse Maternal Uncle    Bipolar disorder Cousin    Schizophrenia Other     Social History: Reviewed social history from progress note on 09/01/2021. Social History   Socioeconomic History   Marital status: Married    Spouse name: eric   Number of children: 2   Years of education: Not on file   Highest education level: Bachelor's degree (e.g., BA, AB, BS)  Occupational History  Comment: unemployed  Tobacco Use   Smoking status: Never   Smokeless tobacco: Never  Vaping Use   Vaping Use: Never used  Substance and Sexual Activity   Alcohol use: No   Drug use: No   Sexual activity: Yes  Other Topics Concern   Not on file  Social History Narrative   Not on file   Social Determinants of Health   Financial Resource Strain: Not on file  Food Insecurity: Not on  file  Transportation Needs: Not on file  Physical Activity: Not on file  Stress: Not on file  Social Connections: Not on file    Allergies:  Allergies  Allergen Reactions   Tyloxapol Rash   Prilosec [Omeprazole]     Interacts with psych meds   Tylox [Oxycodone-Acetaminophen] Rash    Metabolic Disorder Labs: No results found for: "HGBA1C", "MPG" Lab Results  Component Value Date   PROLACTIN 5.1 09/01/2021   No results found for: "CHOL", "TRIG", "HDL", "CHOLHDL", "VLDL", "LDLCALC" Lab Results  Component Value Date   TSH 0.955 09/01/2021    Therapeutic Level Labs: No results found for: "LITHIUM" No results found for: "VALPROATE" No results found for: "CBMZ"  Current Medications: Current Outpatient Medications  Medication Sig Dispense Refill   alendronate (FOSAMAX) 70 MG tablet TAKE 1 TABLET BY MOUTH ONCE WEEKLY 30 MINUTES BEFORE FIRST FOOD, MEDICINE, OR WATER OF THE DAY     Calcium Carb-Cholecalciferol 600-10 MG-MCG TABS Take 1 tablet by mouth daily.     gabapentin (NEURONTIN) 100 MG capsule Take 2 capsules (200 mg total) by mouth as directed. Take 1 cap daily at 1 PM and 1 cap daily at bedtime 180 capsule 0   hydrOXYzine (ATARAX) 25 MG tablet Take 1-2 tablets (25-50 mg total) by mouth at bedtime as needed. For sleep, anxiety 60 tablet 1   mirtazapine (REMERON) 7.5 MG tablet Take 1 tablet (7.5 mg total) by mouth at bedtime. For sleep and mood (Patient taking differently: Take 7.5 mg by mouth at bedtime. For sleep and mood, takes as needed) 90 tablet 1   Multiple Vitamin (MULTIVITAMIN) tablet Take 1 tablet by mouth daily.     QUEtiapine (SEROQUEL XR) 50 MG TB24 24 hr tablet Take 1 tablet (50 mg total) by mouth at bedtime. Take along with 300 mg at bedtime 30 tablet 0   simvastatin (ZOCOR) 40 MG tablet Take 40 mg by mouth daily.     buPROPion (WELLBUTRIN) 75 MG tablet Take 1 tablet (75 mg total) by mouth in the morning. 90 tablet 1   lamoTRIgine (LAMICTAL) 200 MG tablet Take 1  tablet (200 mg total) by mouth 2 (two) times daily. 180 tablet 1   QUEtiapine (SEROQUEL XR) 300 MG 24 hr tablet TAKE 1 TABLET(300 MG) BY MOUTH AT BEDTIME 90 tablet 0   valACYclovir (VALTREX) 500 MG tablet 1 tablet Orally Twice a day as needed for outbreak     No current facility-administered medications for this visit.     Musculoskeletal: Strength & Muscle Tone: within normal limits Gait & Station: normal Patient leans: N/A  Psychiatric Specialty Exam: Review of Systems  Psychiatric/Behavioral:  Positive for sleep disturbance. The patient is nervous/anxious.        Feels hypomanic  All other systems reviewed and are negative.   Blood pressure 138/85, pulse 80, temperature 97.8 F (36.6 C), temperature source Skin, height '5\' 5"'$  (1.651 m), weight 149 lb 6.4 oz (67.8 kg).Body mass index is 24.86 kg/m.  General Appearance: Casual  Eye  Contact:  Fair  Speech:  Clear and Coherent  Volume:  Normal  Mood:  Anxious, feels hypomanic  Affect:  Appropriate  Thought Process:  Goal Directed and Descriptions of Associations: Intact  Orientation:  Full (Time, Place, and Person)  Thought Content: Logical   Suicidal Thoughts:  No  Homicidal Thoughts:  No  Memory:  Immediate;   Fair Recent;   Fair Remote;   Fair  Judgement:  Fair  Insight:  Fair  Psychomotor Activity:  Restlessness  Concentration:  Concentration: Fair and Attention Span: Fair  Recall:  [employer] of Knowledge: Fair  Language: Fair  Akathisia:  No  Handed:  Right  AIMS (if indicated):  done  Assets:  Communication Skills Desire for Improvement Social Support  ADL's:  Intact  Cognition: WNL  Sleep:   Has difficulty falling asleep, however once asleep is able to maintain sleep.   Screenings: [facility] Office Visit from 12/22/2022 in [facility] Office Visit from 10/26/2022 in [facility] Office Visit from 07/25/2022 in [facility] [place] Office Visit from 06/22/2022 in [facility] Office Visit from 05/11/2022 in [facility] Total Score 0 0 0 0 0      [facility] Office Visit from 12/22/2022 in [facility] Office Visit from 10/26/2022 in [facility] Office Visit from 07/25/2022 in [facility] Office Visit from 06/22/2022 in [facility] Office Visit from 05/11/2022 in [facility] [place]  Total GAD-7 Score '20 9 8 14 16      '$ PHQ2-9    [facility] Office Visit from 12/22/2022 in [facility] Office Visit from 10/26/2022 in [facility] Office Visit from 07/25/2022 in [facility] Office Visit from 06/22/2022 in [facility] Office Visit from 05/11/2022 in [facility]  PHQ-2 Total Score 0 '2 2 2 5  '$ PHQ-9 Total Score '9 10 9 9 18      '$ [facility] Office Visit from 12/22/2022 in [facility] Office Visit from 10/26/2022 in [facility] ED from 08/31/2022 in [facility] Emergency Department at [place] No Risk No Risk No Risk        Assessment and Plan: [name] is a 62 year old Caucasian female, married, on disability, lives in [place], has a history of bipolar disorder type II, GAD, multiple medical problems including obstructive sleep apnea on CPAP was evaluated in the office today.  Patient is currently struggling with hypomanic symptoms, sleep problems, anxiety, will benefit from  the following plan.  Plan Bipolar disorder type II, hypomanic, with anxious distress, mild-unstable Continue Seroquel extended release 350 mg p.o. nightly Continue lamotrigine 200 mg p.o. twice daily-with plan to taper it off or reduce dosage in the future. Add gabapentin 100 mg p.o. twice daily.  If she does have a good response to gabapentin we will consider increasing the dosage and tapering down the lamotrigine. Continue mirtazapine 7.5 mg p.o. nightly-she currently uses it only as needed. Wellbutrin 75 mg p.o. daily in the morning  GAD-unstable Added gabapentin as noted above Hydroxyzine 25-50 mg p.o. nightly  as needed for sleep Continue therapy with Dr. Jeanie Cooks [facility].  Insomnia-improving Continue sleep hygiene techniques, discussed sleep restriction-staying out of bed until she can fall asleep doing less stimulating activities like coloring, reading a book, listening to music CPAP for OSA to be continued Mirtazapine 7.5 mg p.o. nightly as needed  High risk medication use-I have reviewed labs received from primary care provider-[facility]-dated 12/21/2022-prolactin level-7.93-within normal limits TSH-2.15-within normal limits, hemoglobin A1c-elevated at 5.9, CMP-within normal limits, vitamin D-40.9-within normal limits, lipid panel-within normal limits.  Follow-up in clinic in 3 to 4 weeks or sooner if needed.     Patient/Guardian was advised Release of Information must be obtained prior to any record release in order to collaborate their care with an outside provider. Patient/Guardian was advised if they have not already done so to contact the registration department to sign all necessary forms in order for [place] to release information regarding their care.   Consent: Patient/Guardian gives verbal consent for treatment and assignment of benefits for services provided during this visit. Patient/Guardian expressed understanding and agreed to proceed.   This note was  generated in part or whole with voice recognition software. Voice recognition is usually quite accurate but there are transcription errors that can and very often do occur. I apologize for any typographical errors that were not detected and corrected.      Ursula Alert, MD 12/23/2022, 8:36 AM

## 2022-12-29 ENCOUNTER — Other Ambulatory Visit: Payer: Self-pay | Admitting: Internal Medicine

## 2022-12-29 DIAGNOSIS — M810 Age-related osteoporosis without current pathological fracture: Secondary | ICD-10-CM

## 2023-01-03 ENCOUNTER — Other Ambulatory Visit: Payer: Self-pay | Admitting: Internal Medicine

## 2023-01-03 DIAGNOSIS — Z1231 Encounter for screening mammogram for malignant neoplasm of breast: Secondary | ICD-10-CM

## 2023-01-04 DIAGNOSIS — G4733 Obstructive sleep apnea (adult) (pediatric): Secondary | ICD-10-CM | POA: Diagnosis not present

## 2023-01-08 ENCOUNTER — Other Ambulatory Visit: Payer: Self-pay | Admitting: Psychiatry

## 2023-01-08 DIAGNOSIS — F3181 Bipolar II disorder: Secondary | ICD-10-CM

## 2023-01-09 DIAGNOSIS — F3181 Bipolar II disorder: Secondary | ICD-10-CM | POA: Diagnosis not present

## 2023-01-10 ENCOUNTER — Ambulatory Visit: Payer: Medicare Other | Admitting: Psychiatry

## 2023-01-10 ENCOUNTER — Encounter: Payer: Self-pay | Admitting: Psychiatry

## 2023-01-10 VITALS — BP 137/71 | HR 81 | Temp 98.3°F | Ht 65.0 in | Wt 147.0 lb

## 2023-01-10 DIAGNOSIS — Z79899 Other long term (current) drug therapy: Secondary | ICD-10-CM | POA: Diagnosis not present

## 2023-01-10 DIAGNOSIS — F5101 Primary insomnia: Secondary | ICD-10-CM

## 2023-01-10 DIAGNOSIS — F411 Generalized anxiety disorder: Secondary | ICD-10-CM

## 2023-01-10 DIAGNOSIS — F3181 Bipolar II disorder: Secondary | ICD-10-CM

## 2023-01-10 MED ORDER — LAMOTRIGINE 100 MG PO TABS: 100.0000 mg | ORAL_TABLET | Freq: Every day | ORAL | 1 refills | Status: DC

## 2023-01-10 MED ORDER — DIVALPROEX SODIUM ER 500 MG PO TB24: 500.0000 mg | ORAL_TABLET | Freq: Every day | ORAL | 1 refills | Status: DC

## 2023-01-10 NOTE — Progress Notes (Signed)
[facility] MD OP Progress Note  01/10/2023 2:24 PM [name]  MRN:  [id number]  Chief Complaint:  Chief Complaint  Patient presents with   Follow-up   Depression   Fatigue   Medication Refill   HPI: [name] is a 62 year old Caucasian female on disability, married, currently lives in [place], has a history of bipolar disorder type II, GAD, primary insomnia, hyperlipidemia, osteoporosis, obstructive sleep apnea on CPAP, history of cataract surgery was evaluated in office today.  Patient was last evaluated on 12/22/2022.  At that visit patient was started on gabapentin for mood stabilization.  Patient today returns reporting that she has been feeling worse since the past 2 weeks.  She is likely having side effects to the gabapentin.  Reports extreme fatigue or tiredness during the day.  She also has had word-finding difficulties, inability to function during the day, feels slowed down.  Patient reports she had an appointment with her sleep provider and her CPAP is up to date.  She was not advised to make any changes.  She is getting good enough sleep at night.  In spite of that she feels tired during the day.  She has been compliant on the CPAP and that has also not made a difference.  Patient reports she is currently compliant on all her medications.  She reports gabapentin likely contributing to the side effects.  Otherwise denies any other concerns from any other medications she is on.  Patient reports her husband has also observed a change in her.  Denies any suicidality, homicidality or perceptual disturbances.  Patient denies any other concerns today. Visit Diagnosis:    ICD-10-CM   1. Mild hypomanic bipolar II disorder (HCC)  F31.81 lamoTRIgine (LAMICTAL) 100 MG tablet    divalproex (DEPAKOTE ER) 500 MG 24 hr tablet    Valproic acid level    Lamotrigine level    Sodium    Platelet count    Hepatic function panel   current depressed with anxious distress    2.  GAD (generalized anxiety disorder)  F41.1 Lamotrigine level    Platelet count    3. Primary insomnia  F51.01     4. High risk medication use  Z79.899 Valproic acid level    Lamotrigine level    Sodium    Platelet count    Hepatic function panel      Past Psychiatric History: I have reviewed past psychiatric history from progress note on 09/01/2021.  Past trials of lithium-side effect, Ativan-side effect, Depakote-gained weight, Trileptal, Latuda-akathisia, Celexa, Effexor-made her manic, trazodone, Tranxene, Zoloft, Prozac, BuSpar-not helpful, lamotrigine-restless, Seroquel, Abilify, TCAs.  Past Medical History:  Past Medical History:  Diagnosis Date   Bipolar 1 disorder ([place])    Hyperlipidemia    OSA (obstructive sleep apnea)    CPAP dependent   Osteoporosis    PPD positive     Past Surgical History:  Procedure Laterality Date   ABDOMINAL HYSTERECTOMY     CATARACT EXTRACTION, BILATERAL     due to seroquel   ROBOTIC ASSISTED LAPAROSCOPIC HYSTERECTOMY AND SALPINGECTOMY      Family Psychiatric History: I have reviewed family psychiatric history from progress note on 09/01/2021.  Family History:  Family History  Problem Relation Age of Onset   Personality disorder Mother    Alcohol abuse Maternal Uncle    Bipolar disorder Cousin    Schizophrenia Other     Social History: I have reviewed social history from progress note on 09/01/2021. Social History  Socioeconomic History   Marital status: Married    Spouse name: eric   Number of children: 2   Years of education: Not on file   Highest education level: Bachelor's degree (e.g., BA, AB, BS)  Occupational History    Comment: unemployed  Tobacco Use   Smoking status: Never   Smokeless tobacco: Never  Vaping Use   Vaping Use: Never used  Substance and Sexual Activity   Alcohol use: No   Drug use: No   Sexual activity: Yes  Other Topics Concern   Not on file  Social History Narrative   Not on file   Social  Determinants of Health   Financial Resource Strain: Not on file  Food Insecurity: Not on file  Transportation Needs: Not on file  Physical Activity: Not on file  Stress: Not on file  Social Connections: Not on file    Allergies:  Allergies  Allergen Reactions   Tyloxapol Rash   Prilosec [Omeprazole]     Interacts with psych meds   Tylox [Oxycodone-Acetaminophen] Rash    Metabolic Disorder Labs: No results found for: "HGBA1C", "MPG" Lab Results  Component Value Date   PROLACTIN 5.1 09/01/2021   No results found for: "CHOL", "TRIG", "HDL", "CHOLHDL", "VLDL", "LDLCALC" Lab Results  Component Value Date   TSH 0.955 09/01/2021    Therapeutic Level Labs: No results found for: "LITHIUM" No results found for: "VALPROATE" No results found for: "CBMZ"  Current Medications: Current Outpatient Medications  Medication Sig Dispense Refill   alendronate (FOSAMAX) 70 MG tablet TAKE 1 TABLET BY MOUTH ONCE WEEKLY 30 MINUTES BEFORE FIRST FOOD, MEDICINE, OR WATER OF THE DAY     buPROPion (WELLBUTRIN) 75 MG tablet Take 1 tablet (75 mg total) by mouth in the morning. 90 tablet 1   Calcium Carb-Cholecalciferol 600-10 MG-MCG TABS Take 1 tablet by mouth daily.     divalproex (DEPAKOTE ER) 500 MG 24 hr tablet Take 1 tablet (500 mg total) by mouth daily with supper. 30 tablet 1   hydrOXYzine (ATARAX) 25 MG tablet Take 1-2 tablets (25-50 mg total) by mouth at bedtime as needed. For sleep, anxiety 60 tablet 1   lamoTRIgine (LAMICTAL) 100 MG tablet Take 1 tablet (100 mg total) by mouth daily. Tapering off , patient has supplies 30 tablet 1   Multiple Vitamin (MULTIVITAMIN) tablet Take 1 tablet by mouth daily.     QUEtiapine (SEROQUEL XR) 300 MG 24 hr tablet TAKE 1 TABLET(300 MG) BY MOUTH AT BEDTIME 90 tablet 0   QUEtiapine (SEROQUEL XR) 50 MG TB24 24 hr tablet TAKE 1 TABLET BY MOUTH AT BEDTIME. TAKE ALONG WITH 300 MG AT BEDTIME 90 tablet 0   simvastatin (ZOCOR) 40 MG tablet Take 40 mg by mouth  daily.     valACYclovir (VALTREX) 500 MG tablet 1 tablet Orally Twice a day as needed for outbreak     No current facility-administered medications for this visit.     Musculoskeletal: Strength & Muscle Tone: within normal limits Gait & Station: normal Patient leans: N/A  Psychiatric Specialty Exam: Review of Systems  Constitutional:  Positive for fatigue.  Psychiatric/Behavioral:  Positive for dysphoric mood.   All other systems reviewed and are negative.   Blood pressure 137/71, pulse 81, temperature 98.3 F (36.8 C), temperature source Skin, height 5\' 5"  (1.651 m), weight 147 lb (66.7 kg).Body mass index is 24.46 kg/m.  General Appearance: Casual  Eye Contact:  Fair  Speech:  Normal Rate  Volume:  Normal  Mood:  Depressed and Dysphoric  Affect:  Depressed  Thought Process:  Goal Directed and Descriptions of Associations: Intact  Orientation:  Full (Time, Place, and Person)  Thought Content: Logical   Suicidal Thoughts:  No  Homicidal Thoughts:  No  Memory:  Immediate;   Fair Recent;   Fair Remote;   Fair  Judgement:  Fair  Insight:  Fair  Psychomotor Activity:  Normal  Concentration:  Concentration: Fair and Attention Span: Fair  Recall:  [employer] of Knowledge: Fair  Language: Fair  Akathisia:  No  Handed:  Right  AIMS (if indicated):  done  Assets:  Communication Skills Desire for Improvement Housing Social Support  ADL's:  Intact  Cognition: WNL  Sleep:  Fair   Screenings: [place] Visit from 01/10/2023 in [place] Office Visit from 12/22/2022 in [place] Office Visit from 10/26/2022 in [place] Office Visit from 07/25/2022 in [place] Office Visit from 06/22/2022 in [place] Total Score 0 0 0 0 0      [place] Office Visit from 01/10/2023 in [place] Office Visit from 12/22/2022 in [place] Office Visit from 10/26/2022 in [place] Office Visit from 07/25/2022 in [place] Office Visit from 06/22/2022 in [place]  Total GAD-7 Score 13 20 9 8 14       PHQ2-9    [place] Office Visit from 01/10/2023 in [place] Office Visit from 12/22/2022 in [place] Office Visit from 10/26/2022 in [place] Office Visit from 07/25/2022 in [place] Office Visit from 06/22/2022 in [place]  PHQ-2 Total Score 1 0 2 2 2   PHQ-9 Total Score 8 9 10 9 9       [place] Office Visit from 01/10/2023 in [place] Office Visit from 12/22/2022 in [place] Office Visit from 10/26/2022 in [place] No Risk No Risk No Risk        Assessment and Plan: [name] is a 62 year old Caucasian female, married, on disability, lives in [place], has a history of bipolar disorder type II, GAD, multiple medical problems including obstructive sleep apnea on CPAP was evaluated in office today.  Patient is currently struggling with worsening depression symptoms possible side effects to gabapentin, will benefit from following plan.  Plan Bipolar disorder type II, hypomanic with anxious distress, moderate-unstable Continue Seroquel extended release 350 mg p.o. nightly Discontinued gabapentin for side effects. Taper of lamotrigine, will reduce  lamotrigine 100 mg p.o. daily in divided dosage.  Patient currently has supplies available. Start Depakote ER 500 mg p.o. daily with supper.  If she has a good response ,could increase the dosage in the future.  Discussed with patient adverse side effects of Depakote, including drug to drug interaction with Lamictal. Wellbutrin 75 mg p.o. daily in the morning  GAD-unstable Hydroxyzine 25-50 mg p.o. nightly as needed for sleep Start Depakote ER 500 mg p.o. daily with supper Continue CBT with [place].  Insomnia-improving Continue CPAP for OSA Discontinue mirtazapine  for noncompliance and side effects. Continue sleep hygiene techniques  High risk use-patient advised to go to [facility] lab-5 days after starting Depakote in the morning-will order Depakote level, LFT, sodium level, platelet count.  Follow-up in clinic in 2 weeks or sooner if needed.    Consent: Patient/Guardian gives verbal consent for treatment and assignment of benefits for services provided during this visit. Patient/Guardian expressed understanding and agreed to proceed.   This note was generated in part or whole with voice recognition software. Voice recognition is usually quite accurate but there are transcription errors that can and very often do occur. I apologize for any typographical errors that were not detected and corrected.    Ursula Alert, MD 01/10/2023, 2:24 PM

## 2023-01-17 ENCOUNTER — Other Ambulatory Visit
Admission: RE | Admit: 2023-01-17 | Discharge: 2023-01-17 | Disposition: A | Discharge status: Home / Self Care | Payer: Medicare Other | Attending: Psychiatry | Admitting: Psychiatry

## 2023-01-17 DIAGNOSIS — F411 Generalized anxiety disorder: Secondary | ICD-10-CM

## 2023-01-17 DIAGNOSIS — Z79899 Other long term (current) drug therapy: Secondary | ICD-10-CM | POA: Diagnosis not present

## 2023-01-17 DIAGNOSIS — F3181 Bipolar II disorder: Secondary | ICD-10-CM | POA: Diagnosis not present

## 2023-01-17 LAB — HEPATIC FUNCTION PANEL
ALT: 20 U/L (ref 0–44)
AST: 20 U/L (ref 15–41)
Albumin: 4.2 g/dL (ref 3.5–5.0)
Alkaline Phosphatase: 59 U/L (ref 38–126)
Bilirubin, Direct: <0.1 mg/dL (ref 0.0–0.2)
Total Bilirubin: 0.7 mg/dL (ref 0.3–1.2)
Total Protein: 7.3 g/dL (ref 6.5–8.1)

## 2023-01-17 LAB — PLATELET COUNT: Platelets: 273 10*3/uL (ref 150–400)

## 2023-01-17 LAB — SODIUM: Sodium: 140 mmol/L (ref 135–145)

## 2023-01-17 LAB — VALPROIC ACID LEVEL: Valproic Acid Lvl: 69 ug/mL (ref 50.0–100.0)

## 2023-01-18 LAB — LAMOTRIGINE LEVEL: Lamotrigine Lvl: 12.9 ug/mL (ref 2.0–20.0)

## 2023-01-25 ENCOUNTER — Ambulatory Visit: Payer: Medicare Other | Admitting: Psychiatry

## 2023-02-01 ENCOUNTER — Encounter: Payer: Self-pay | Admitting: Psychiatry

## 2023-02-01 ENCOUNTER — Ambulatory Visit (INDEPENDENT_AMBULATORY_CARE_PROVIDER_SITE_OTHER): Payer: Medicare Other | Admitting: Psychiatry

## 2023-02-01 VITALS — BP 135/84 | HR 74 | Temp 97.2°F | Ht 65.0 in | Wt 154.6 lb

## 2023-02-01 DIAGNOSIS — F5101 Primary insomnia: Secondary | ICD-10-CM | POA: Diagnosis not present

## 2023-02-01 DIAGNOSIS — F411 Generalized anxiety disorder: Secondary | ICD-10-CM | POA: Diagnosis not present

## 2023-02-01 DIAGNOSIS — F3181 Bipolar II disorder: Secondary | ICD-10-CM | POA: Diagnosis not present

## 2023-02-01 DIAGNOSIS — Z79899 Other long term (current) drug therapy: Secondary | ICD-10-CM

## 2023-02-01 MED ORDER — LAMOTRIGINE 100 MG PO TABS: 50.0000 mg | ORAL_TABLET | Freq: Every day | ORAL | Status: DC

## 2023-02-01 MED ORDER — LAMOTRIGINE 25 MG PO TABS: 25.0000 mg | ORAL_TABLET | Freq: Every day | ORAL | 0 refills | Status: DC

## 2023-02-01 NOTE — Progress Notes (Signed)
[facility] MD OP Progress Note  02/01/2023 9:30 AM [name]  MRN:  [id number]  Chief Complaint:  Chief Complaint  Patient presents with   Follow-up   Anxiety   Depression   Medication Refill   HPI: [name] is a 62 year old Caucasian female on disability, married, currently lives in [place], has a history of bipolar disorder type II, GAD, primary insomnia, hyperlipidemia, osteoporosis, obstructive sleep apnea on CPAP, history of cataract surgery was evaluated in office today.  Patient today reports she is currently feeling much better with regards to her mood.  Does not feel manic or anxious at this time.  She reports she does not have a lot of racing thoughts like she used to before.  She has been keeping herself busy, working outside in her yard, trying to work on gardening projects.  That does seem to help.  She is tolerating the Depakote well.  Denies side effects.  Patient reports sleep is good.  Reports she is currently using her CPAP regularly.  Patient denies any suicidality, homicidality or perceptual disturbances.  Patient continues to follow-up with her therapist, reports therapy sessions are beneficial.  She reports she is worried about her weight gain, looks like she has gained a few pounds in the past few weeks.  She does not do any regular exercise at this time.  She also is not watching her diet.  She is motivated to do so.  She denies any other concerns today.  Visit Diagnosis:    ICD-10-CM   1. Bipolar II disorder in full remission  F31.81 lamoTRIgine (LAMICTAL) 100 MG tablet    lamoTRIgine (LAMICTAL) 25 MG tablet   most recent hypomanic with anxious distress    2. GAD (generalized anxiety disorder)  F41.1     3. Primary insomnia  F51.01     4. High risk medication use  Z79.899       Past Psychiatric History: I have reviewed past psychiatric history from progress note on 09/01/2021.  Past trials of lithium-side effect, Ativan-side effect,  Depakote-gained weight, Trileptal, Latuda-akathisia, Celexa, Effexor-made her manic, trazodone, Tranxene, Zoloft, Prozac, BuSpar-not helpful, lamotrigine-restless, Seroquel, Abilify, TCAs.  Past Medical History:  Past Medical History:  Diagnosis Date   Bipolar 1 disorder    Hyperlipidemia    OSA (obstructive sleep apnea)    CPAP dependent   Osteoporosis    PPD positive     Past Surgical History:  Procedure Laterality Date   ABDOMINAL HYSTERECTOMY     CATARACT EXTRACTION, BILATERAL     due to seroquel   ROBOTIC ASSISTED LAPAROSCOPIC HYSTERECTOMY AND SALPINGECTOMY      Family Psychiatric History: I have reviewed family psychiatric history from progress note on 09/01/2021.  Family History:  Family History  Problem Relation Age of Onset   Personality disorder Mother    Alcohol abuse Maternal Uncle    Bipolar disorder Cousin    Schizophrenia Other     Social History: I have reviewed social history from progress note on 09/01/2021. Social History   Socioeconomic History   Marital status: Married    Spouse name: eric   Number of children: 2   Years of education: Not on file   Highest education level: Bachelor's degree (e.g., BA, AB, BS)  Occupational History    Comment: unemployed  Tobacco Use   Smoking status: Never   Smokeless tobacco: Never  Vaping Use   Vaping Use: Never used  Substance and Sexual Activity   Alcohol use: No  Drug use: No   Sexual activity: Yes  Other Topics Concern   Not on file  Social History Narrative   Not on file   Social Determinants of Health   Financial Resource Strain: Not on file  Food Insecurity: Not on file  Transportation Needs: Not on file  Physical Activity: Not on file  Stress: Not on file  Social Connections: Not on file    Allergies:  Allergies  Allergen Reactions   Tyloxapol Rash   Prilosec [Omeprazole]     Interacts with psych meds   Tylox [Oxycodone-Acetaminophen] Rash    Metabolic Disorder Labs: No results  found for: "HGBA1C", "MPG" Lab Results  Component Value Date   PROLACTIN 5.1 09/01/2021   No results found for: "CHOL", "TRIG", "HDL", "CHOLHDL", "VLDL", "LDLCALC" Lab Results  Component Value Date   TSH 0.955 09/01/2021    Therapeutic Level Labs: No results found for: "LITHIUM" Lab Results  Component Value Date   VALPROATE 69 01/17/2023   No results found for: "CBMZ"  Current Medications: Current Outpatient Medications  Medication Sig Dispense Refill   [START ON 02/14/2023] lamoTRIgine (LAMICTAL) 25 MG tablet Take 1 tablet (25 mg total) by mouth daily for 14 days. 14 tablet 0   alendronate (FOSAMAX) 70 MG tablet TAKE 1 TABLET BY MOUTH ONCE WEEKLY 30 MINUTES BEFORE FIRST FOOD, MEDICINE, OR WATER OF THE DAY     buPROPion (WELLBUTRIN) 75 MG tablet Take 1 tablet (75 mg total) by mouth in the morning. 90 tablet 1   Calcium Carb-Cholecalciferol 600-10 MG-MCG TABS Take 1 tablet by mouth daily.     divalproex (DEPAKOTE ER) 500 MG 24 hr tablet Take 1 tablet (500 mg total) by mouth daily with supper. 30 tablet 1   hydrOXYzine (ATARAX) 25 MG tablet Take 1-2 tablets (25-50 mg total) by mouth at bedtime as needed. For sleep, anxiety 60 tablet 1   lamoTRIgine (LAMICTAL) 100 MG tablet Take 0.5 tablets (50 mg total) by mouth daily for 15 days. Tapering off , patient has supplies 8 tablet    Multiple Vitamin (MULTIVITAMIN) tablet Take 1 tablet by mouth daily.     QUEtiapine (SEROQUEL XR) 300 MG 24 hr tablet TAKE 1 TABLET(300 MG) BY MOUTH AT BEDTIME 90 tablet 0   QUEtiapine (SEROQUEL XR) 50 MG TB24 24 hr tablet TAKE 1 TABLET BY MOUTH AT BEDTIME. TAKE ALONG WITH 300 MG AT BEDTIME 90 tablet 0   simvastatin (ZOCOR) 40 MG tablet Take 40 mg by mouth daily.     valACYclovir (VALTREX) 500 MG tablet 1 tablet Orally Twice a day as needed for outbreak     No current facility-administered medications for this visit.     Musculoskeletal: Strength & Muscle Tone: within normal limits Gait & Station:  normal Patient leans: N/A  Psychiatric Specialty Exam: Review of Systems  Psychiatric/Behavioral:  The patient is nervous/anxious.   All other systems reviewed and are negative.   Blood pressure 135/84, pulse 74, temperature (!) 97.2 F (36.2 C), temperature source Skin, height 5\' 5"  (1.651 m), weight 154 lb 9.6 oz (70.1 kg).Body mass index is 25.73 kg/m.  General Appearance: Casual  Eye Contact:  Fair  Speech:  Clear and Coherent  Volume:  Normal  Mood:  Anxious improving  Affect:  Appropriate  Thought Process:  Goal Directed and Descriptions of Associations: Intact  Orientation:  Full (Time, Place, and Person)  Thought Content: WDL   Suicidal Thoughts:  No  Homicidal Thoughts:  No  Memory:  Immediate;  Fair Recent;   Fair Remote;   Fair  Judgement:  Fair  Insight:  Fair  Psychomotor Activity:  Normal  Concentration:  Concentration: Fair and Attention Span: Fair  Recall:  [employer] of Knowledge: Fair  Language: Fair  Akathisia:  No  Handed:  Right  AIMS (if indicated): done  Assets:  Communication Skills Desire for Improvement Housing Social Support  ADL's:  Intact  Cognition: WNL  Sleep:  Fair   Screenings: Midwife Visit from 02/01/2023 in [facility] Office Visit from 01/10/2023 in [facility] Office Visit from 12/22/2022 in [facility] Office Visit from 10/26/2022 in [facility] Office Visit from 07/25/2022 in [facility]  AIMS Total Score 0 0 0 0 0      GAD-7    Flowsheet Row Office Visit from 02/01/2023 in [facility] Office Visit from 01/10/2023 in [facility] Office Visit from 12/22/2022 in [facility] Office Visit from  10/26/2022 in [facility] Office Visit from 07/25/2022 in [facility]  Total GAD-7 Score 7 13 20 9 8       PHQ2-9    Flowsheet Row Office Visit from 02/01/2023 in [facility] Office Visit from 01/10/2023 in [facility] Office Visit from 12/22/2022 in [facility] Office Visit from 10/26/2022 in [facility] Office Visit from 07/25/2022 in [facility]  PHQ-2 Total Score 2 1 0 2 2  PHQ-9 Total Score 10 8 9 10 9       Flowsheet Row Office Visit from 01/10/2023 in [facility] Office Visit from 12/22/2022 in [facility] Office Visit from 10/26/2022 in [facility]  C-SSRS RISK CATEGORY No Risk No Risk No Risk        Assessment and Plan: CORRIE REDER is a 62 year old Caucasian female, married, on disability, lives in [place], has a history of bipolar disorder type II, GAD, multiple medical problems including obstructive sleep apnea on CPAP was evaluated in office today.  Patient is currently improving, will benefit from the following plan.  Plan Bipolar disorder type II, hypomanic with anxious distress in remission Seroquel extended release 350 mg p.o. nightly Continue Depakote ER 500 mg p.o. daily Taper of lamotrigine, reduce lamotrigine to 50 mg p.o. daily for 2 weeks and then reduce lamotrigine 25 mg p.o. daily for another 2 weeks and stop taking it. Patient provided education about the risk of Stevens-Johnson syndrome and increased level of lamotrigine when co administered with Depakote. Continue Wellbutrin 75 mg p.o. daily in the morning  GAD-improving Hydroxyzine 25-50 mg p.o. nightly as  needed for sleep Depakote ER 500 mg p.o. daily with supper Continue CBT with Dr..Nicola at week for next.  Insomnia-improving Continue CPAP for OSA Continue sleep hygiene techniques  High risk medication use-reviewed and discussed labs-01/17/2023-hepatic function-within normal limits, platelet count normal, sodium normal, lamotrigine level-12.9-therapeutic, valproic acid level-69-therapeutic.  Provided counseling about lifestyle modification, exercise.  Follow-up in clinic in 3 to 4 weeks or sooner if needed.    Collaboration of Care: Collaboration of Care: Other patient encouraged to continue psychotherapy sessions.  Patient/Guardian was  advised Release of Information must be obtained prior to any record release in order to collaborate their care with an outside provider. Patient/Guardian was advised if they have not already done so to contact the registration department to sign all necessary forms in order for [place] to release information regarding their care.   Consent: Patient/Guardian gives verbal consent for treatment and assignment of benefits for services provided during this visit. Patient/Guardian expressed understanding and agreed to proceed.   This note was generated in part or whole with voice recognition software. Voice recognition is usually quite accurate but there are transcription errors that can and very often do occur. I apologize for any typographical errors that were not detected and corrected.    Jomarie Longs, MD 02/02/2023, 6:05 PM

## 2023-02-03 DIAGNOSIS — K080 Exfoliation of teeth due to systemic causes: Secondary | ICD-10-CM | POA: Diagnosis not present

## 2023-02-06 ENCOUNTER — Ambulatory Visit: Payer: Medicare Other | Admitting: Psychiatry

## 2023-02-07 DIAGNOSIS — F3181 Bipolar II disorder: Secondary | ICD-10-CM | POA: Diagnosis not present

## 2023-02-15 ENCOUNTER — Ambulatory Visit
Admission: RE | Admit: 2023-02-15 | Discharge: 2023-02-15 | Disposition: A | Discharge status: Home / Self Care | Payer: Medicare Other | Source: Ambulatory Visit | Attending: Internal Medicine | Admitting: Internal Medicine

## 2023-02-15 DIAGNOSIS — Z1231 Encounter for screening mammogram for malignant neoplasm of breast: Secondary | ICD-10-CM | POA: Insufficient documentation

## 2023-02-15 DIAGNOSIS — M810 Age-related osteoporosis without current pathological fracture: Secondary | ICD-10-CM | POA: Diagnosis not present

## 2023-03-07 DIAGNOSIS — F3181 Bipolar II disorder: Secondary | ICD-10-CM | POA: Diagnosis not present

## 2023-03-08 ENCOUNTER — Other Ambulatory Visit: Payer: Self-pay | Admitting: Psychiatry

## 2023-03-08 DIAGNOSIS — F3181 Bipolar II disorder: Secondary | ICD-10-CM

## 2023-03-13 ENCOUNTER — Encounter: Payer: Self-pay | Admitting: Psychiatry

## 2023-03-13 ENCOUNTER — Ambulatory Visit: Payer: Medicare Other | Admitting: Psychiatry

## 2023-03-13 ENCOUNTER — Other Ambulatory Visit
Admission: RE | Admit: 2023-03-13 | Discharge: 2023-03-13 | Disposition: A | Discharge status: Home / Self Care | Payer: Medicare Other | Source: Ambulatory Visit | Attending: Psychiatry | Admitting: Psychiatry

## 2023-03-13 VITALS — BP 136/85 | HR 61 | Temp 97.1°F | Ht 65.0 in | Wt 150.0 lb

## 2023-03-13 DIAGNOSIS — G4733 Obstructive sleep apnea (adult) (pediatric): Secondary | ICD-10-CM | POA: Diagnosis not present

## 2023-03-13 DIAGNOSIS — F3181 Bipolar II disorder: Secondary | ICD-10-CM | POA: Diagnosis not present

## 2023-03-13 DIAGNOSIS — F411 Generalized anxiety disorder: Secondary | ICD-10-CM

## 2023-03-13 DIAGNOSIS — R5383 Other fatigue: Secondary | ICD-10-CM | POA: Insufficient documentation

## 2023-03-13 LAB — VITAMIN B12: Vitamin B-12: 588 pg/mL (ref 180–914)

## 2023-03-13 MED ORDER — MODAFINIL 100 MG PO TABS: 100.0000 mg | ORAL_TABLET | Freq: Every morning | ORAL | 0 refills | Status: DC

## 2023-03-13 NOTE — Progress Notes (Signed)
BH MD OP Progress Note  03/13/2023 5:20 PM [name]  MRN:  [id number]  Chief Complaint:  Chief Complaint  Patient presents with   Follow-up   Fatigue   Depression   Medication Refill   HPI: [name] is a 63 year old Caucasian female, on disability, married, currently lives in [place], has a history of bipolar disorder type II, GAD, primary insomnia, hyperlipidemia, osteoporosis, obstructive sleep apnea on CPAP, history of cataract surgery was evaluated in office today.  Patient today reports she is currently struggling with feeling down, lack of motivation, excessive sleepiness during the day.  She reports she has been using her CPAP daily however in spite of that she does not feel good when she wakes up in the morning.  She struggles with a lot of fatigue which may have worsened in the past several weeks.  She reports due to the fatigue she feels down since she is unable to do anything during the day.  As the day progresses she feels better some days.  She reports her husband has been supportive.  She continues to follow-up with her therapist and reports therapy sessions are beneficial.  Patient denies any significant anxiety symptoms at this time.  Currently compliant on the medications like Depakote, Seroquel, Wellbutrin, hydroxyzine.  Denies any other side effects except for the fatigue unknown if medication induced.  Patient denies any suicidality, homicidality or perceptual disturbances.  Patient denies any other concerns today.  Visit Diagnosis:    ICD-10-CM   1. Bipolar 2 disorder, major depressive episode (HCC)  F31.81    moderate    2. GAD (generalized anxiety disorder)  F41.1     3. OSA on CPAP  G47.33 modafinil (PROVIGIL) 100 MG tablet    4. Fatigue, unspecified type  R53.83 modafinil (PROVIGIL) 100 MG tablet    Vitamin B12      Past Psychiatric History: I have reviewed past psychiatric history from progress note on 09/01/2021.  Past trials  of lithium-side effect, Ativan-side effect, Depakote-gained weight, Trileptal, Latuda-akathisia, Celexa, Effexor-made her manic, trazodone, Tranxene, Zoloft, Prozac, BuSpar-not helpful, lamotrigine-restless, Seroquel, Abilify, TCA.  Past Medical History:  Past Medical History:  Diagnosis Date   Bipolar 1 disorder (HCC)    Hyperlipidemia    OSA (obstructive sleep apnea)    CPAP dependent   Osteoporosis    PPD positive     Past Surgical History:  Procedure Laterality Date   ABDOMINAL HYSTERECTOMY     CATARACT EXTRACTION, BILATERAL     due to seroquel   ROBOTIC ASSISTED LAPAROSCOPIC HYSTERECTOMY AND SALPINGECTOMY      Family Psychiatric History: Reviewed family psychiatric history from progress note on 09/01/2021.  Family History:  Family History  Problem Relation Age of Onset   Personality disorder Mother    Alcohol abuse Maternal Uncle    Bipolar disorder Cousin    Schizophrenia Other    Breast cancer Neg Hx     Social History: I have reviewed social history from progress note on 09/01/2021. Social History   Socioeconomic History   Marital status: Married    Spouse name: eric   Number of children: 2   Years of education: Not on file   Highest education level: Bachelor's degree (e.g., BA, AB, BS)  Occupational History    Comment: unemployed  Tobacco Use   Smoking status: Never   Smokeless tobacco: Never  Vaping Use   Vaping Use: Never used  Substance and Sexual Activity   Alcohol use: No  Drug use: No   Sexual activity: Yes  Other Topics Concern   Not on file  Social History Narrative   Not on file   Social Determinants of Health   Financial Resource Strain: Not on file  Food Insecurity: Not on file  Transportation Needs: Not on file  Physical Activity: Not on file  Stress: Not on file  Social Connections: Not on file    Allergies:  Allergies  Allergen Reactions   Tyloxapol Rash   Prilosec [Omeprazole]     Interacts with psych meds   Tylox  [Oxycodone-Acetaminophen] Rash    Metabolic Disorder Labs: No results found for: "HGBA1C", "MPG" Lab Results  Component Value Date   PROLACTIN 5.1 09/01/2021   No results found for: "CHOL", "TRIG", "HDL", "CHOLHDL", "VLDL", "LDLCALC" Lab Results  Component Value Date   TSH 0.955 09/01/2021    Therapeutic Level Labs: No results found for: "LITHIUM" Lab Results  Component Value Date   VALPROATE 69 01/17/2023   No results found for: "CBMZ"  Current Medications: Current Outpatient Medications  Medication Sig Dispense Refill   alendronate (FOSAMAX) 70 MG tablet TAKE 1 TABLET BY MOUTH ONCE WEEKLY 30 MINUTES BEFORE FIRST FOOD, MEDICINE, OR WATER OF THE DAY     buPROPion (WELLBUTRIN) 75 MG tablet Take 1 tablet (75 mg total) by mouth in the morning. 90 tablet 1   divalproex (DEPAKOTE ER) 500 MG 24 hr tablet TAKE 1 TABLET(500 MG) BY MOUTH DAILY WITH SUPPER 90 tablet 1   hydrOXYzine (ATARAX) 25 MG tablet Take 1-2 tablets (25-50 mg total) by mouth at bedtime as needed. For sleep, anxiety 60 tablet 1   modafinil (PROVIGIL) 100 MG tablet Take 1 tablet (100 mg total) by mouth in the morning. 30 tablet 0   Multiple Vitamin (MULTIVITAMIN) tablet Take 1 tablet by mouth daily.     QUEtiapine (SEROQUEL XR) 300 MG 24 hr tablet TAKE 1 TABLET(300 MG) BY MOUTH AT BEDTIME 90 tablet 0   QUEtiapine (SEROQUEL XR) 50 MG TB24 24 hr tablet TAKE 1 TABLET BY MOUTH AT BEDTIME. TAKE ALONG WITH 300 MG AT BEDTIME 90 tablet 0   simvastatin (ZOCOR) 40 MG tablet Take 40 mg by mouth daily.     valACYclovir (VALTREX) 500 MG tablet 1 tablet Orally Twice a day as needed for outbreak     No current facility-administered medications for this visit.     Musculoskeletal: Strength & Muscle Tone: within normal limits Gait & Station: normal Patient leans: N/A  Psychiatric Specialty Exam: Review of Systems  Constitutional:  Positive for fatigue.  Psychiatric/Behavioral:  Positive for dysphoric mood and sleep  disturbance.     Blood pressure 136/85, pulse 61, temperature (!) 97.1 F (36.2 C), temperature source Skin, height 5\' 5"  (1.651 m), weight 150 lb (68 kg).Body mass index is 24.96 kg/m.  General Appearance: Casual  Eye Contact:  Fair  Speech:  Clear and Coherent  Volume:  Normal  Mood:  Dysphoric  Affect:  Congruent  Thought Process:  Goal Directed and Descriptions of Associations: Intact  Orientation:  Full (Time, Place, and Person)  Thought Content: Logical   Suicidal Thoughts:  No  Homicidal Thoughts:  No  Memory:  Immediate;   Fair Recent;   Fair Remote;   Fair  Judgement:  Fair  Insight:  Fair  Psychomotor Activity:  Normal  Concentration:  Concentration: Fair and Attention Span: Fair  Recall:  [employer] of Knowledge: Fair  Language: Fair  Akathisia:  No  Handed:  Right  AIMS (if indicated): not done  Assets:  Communication Skills Desire for Improvement Housing Intimacy Social Support  ADL's:  Intact  Cognition: WNL  Sleep:   Excessive   Screenings: Geneticist, molecular Office Visit from 02/01/2023 in [facility] Office Visit from 01/10/2023 in [facility] Office Visit from 12/22/2022 in [facility] Office Visit from 10/26/2022 in [facility] Office Visit from 07/25/2022 in [facility]  AIMS Total Score 0 0 0 0 0      GAD-7    Flowsheet Row Office Visit from 03/13/2023 in [facility] Office Visit from 02/01/2023 in [facility] Office Visit from 01/10/2023 in [facility] Office Visit from 12/22/2022 in [facility] Office Visit from 10/26/2022 in [facility]  Total GAD-7  Score 16 7 13 20 9       PHQ2-9    Flowsheet Row Office Visit from 03/13/2023 in [facility] Office Visit from 02/01/2023 in [facility] Office Visit from 01/10/2023 in [facility] Office Visit from 12/22/2022 in [facility] Office Visit from 10/26/2022 in [facility]  PHQ-2 Total Score 5 2 1  0 2  PHQ-9 Total Score 21 10 8 9 10       Flowsheet Row Office Visit from 03/13/2023 in [facility] Office Visit from 01/10/2023 in [facility] Office Visit from 12/22/2022 in [facility]  C-SSRS RISK CATEGORY No Risk No Risk No Risk        Assessment and Plan: DEMEKIA REZEK is a 62 year old Caucasian female, married, disability, lives in [place], has a history of bipolar disorder type II, GAD, multiple medical problems including obstructive sleep apnea on CPAP was evaluated in the office today.  Patient currently struggling with low mood, excessive sleepiness, fatigue although compliant on CPAP, unknown if medication induced versus other causes, will benefit from the following plan.  Plan Bipolar disorder type II, depressed-unstable Will consider changing Seroquel extended release to immediate release in the future unknown if Seroquel extended release causing excessive sleepiness during the day. For now will continue Seroquel extended release 350 mg p.o. nightly Continue Depakote ER 500 mg p.o. daily Discontinue lamotrigine-tapered off. Continue Wellbutrin 75 mg p.o. daily in the morning Start modafinil 100 mg p.o. daily in the morning. Reviewed [place] PMP AWARxE   GAD-improved Hydroxyzine 25-50 mg p.o. nightly as needed for sleep Depakote ER 500 mg p.o. daily with  supper Continue CBT with Dr.Nicola,  Obstructive sleep apnea-unstable Continue CPAP for OSA Continue sleep hygiene techniques Start modafinil 100 mg p.o. daily Provided medication education.  Fatigue-unstable Discussed and reviewed TSH-dated 12/21/2022-2.15-within normal limits. Will order vitamin B12-patient to go to [facility] lab Will start modafinil 100 mg p.o. daily.  Follow-up in clinic in 1 week or sooner if needed.   Patient/Guardian was advised Release of Information must be obtained prior to any record release in order to collaborate their care with an outside provider. Patient/Guardian was advised if they have not already done so to contact the registration department to sign all necessary forms in order for [place] to release information regarding their care.  Consent: Patient/Guardian gives verbal consent for treatment and assignment of benefits for services provided during this visit. Patient/Guardian expressed understanding and agreed to proceed.   This note was generated in part or whole with voice recognition software. Voice recognition is usually quite accurate but there are transcription errors that can and very often do occur. I apologize for any typographical errors that were not detected and corrected.    Jomarie Longs, MD 03/13/2023, 5:20 PM

## 2023-03-13 NOTE — Patient Instructions (Signed)
Modafinil Tablets ?What is this medication? ?MODAFINIL (moe DAF i nil) treats sleep disorders, such as narcolepsy, obstructive sleep apnea, and shift work disorder. It works by promoting wakefulness. It belongs to a group of medications called stimulants. ?This medicine may be used for other purposes; ask your health care provider or pharmacist if you have questions. ?COMMON BRAND NAME(S): Provigil ?What should I tell my care team before I take this medication? ?They need to know if you have any of these conditions: ?Kidney disease ?Liver disease ?Mental health conditions ?An unusual or allergic reaction to modafinil, other medications, foods, dyes, or preservatives ?Pregnant or trying to get pregnant ?Breast-feeding ?How should I use this medication? ?Take this medication by mouth with water. Take it as directed on the prescription label at the same time every day. You can take it with or without food. If it upsets your stomach, take it with food. Keep taking it unless your care team tells you to stop. ?A special MedGuide will be given to you by the pharmacist with each prescription and refill. Be sure to read this information carefully each time. ?Talk to your care team about the use of this medication in children. Special care may be needed. ?Overdosage: If you think you have taken too much of this medicine contact a poison control center or emergency room at once. ?NOTE: This medicine is only for you. Do not share this medicine with others. ?What if I miss a dose? ?If you miss a dose, take it as soon as you can. If it is almost time for your next dose, take only that dose. Do not take double or extra doses. ?What may interact with this medication? ?Do not take this medication with any of the following: ?Amphetamine or dextroamphetamine ?Dexmethylphenidate or methylphenidate ?MAOIs, such as Marplan, Nardil, and Parnate ?Pemoline ?Procarbazine ?This medication may also interact with the following: ?Antifungal  medications, such as itraconazole or ketoconazole ?Barbiturates, such as phenobarbital ?Carbamazepine ?Cyclosporine ?Diazepam ?Estrogen or progestin hormones ?Medications for mental health conditions ?Phenytoin ?Propranolol ?Triazolam ?Warfarin ?This list may not describe all possible interactions. Give your health care provider a list of all the medicines, herbs, non-prescription drugs, or dietary supplements you use. Also tell them if you smoke, drink alcohol, or use illegal drugs. Some items may interact with your medicine. ?What should I watch for while using this medication? ?Visit your care team for regular checks on your progress. It may be some time before you see the benefit from this medication. ?This medication may affect your coordination, reaction time, or judgment. It may also hide signs that you are tired. This medication will not eliminate your abnormal tendency to fall asleep and is not a replacement for sleep. Do not drive or operate machinery until you know how this medication affects you. Sit up or stand slowly to reduce the risk of dizzy or fainting spells. Drinking alcohol with this medication can increase the risk of these side effects. ?This medication may cause serious skin reactions. They can happen weeks to months after starting the medication. Contact your care team right away if you notice fevers or flu-like symptoms with a rash. The rash may be red or purple and then turn into blisters or peeling of the skin. You may also notice a red rash with swelling of the face, lips, or lymph nodes in your neck or under your arms. ?Estrogen and progestin hormones may not work as well while you are taking this medication. If you are using these hormones   for contraception, talk to your care team about using a second type of contraception. A barrier contraceptive, such as a condom or diaphragm, is recommended. ?It is unknown if the effects of this medication will be increased by the use of caffeine.  Caffeine is found in many foods, beverages, and medications. Ask your care team if you should limit or change your intake of caffeine-containing products while on this medication. ?What side effects may I notice from receiving this medication? ?Side effects that you should report to your care team as soon as possible: ?Allergic reactions or angioedema--skin rash, itching or hives, swelling of the face, eyes, lips, tongue, arms, or legs, trouble swallowing or breathing ?Increase in blood pressure ?Mood and behavior changes--anxiety, nervousness, confusion, hallucinations, irritability, hostility, thoughts of suicide or self-harm, worsening mood, feelings of depression ?Rash, fever, and swollen lymph nodes ?Redness, blistering, peeling, or loosening of the skin, including inside the mouth ?Side effects that usually do not require medical attention (report to your care team if they continue or are bothersome): ?Anxiety, nervousness ?Dizziness ?Headache ?Nausea ?Trouble sleeping ?This list may not describe all possible side effects. Call your doctor for medical advice about side effects. You may report side effects to FDA at 1-800-FDA-1088. ?Where should I keep my medication? ?Keep out of the reach of children and pets. This medication can be abused. Keep it in a safe place to protect it from theft. Do not share it with anyone. It is only for you. Selling or giving away this medication is dangerous and against the law. ?Store at room temperature between 20 and 25 degrees C (68 and 77 degrees F). Get rid of any unused medication after the expiration date. ?This medication may cause harm and death if it is taken by other adults, children, or pets. It is important to get rid of the medication as soon as you no longer need it or it is expired. You can do this in two ways: ?Take the medication to a medication take-back program. Check with your pharmacy or law enforcement to find a location. ?If you cannot return the  medication, check the label or package insert to see if the medication should be thrown out in the garbage or flushed down the toilet. If you are not sure, ask your care team. If it is safe to put it in the trash, take the medication out of the container. Mix the medication with cat litter, dirt, coffee grounds, or other unwanted substance. Seal the mixture in a bag or container. Put it in the trash. ?NOTE: This sheet is a summary. It may not cover all possible information. If you have questions about this medicine, talk to your doctor, pharmacist, or health care provider. ?? 2023 Elsevier/Gold Standard (2021-11-18 00:00:00) ? ?

## 2023-03-14 ENCOUNTER — Telehealth: Payer: Self-pay

## 2023-03-14 NOTE — Telephone Encounter (Signed)
pharmacy was faxed notification

## 2023-03-14 NOTE — Telephone Encounter (Signed)
Pt notified left message  

## 2023-03-14 NOTE — Telephone Encounter (Signed)
received fax that the prior auth was approved until 03-12-24

## 2023-03-14 NOTE — Telephone Encounter (Signed)
went online and submitted the prior auth - pending ?

## 2023-03-14 NOTE — Telephone Encounter (Signed)
pt called left message that she needed a prior auth for the modafinil 100mg 

## 2023-03-21 ENCOUNTER — Encounter: Payer: Self-pay | Admitting: Psychiatry

## 2023-03-21 ENCOUNTER — Telehealth (INDEPENDENT_AMBULATORY_CARE_PROVIDER_SITE_OTHER): Payer: Medicare Other | Admitting: Psychiatry

## 2023-03-21 DIAGNOSIS — F3181 Bipolar II disorder: Secondary | ICD-10-CM

## 2023-03-21 DIAGNOSIS — F411 Generalized anxiety disorder: Secondary | ICD-10-CM | POA: Diagnosis not present

## 2023-03-21 DIAGNOSIS — G4733 Obstructive sleep apnea (adult) (pediatric): Secondary | ICD-10-CM

## 2023-03-21 DIAGNOSIS — R5383 Other fatigue: Secondary | ICD-10-CM

## 2023-03-21 MED ORDER — QUETIAPINE FUMARATE ER 300 MG PO TB24: ORAL_TABLET | ORAL | 0 refills | Status: DC

## 2023-03-21 MED ORDER — MODAFINIL 100 MG PO TABS: 100.0000 mg | ORAL_TABLET | Freq: Every morning | ORAL | 1 refills | Status: DC

## 2023-03-21 MED ORDER — QUETIAPINE FUMARATE ER 50 MG PO TB24: ORAL_TABLET | ORAL | 0 refills | Status: DC

## 2023-03-21 NOTE — Progress Notes (Signed)
Virtual Visit via Video Note  I connected with [name] on 03/21/23 at  3:00 PM EDT by a video enabled telemedicine application and verified that I am speaking with the correct person using two identifiers.  Location Provider Location : Remote office Patient Location : Home  Participants: Patient , Provider    I discussed the limitations of evaluation and management by telemedicine and the availability of in person appointments. The patient expressed understanding and agreed to proceed.   I discussed the assessment and treatment plan with the patient. The patient was provided an opportunity to ask questions and all were answered. The patient agreed with the plan and demonstrated an understanding of the instructions.   The patient was advised to call back or seek an in-person evaluation if the symptoms worsen or if the condition fails to improve as anticipated.    BH MD OP Progress Note  03/22/2023 4:34 PM [name]  MRN:  [id number]  Chief Complaint:  Chief Complaint  Patient presents with   Medication Refill   Follow-up   Depression   Anxiety   HPI: [name] is a 62 year old Caucasian female, disability, married, currently lives in [place], has a history of bipolar disorder type II, GAD, primary insomnia, hyperlipidemia, osteoporosis, obstructive sleep apnea on CPAP, history of cataract surgery was evaluated by telemedicine today.  Patient today reports she is currently tolerating the modafinil well.  She reports she has noticed an improvement in her energy level during the day.  She has been able to do more than she has been able to in the past.  Patient reports she has not noticed any side effects.  She has been monitoring her blood pressure and heart rate.  She also reports sleep is overall okay.  She continues to be compliant on her CPAP.  Patient reports since she is motivated to do more activities and chores around the house she does  not feel as depressed as she used to before.  She is able to cope with her anxiety and has not noticed any worsening anxiety on the modafinil.  Patient is compliant on her medications like Seroquel, Depakote, Wellbutrin.  Patient denies any side effects.  Reports she ran out of her Seroquel 50 mg although she has been taking her medications as prescribed.  She will reach out to pharmacy to figure this out.  Patient denies any suicidality, homicidality or perceptual disturbances.  Patient denies any other concerns today.    Visit Diagnosis:    ICD-10-CM   1. Moderate bipolar II disorder, depressed, in partial remission, with anxious distress (HCC)  F31.81 QUEtiapine (SEROQUEL XR) 300 MG 24 hr tablet    2. GAD (generalized anxiety disorder)  F41.1 QUEtiapine (SEROQUEL XR) 50 MG TB24 24 hr tablet    3. OSA on CPAP  G47.33 modafinil (PROVIGIL) 100 MG tablet    4. Fatigue, unspecified type  R53.83       Past Psychiatric History: I have reviewed past psychiatric history from progress note on 09/01/2021.  Past trials of lithium-side effect, Ativan-side effect, Depakote-gained weight, Trileptal, Latuda-akathisia, Celexa, Effexor-made her manic, trazodone,tranxene, Zoloft, Prozac, BuSpar-not helpful, lamotrigine-restless, Seroquel, Abilify, TCA.  Past Medical History:  Past Medical History:  Diagnosis Date   Bipolar 1 disorder (HCC)    Hyperlipidemia    OSA (obstructive sleep apnea)    CPAP dependent   Osteoporosis    PPD positive     Past Surgical History:  Procedure Laterality Date   ABDOMINAL  HYSTERECTOMY     CATARACT EXTRACTION, BILATERAL     due to seroquel   ROBOTIC ASSISTED LAPAROSCOPIC HYSTERECTOMY AND SALPINGECTOMY      Family Psychiatric History: I have reviewed family psychiatric history from progress note on 09/01/2021.  Family History:  Family History  Problem Relation Age of Onset   Personality disorder Mother    Alcohol abuse Maternal Uncle    Bipolar disorder  Cousin    Schizophrenia Other    Breast cancer Neg Hx     Social History: I have reviewed social history from progress note on 09/01/2021. Social History   Socioeconomic History   Marital status: Married    Spouse name: eric   Number of children: 2   Years of education: Not on file   Highest education level: Bachelor's degree (e.g., BA, AB, BS)  Occupational History    Comment: unemployed  Tobacco Use   Smoking status: Never   Smokeless tobacco: Never  Vaping Use   Vaping Use: Never used  Substance and Sexual Activity   Alcohol use: No   Drug use: No   Sexual activity: Yes  Other Topics Concern   Not on file  Social History Narrative   Not on file   Social Determinants of Health   Financial Resource Strain: Not on file  Food Insecurity: Not on file  Transportation Needs: Not on file  Physical Activity: Not on file  Stress: Not on file  Social Connections: Not on file    Allergies:  Allergies  Allergen Reactions   Tyloxapol Rash   Prilosec [Omeprazole]     Interacts with psych meds   Tylox [Oxycodone-Acetaminophen] Rash    Metabolic Disorder Labs: No results found for: "HGBA1C", "MPG" Lab Results  Component Value Date   PROLACTIN 5.1 09/01/2021   No results found for: "CHOL", "TRIG", "HDL", "CHOLHDL", "VLDL", "LDLCALC" Lab Results  Component Value Date   TSH 0.955 09/01/2021    Therapeutic Level Labs: No results found for: "LITHIUM" Lab Results  Component Value Date   VALPROATE 69 01/17/2023   No results found for: "CBMZ"  Current Medications: Current Outpatient Medications  Medication Sig Dispense Refill   alendronate (FOSAMAX) 70 MG tablet TAKE 1 TABLET BY MOUTH ONCE WEEKLY 30 MINUTES BEFORE FIRST FOOD, MEDICINE, OR WATER OF THE DAY     buPROPion (WELLBUTRIN) 75 MG tablet Take 1 tablet (75 mg total) by mouth in the morning. 90 tablet 1   divalproex (DEPAKOTE ER) 500 MG 24 hr tablet TAKE 1 TABLET(500 MG) BY MOUTH DAILY WITH SUPPER 90 tablet 1    hydrOXYzine (ATARAX) 25 MG tablet Take 1-2 tablets (25-50 mg total) by mouth at bedtime as needed. For sleep, anxiety 60 tablet 1   [START ON 04/12/2023] modafinil (PROVIGIL) 100 MG tablet Take 1 tablet (100 mg total) by mouth in the morning. 30 tablet 1   Multiple Vitamin (MULTIVITAMIN) tablet Take 1 tablet by mouth daily.     QUEtiapine (SEROQUEL XR) 300 MG 24 hr tablet TAKE 1 TABLET(300 MG) BY MOUTH AT BEDTIME 90 tablet 0   QUEtiapine (SEROQUEL XR) 50 MG TB24 24 hr tablet TAKE 1 TABLET BY MOUTH AT BEDTIME. TAKE ALONG WITH 300 MG AT BEDTIME 90 tablet 0   simvastatin (ZOCOR) 40 MG tablet Take 40 mg by mouth daily.     valACYclovir (VALTREX) 500 MG tablet 1 tablet Orally Twice a day as needed for outbreak     No current facility-administered medications for this visit.  Musculoskeletal: Strength & Muscle Tone:  UTA Gait & Station:  Seated Patient leans: N/A  Psychiatric Specialty Exam: Review of Systems  Constitutional:  Positive for fatigue (Improving).  Psychiatric/Behavioral:  Positive for sleep disturbance.     There were no vitals taken for this visit.There is no height or weight on file to calculate BMI.  General Appearance: Casual  Eye Contact:  Fair  Speech:  Clear and Coherent  Volume:  Normal  Mood:  Euthymic  Affect:  Congruent  Thought Process:  Goal Directed and Descriptions of Associations: Intact  Orientation:  Full (Time, Place, and Person)  Thought Content: Logical   Suicidal Thoughts:  No  Homicidal Thoughts:  No  Memory:  Immediate;   Fair Recent;   Fair Remote;   Fair  Judgement:  Fair  Insight:  Fair  Psychomotor Activity:  Normal  Concentration:  Concentration: Fair and Attention Span: Fair  Recall:  [employer] of Knowledge: Fair  Language: Fair  Akathisia:  No  Handed:  Right  AIMS (if indicated): not done  Assets:  Communication Skills Desire for Improvement Housing Social Support  ADL's:  Intact  Cognition: WNL  Sleep:   Improving    Screenings: Geneticist, molecular Office Visit from 02/01/2023 in [facility] Office Visit from 01/10/2023 in [facility] Office Visit from 12/22/2022 in [facility] Office Visit from 10/26/2022 in [facility] Office Visit from 07/25/2022 in [facility]  AIMS Total Score 0 0 0 0 0      GAD-7    Flowsheet Row Office Visit from 03/13/2023 in [facility] Office Visit from 02/01/2023 in [facility] Office Visit from 01/10/2023 in [facility] Office Visit from 12/22/2022 in [facility] Office Visit from 10/26/2022 in [facility]  Total GAD-7 Score 16 7 13 20 9       PHQ2-9    Flowsheet Row Office Visit from 03/13/2023 in [facility] Office Visit from 02/01/2023 in [facility] Office Visit from 01/10/2023 in [facility] Office Visit from 12/22/2022 in [facility] Office Visit from 10/26/2022 in [facility]  PHQ-2 Total Score 5 2 1  0 2  PHQ-9 Total Score 21 10 8 9 10       Flowsheet Row Video Visit from 03/21/2023 in [facility] Office Visit from 03/13/2023 in [facility] Office Visit from 01/10/2023 in [facility]  C-SSRS RISK CATEGORY No Risk No Risk No Risk        Assessment and Plan: [name] is a 62 year old Caucasian female, married, on disability, lives in [place],  has a history of bipolar disorder type II, GAD, multiple medical problems including obstructive sleep apnea on CPAP was evaluated by telemedicine today.  Patient is currently improving on the current medication regimen, plan as noted below.  Plan Bipolar disorder type II depressed-in partial remission Continue Seroquel extended release 350 mg p.o. nightly Continue Depakote ER 500 mg p.o. daily Wellbutrin 75 mg p.o. daily .  GAD-improving Hydroxyzine 25-50 mg p.o. nightly as needed for sleep Depakote ER 500 mg p.o. daily with supper Continue  CBT with Dr. Lang Snow.  Obstructive sleep apnea-improving Continue CPAP for OSA Modafinil 100 mg p.o. daily Reviewed [place] PMP AWARxE  Fatigue-improving Reviewed vitamin B12 level-within normal limits-dated 03/13/2023. Continue modafinil as noted above.  Follow-up in clinic in 6 to 8 weeks or sooner if needed.   Collaboration of Care: Collaboration of Care: Referral or follow-up with counselor/therapist AEB patient to continue CBT.  Patient/Guardian was advised Release of Information must be obtained prior to any record release in order to collaborate their care with an outside provider. Patient/Guardian was advised if they have not already done so to contact the registration department to sign all necessary forms in order for [place] to release information regarding their care.   Consent: Patient/Guardian gives verbal consent for treatment and assignment of benefits for services provided during this visit. Patient/Guardian expressed understanding and agreed to proceed.   This note was generated in part or whole with voice recognition software. Voice recognition is usually quite accurate but there are transcription errors that can and very often do occur. I apologize for any typographical errors that were not detected and corrected.     Jomarie Longs, MD 03/22/2023, 4:34 PM

## 2023-04-07 DIAGNOSIS — F3181 Bipolar II disorder: Secondary | ICD-10-CM | POA: Diagnosis not present

## 2023-05-10 ENCOUNTER — Telehealth: Payer: Self-pay | Admitting: Psychiatry

## 2023-05-10 NOTE — Telephone Encounter (Signed)
Patient called requesting an earlier appointment. Patient states she feels "off" and is having trouble concentrating. Patient's next appointment is 7/30 and is on the wait list for any earlier openings.

## 2023-05-12 NOTE — Telephone Encounter (Signed)
Yes , please keep on priority list.

## 2023-05-15 ENCOUNTER — Encounter: Payer: Self-pay | Admitting: Psychiatry

## 2023-05-15 ENCOUNTER — Ambulatory Visit (INDEPENDENT_AMBULATORY_CARE_PROVIDER_SITE_OTHER): Payer: Medicare Other | Admitting: Psychiatry

## 2023-05-15 VITALS — BP 131/81 | HR 64 | Temp 97.6°F | Ht 65.0 in | Wt 152.0 lb

## 2023-05-15 DIAGNOSIS — F3181 Bipolar II disorder: Secondary | ICD-10-CM

## 2023-05-15 DIAGNOSIS — F411 Generalized anxiety disorder: Secondary | ICD-10-CM | POA: Diagnosis not present

## 2023-05-15 DIAGNOSIS — Z79899 Other long term (current) drug therapy: Secondary | ICD-10-CM

## 2023-05-15 DIAGNOSIS — G4733 Obstructive sleep apnea (adult) (pediatric): Secondary | ICD-10-CM | POA: Diagnosis not present

## 2023-05-15 MED ORDER — DIVALPROEX SODIUM ER 250 MG PO TB24: 250.0000 mg | ORAL_TABLET | Freq: Every day | ORAL | 0 refills | Status: DC

## 2023-05-15 NOTE — Progress Notes (Unsigned)
BH MD OP Progress Note  05/15/2023 4:07 PM [name]  MRN:  [id number]  Chief Complaint:  Chief Complaint  Patient presents with   Follow-up   Anxiety   Depression   Medication Refill   HPI: [name] is a 62 year old Caucasian female on disability, married, currently lives in [place], has a history of bipolar disorder type II, GAD, primary insomnia, OSA on CPAP, hyperlipidemia, osteoporosis, history of cataract surgery was evaluated in office today.  Patient today reports since the past few weeks she has noticed depression as getting worse.  Patient reports she feels sad, has trouble with concentration as well as forgetfulness.  She had similar symptoms when she went through another episode of depression couple of months ago.  However she felt better once her depression improved.  She reports her husband also has noticed that she has been more forgetful lately.  Patient reports initially when she was started on armodafinil she felt better.  Patient reports she wakes up in the morning and takes the medication and stays in bed at least until 9 or 9:30 AM.  Reports after that she is able to get out of bed and does not have any significant hypersomnia during the day.  She is not interested in further dosage increase of this medication since she does not want to be on anything to habit-forming.  Patient denies any suicidality, homicidality or perceptual disturbances.  Patient today appeared to be alert, oriented to person place time and situation.  3 word memory immediate 3 out of 3 after 5 minutes 3 out of 3.  Patient was able to do serial threes, attention and focus seem to be good.  Patient denies side effects to her medications.  Reports she is compliant.  She is also compliant on her CPAP.  Patient reports she continues to follow-up with her therapist and is motivated to stay in therapy.  Patient agreeable to dosage increase of Depakote to target her depression  symptoms.  Visit Diagnosis:    ICD-10-CM   1. Bipolar 2 disorder, major depressive episode (HCC)  F31.81 divalproex (DEPAKOTE ER) 250 MG 24 hr tablet    Valproic acid level    Hepatic function panel   moderate    2. GAD (generalized anxiety disorder)  F41.1     3. OSA on CPAP  G47.33     4. High risk medication use  Z79.899 Valproic acid level    Hepatic function panel      Past Psychiatric History: I have reviewed past psychiatric history from progress note on 09/01/2021.  Past trials of lithium-side effect, Ativan-side effect, Depakote-gained weight, Trileptal, Latuda-akathisia, Celexa, Effexor-made her manic, trazodone, Tranxene, Zoloft, Prozac, BuSpar-not helpful, lamotrigine-restless, Seroquel, Abilify, TCA.  Past Medical History:  Past Medical History:  Diagnosis Date   Bipolar 1 disorder (HCC)    Hyperlipidemia    OSA (obstructive sleep apnea)    CPAP dependent   Osteoporosis    PPD positive     Past Surgical History:  Procedure Laterality Date   ABDOMINAL HYSTERECTOMY     CATARACT EXTRACTION, BILATERAL     due to seroquel   ROBOTIC ASSISTED LAPAROSCOPIC HYSTERECTOMY AND SALPINGECTOMY      Family Psychiatric History: I have reviewed family psychiatric history from progress note on 09/01/2021.  Family History:  Family History  Problem Relation Age of Onset   Personality disorder Mother    Alcohol abuse Maternal Uncle    Bipolar disorder Cousin    Schizophrenia Other  Breast cancer Neg Hx     Social History: I have reviewed social history from progress note on 09/01/2021. Social History   Socioeconomic History   Marital status: Married    Spouse name: eric   Number of children: 2   Years of education: Not on file   Highest education level: Bachelor's degree (e.g., BA, AB, BS)  Occupational History    Comment: unemployed  Tobacco Use   Smoking status: Never   Smokeless tobacco: Never  Vaping Use   Vaping status: Never Used  Substance and Sexual  Activity   Alcohol use: No   Drug use: No   Sexual activity: Yes  Other Topics Concern   Not on file  Social History Narrative   Not on file   Social Determinants of Health   Financial Resource Strain: Not on file  Food Insecurity: Not on file  Transportation Needs: Not on file  Physical Activity: Not on file  Stress: Not on file  Social Connections: Unknown (03/07/2022)   Received from [facility]   Social Network    Social Network: Not on file    Allergies:  Allergies  Allergen Reactions   Tyloxapol Rash   Prilosec [Omeprazole]     Interacts with psych meds   Tylox [Oxycodone-Acetaminophen] Rash    Metabolic Disorder Labs: No results found for: "HGBA1C", "MPG" Lab Results  Component Value Date   PROLACTIN 5.1 09/01/2021   No results found for: "CHOL", "TRIG", "HDL", "CHOLHDL", "VLDL", "LDLCALC" Lab Results  Component Value Date   TSH 0.955 09/01/2021    Therapeutic Level Labs: No results found for: "LITHIUM" Lab Results  Component Value Date   VALPROATE 69 01/17/2023   No results found for: "CBMZ"  Current Medications: Current Outpatient Medications  Medication Sig Dispense Refill   alendronate (FOSAMAX) 70 MG tablet TAKE 1 TABLET BY MOUTH ONCE WEEKLY 30 MINUTES BEFORE FIRST FOOD, MEDICINE, OR WATER OF THE DAY     buPROPion (WELLBUTRIN) 75 MG tablet Take 1 tablet (75 mg total) by mouth in the morning. 90 tablet 1   divalproex (DEPAKOTE ER) 250 MG 24 hr tablet Take 1 tablet (250 mg total) by mouth daily with supper. Take along with 500 mg daily 90 tablet 0   divalproex (DEPAKOTE ER) 500 MG 24 hr tablet TAKE 1 TABLET(500 MG) BY MOUTH DAILY WITH SUPPER 90 tablet 1   hydrOXYzine (ATARAX) 25 MG tablet Take 1-2 tablets (25-50 mg total) by mouth at bedtime as needed. For sleep, anxiety 60 tablet 1   modafinil (PROVIGIL) 100 MG tablet Take 1 tablet (100 mg total) by mouth in the morning. 30 tablet 1   Multiple Vitamin (MULTIVITAMIN) tablet Take 1 tablet by mouth  daily.     QUEtiapine (SEROQUEL XR) 300 MG 24 hr tablet TAKE 1 TABLET(300 MG) BY MOUTH AT BEDTIME 90 tablet 0   QUEtiapine (SEROQUEL XR) 50 MG TB24 24 hr tablet TAKE 1 TABLET BY MOUTH AT BEDTIME. TAKE ALONG WITH 300 MG AT BEDTIME 90 tablet 0   simvastatin (ZOCOR) 40 MG tablet Take 40 mg by mouth daily.     valACYclovir (VALTREX) 500 MG tablet 1 tablet Orally Twice a day as needed for outbreak     No current facility-administered medications for this visit.     Musculoskeletal: Strength & Muscle Tone: within normal limits Gait & Station: normal Patient leans: N/A  Psychiatric Specialty Exam: Review of Systems  Psychiatric/Behavioral:  Positive for decreased concentration and dysphoric mood. The patient is nervous/anxious.  Blood pressure 131/81, pulse 64, temperature 97.6 F (36.4 C), temperature source Skin, height 5\' 5"  (1.651 m), weight 152 lb (68.9 kg).Body mass index is 25.29 kg/m.  General Appearance: Fairly Groomed  Eye Contact:  Fair  Speech:  Clear and Coherent  Volume:  Normal  Mood:  Anxious and Depressed  Affect:  Congruent  Thought Process:  Goal Directed and Descriptions of Associations: Intact  Orientation:  Full (Time, Place, and Person)  Thought Content: Logical   Suicidal Thoughts:  No  Homicidal Thoughts:  No  Memory:  Immediate;   Fair Recent;   Fair Remote;   Fair  Judgement:  Intact  Insight:  Fair  Psychomotor Activity:  Normal  Concentration:  Concentration: Fair and Attention Span: Fair  Recall:  [employer] of Knowledge: Fair  Language: Fair  Akathisia:  No  Handed:  Right  AIMS (if indicated): done  Assets:  Communication Skills Desire for Improvement Housing Intimacy Social Support  ADL's:  Intact  Cognition: WNL  Sleep:  Fair   Screenings: Midwife Visit from 05/15/2023 in [facility] Office Visit from 02/01/2023 in [facility]  Office Visit from 01/10/2023 in [facility] Office Visit from 12/22/2022 in [facility] Office Visit from 10/26/2022 in [facility]  AIMS Total Score 0 0 0 0 0      GAD-7    Flowsheet Row Office Visit from 05/15/2023 in [facility] Office Visit from 03/13/2023 in [facility] Office Visit from 02/01/2023 in [facility] Office Visit from 01/10/2023 in [facility] Office Visit from 12/22/2022 in [facility]  Total GAD-7 Score 13 16 7 13 20       PHQ2-9    Flowsheet Row Office Visit from 05/15/2023 in [facility] Office Visit from 03/13/2023 in [facility] Office Visit from 02/01/2023 in [facility] Office Visit from 01/10/2023 in [facility] Office Visit from 12/22/2022 in [facility]  PHQ-2 Total Score 6 5 2 1  0  PHQ-9 Total Score 17 21 10 8 9       Flowsheet Row Office Visit from 05/15/2023 in [facility] Video Visit from 03/21/2023 in [facility] Office Visit from 03/13/2023 in [facility]  C-SSRS RISK CATEGORY No Risk No Risk No Risk        Assessment and Plan: [name] is a 62 year old Caucasian female, married, on disability, lives in [place], has a history of bipolar disorder type II, GAD, multiple medical problems including obstructive sleep apnea on CPAP was evaluated in office today.  Patient with worsening depression symptoms and mild  anxiety, will benefit from the following plan.  Plan Bipolar disorder type II depressed-moderate-unstable Continue Seroquel extended release 350 mg p.o. nightly Increase Depakote ER to 750 mg p.o. daily. Wellbutrin 75 mg p.o. daily  GAD-improving Hydroxyzine 25-50 mg p.o. nightly as needed Depakote dosage increased as noted above Continue CBT with Dr.Nicola  Obstructive sleep apnea on CPAP-improving Continue CPAP for OSA Modafinil 100 mg p.o. daily  High risk medication use-patient with recent dosage increase of Depakote, Depakote level needs to  be monitored due to potential to get toxic in the system and cause adverse side effects.  Hence will get Depakote level done in a week after starting the higher dosage.  I have also ordered LFT.  Patient to go to [facility] lab.  Follow-up in clinic in 4 weeks or sooner if needed.    Collaboration of Care: Collaboration of Care: Referral or follow-up with counselor/therapist AEB encouraged to continue CBT.  Patient/Guardian was advised Release of Information must be obtained prior to any record release in order to collaborate their care with an outside provider. Patient/Guardian was advised if they have not already done so to contact the registration department to sign all necessary forms in order for [place] to release information regarding their care.   Consent: Patient/Guardian gives verbal consent for treatment and assignment of benefits for services provided during this visit. Patient/Guardian expressed understanding and agreed to proceed.   This note was generated in part or whole with voice recognition software. Voice recognition is usually quite accurate but there are transcription errors that can and very often do occur. I apologize for any typographical errors that were not detected and corrected.     Jomarie Longs, MD 05/16/2023, 11:30 AM

## 2023-05-23 ENCOUNTER — Telehealth: Payer: Medicare Other | Admitting: Psychiatry

## 2023-05-23 DIAGNOSIS — F3181 Bipolar II disorder: Secondary | ICD-10-CM | POA: Diagnosis not present

## 2023-05-24 ENCOUNTER — Other Ambulatory Visit
Admission: RE | Admit: 2023-05-24 | Discharge: 2023-05-24 | Disposition: A | Discharge status: Home / Self Care | Payer: Medicare Other | Attending: Psychiatry | Admitting: Psychiatry

## 2023-05-24 DIAGNOSIS — Z79899 Other long term (current) drug therapy: Secondary | ICD-10-CM | POA: Insufficient documentation

## 2023-05-24 DIAGNOSIS — F3181 Bipolar II disorder: Secondary | ICD-10-CM | POA: Diagnosis not present

## 2023-05-24 LAB — HEPATIC FUNCTION PANEL
ALT: 12 U/L (ref 0–44)
AST: 17 U/L (ref 15–41)
Albumin: 4 g/dL (ref 3.5–5.0)
Alkaline Phosphatase: 51 U/L (ref 38–126)
Bilirubin, Direct: 0.1 mg/dL (ref 0.0–0.2)
Indirect Bilirubin: 0.9 mg/dL (ref 0.3–0.9)
Total Bilirubin: 1 mg/dL (ref 0.3–1.2)
Total Protein: 7.3 g/dL (ref 6.5–8.1)

## 2023-05-24 LAB — VALPROIC ACID LEVEL: Valproic Acid Lvl: 89 ug/mL (ref 50.0–100.0)

## 2023-06-08 DIAGNOSIS — F3181 Bipolar II disorder: Secondary | ICD-10-CM | POA: Diagnosis not present

## 2023-06-15 ENCOUNTER — Encounter: Payer: Self-pay | Admitting: Psychiatry

## 2023-06-15 ENCOUNTER — Telehealth (INDEPENDENT_AMBULATORY_CARE_PROVIDER_SITE_OTHER): Payer: Medicare Other | Admitting: Psychiatry

## 2023-06-15 DIAGNOSIS — G4733 Obstructive sleep apnea (adult) (pediatric): Secondary | ICD-10-CM | POA: Diagnosis not present

## 2023-06-15 DIAGNOSIS — F3181 Bipolar II disorder: Secondary | ICD-10-CM | POA: Diagnosis not present

## 2023-06-15 DIAGNOSIS — F411 Generalized anxiety disorder: Secondary | ICD-10-CM

## 2023-06-15 DIAGNOSIS — Z9189 Other specified personal risk factors, not elsewhere classified: Secondary | ICD-10-CM

## 2023-06-15 MED ORDER — QUETIAPINE FUMARATE ER 50 MG PO TB24: ORAL_TABLET | ORAL | 0 refills | Status: DC

## 2023-06-15 NOTE — Patient Instructions (Signed)
Please call for EKG-[id number] 

## 2023-06-15 NOTE — Progress Notes (Signed)
Virtual Visit via Video Note  I connected with [name] on 06/15/23 at  1:30 PM EDT by a video enabled telemedicine application and verified that I am speaking with the correct person using two identifiers.  Location Provider Location : [facility] Patient Location : Home  Participants: Patient , Provider    I discussed the limitations of evaluation and management by telemedicine and the availability of in person appointments. The patient expressed understanding and agreed to proceed.    I discussed the assessment and treatment plan with the patient. The patient was provided an opportunity to ask questions and all were answered. The patient agreed with the plan and demonstrated an understanding of the instructions.   The patient was advised to call back or seek an in-person evaluation if the symptoms worsen or if the condition fails to improve as anticipated.    BH MD OP Progress Note  06/15/2023 1:52 PM [name]  MRN:  [id number]  Chief Complaint:  Chief Complaint  Patient presents with   Follow-up   Depression   Anxiety   Medication Refill   HPI: [name] is a 62 year old Caucasian female on disability, married, currently lives in [place], has a history of bipolar disorder type II, GAD, primary insomnia, OSA on CPAP, hyperlipidemia, osteoporosis, history of cataract surgery was evaluated by telemedicine today.  Patient today reports she is currently struggling with depression symptoms.  She reports she stopped taking the modafinil and she has not felt any difference.  She reports although she does not feel as sad as she used to before she continues to have lack of motivation on a regular basis.  Her spouse has been pushing her to do activities and that helps.  Patient reports she currently goes to bed at around 11 PM and wakes up anywhere between 8:30 AM to 10 AM.  When she wakes up in the morning she continues to feel unmotivated.  She has been  using the CPAP on a regular basis.  Patient denies any suicidality, homicidality or perceptual disturbances.  Patient is currently compliant with psychotherapy.  Patient denies any other concerns today.  Visit Diagnosis:    ICD-10-CM   1. Bipolar 2 disorder, major depressive episode (HCC)  F31.81 EKG 12-Lead   Moderate    2. GAD (generalized anxiety disorder)  F41.1 QUEtiapine (SEROQUEL XR) 50 MG TB24 24 hr tablet    3. OSA on CPAP  G47.33     4. At risk for prolonged QT interval syndrome  Z91.89 EKG 12-Lead      Past Psychiatric History: I have reviewed past psychiatric history from progress note on 09/01/2021.  Past trials of lithium-side effect, Ativan-side effect, Depakote-gained weight, Trileptal, Latuda-akathisia, Celexa, Effexor-made her manic, trazodone, Tranxene, Zoloft, Prozac, BuSpar-not helpful, lamotrigine-restless, Seroquel, Abilify, TCA, modafinil.  Past Medical History:  Past Medical History:  Diagnosis Date   Bipolar 1 disorder (HCC)    Hyperlipidemia    OSA (obstructive sleep apnea)    CPAP dependent   Osteoporosis    PPD positive     Past Surgical History:  Procedure Laterality Date   ABDOMINAL HYSTERECTOMY     CATARACT EXTRACTION, BILATERAL     due to seroquel   ROBOTIC ASSISTED LAPAROSCOPIC HYSTERECTOMY AND SALPINGECTOMY      Family Psychiatric History: Reviewed family psychiatric history from progress note on 09/01/2021.  Family History:  Family History  Problem Relation Age of Onset   Personality disorder Mother    Alcohol abuse Maternal Uncle  Bipolar disorder Cousin    Schizophrenia Other    Breast cancer Neg Hx     Social History: Reviewed social history from progress note on 09/01/2021. Social History   Socioeconomic History   Marital status: Married    Spouse name: eric   Number of children: 2   Years of education: Not on file   Highest education level: Bachelor's degree (e.g., BA, AB, BS)  Occupational History    Comment:  unemployed  Tobacco Use   Smoking status: Never   Smokeless tobacco: Never  Vaping Use   Vaping status: Never Used  Substance and Sexual Activity   Alcohol use: No   Drug use: No   Sexual activity: Yes  Other Topics Concern   Not on file  Social History Narrative   Not on file   Social Determinants of Health   Financial Resource Strain: Not on file  Food Insecurity: Not on file  Transportation Needs: Not on file  Physical Activity: Not on file  Stress: Not on file  Social Connections: Unknown (03/07/2022)   Received from [facility]   Social Network    Social Network: Not on file    Allergies:  Allergies  Allergen Reactions   Tyloxapol Rash   Prilosec [Omeprazole]     Interacts with psych meds   Tylox [Oxycodone-Acetaminophen] Rash    Metabolic Disorder Labs: No results found for: "HGBA1C", "MPG" Lab Results  Component Value Date   PROLACTIN 5.1 09/01/2021   No results found for: "CHOL", "TRIG", "HDL", "CHOLHDL", "VLDL", "LDLCALC" Lab Results  Component Value Date   TSH 0.955 09/01/2021    Therapeutic Level Labs: No results found for: "LITHIUM" Lab Results  Component Value Date   VALPROATE 89 05/24/2023   VALPROATE 69 01/17/2023   No results found for: "CBMZ"  Current Medications: Current Outpatient Medications  Medication Sig Dispense Refill   alendronate (FOSAMAX) 70 MG tablet TAKE 1 TABLET BY MOUTH ONCE WEEKLY 30 MINUTES BEFORE FIRST FOOD, MEDICINE, OR WATER OF THE DAY     buPROPion (WELLBUTRIN) 75 MG tablet Take 1 tablet (75 mg total) by mouth in the morning. 90 tablet 1   divalproex (DEPAKOTE ER) 250 MG 24 hr tablet Take 1 tablet (250 mg total) by mouth daily with supper. Take along with 500 mg daily 90 tablet 0   divalproex (DEPAKOTE ER) 500 MG 24 hr tablet TAKE 1 TABLET(500 MG) BY MOUTH DAILY WITH SUPPER 90 tablet 1   Multiple Vitamin (MULTIVITAMIN) tablet Take 1 tablet by mouth daily.     QUEtiapine (SEROQUEL XR) 300 MG 24 hr  tablet TAKE 1 TABLET(300 MG) BY MOUTH AT BEDTIME 90 tablet 0   simvastatin (ZOCOR) 40 MG tablet Take 40 mg by mouth daily.     valACYclovir (VALTREX) 500 MG tablet 1 tablet Orally Twice a day as needed for outbreak     hydrOXYzine (ATARAX) 25 MG tablet Take 1-2 tablets (25-50 mg total) by mouth at bedtime as needed. For sleep, anxiety (Patient not taking: Reported on 06/15/2023) 60 tablet 1   QUEtiapine (SEROQUEL XR) 50 MG TB24 24 hr tablet TAKE 1 TABLET BY MOUTH AT BEDTIME. TAKE ALONG WITH 300 MG AT BEDTIME 30 tablet 0   No current facility-administered medications for this visit.     Musculoskeletal: Strength & Muscle Tone:  UTA Gait & Station:  Seated Patient leans: N/A  Psychiatric Specialty Exam: Review of Systems  Psychiatric/Behavioral:  Positive for dysphoric mood.     There were  no vitals taken for this visit.There is no height or weight on file to calculate BMI.  General Appearance: Fairly Groomed  Eye Contact:  Fair  Speech:  Clear and Coherent  Volume:  Normal  Mood:  Depressed  Affect:  Congruent  Thought Process:  Goal Directed and Descriptions of Associations: Intact  Orientation:  Full (Time, Place, and Person)  Thought Content: Logical   Suicidal Thoughts:  No  Homicidal Thoughts:  No  Memory:  Immediate;   Fair Recent;   Fair Remote;   Fair  Judgement:  Fair  Insight:  Fair  Psychomotor Activity:  Normal  Concentration:  Concentration: Fair and Attention Span: Fair  Recall:  [employer] of Knowledge: Fair  Language: Fair  Akathisia:  No  Handed:  Right  AIMS (if indicated): not done  Assets:  Communication Skills Desire for Improvement Housing Social Support  ADL's:  Intact  Cognition: WNL  Sleep:   Sleeps better although continues to have fatigue and feels unrested currently on CPAP   Screenings: AIMS    Flowsheet Row Office Visit from 05/15/2023 in [facility] Office Visit from 02/01/2023 in [facility] Office Visit from 01/10/2023 in [facility] Office Visit from 12/22/2022 in [facility] Office Visit from 10/26/2022 in [facility]  AIMS Total Score 0 0 0 0 0      GAD-7    Flowsheet Row Office Visit from 05/15/2023 in [facility] Office Visit from 03/13/2023 in [facility] Office Visit from 02/01/2023 in [facility] Office Visit from 01/10/2023 in [facility] Office Visit from 12/22/2022 in [facility]  Total GAD-7 Score 13 16 7 13 20       PHQ2-9    Flowsheet Row Office Visit from 05/15/2023 in [facility] Office Visit from 03/13/2023 in [facility] Office Visit from 02/01/2023 in [facility] Office Visit from 01/10/2023 in [facility] Office Visit from 12/22/2022 in [facility]  PHQ-2 Total Score 6 5 2 1  0  PHQ-9 Total Score 17 21 10 8 9       Flowsheet Row Video Visit from 06/15/2023 in [facility] Office Visit from 05/15/2023 in [facility] Video Visit from 03/21/2023 in [facility]  C-SSRS RISK CATEGORY Low Risk No Risk No Risk        Assessment and Plan: [name] is a 62 year old Caucasian female, married, on disability, lives in [place], has a history of bipolar disorder type II, GAD, multiple medical problems including obstructive sleep apnea on CPAP was evaluated by telemedicine today.  Patient is  currently struggling with depression symptoms with some improvement, currently noncompliant with modafinil, plan as noted below.  Plan Bipolar disorder type I depressed-unstable Continue Seroquel extended release 350 mg p.o. nightly for now. Will consider adding low-dose of Abilify or risperidone and taper off Seroquel gradually. However will need an EKG first. Continue Depakote ER 750 mg p.o. daily Continue Wellbutrin 75 mg p.o. daily for now however we will plan on tapering off this antidepressant in the future.  GAD-improving Hydroxyzine 25-50 mg p.o. nightly as needed-rarely uses it.  Continue CBT with Dr. Lang Snow  Obstructive sleep apnea on CPAP-improving Continue CPAP for OSA Discontinue modafinil for noncompliance  At risk for prolonged QT syndrome-we will order EKG-patient to call [phone] to get EKG completed.  Once the EKG is completed and reviewed will consider adding a mood stabilizer/antipsychotic as noted above.  Follow-up in clinic in 2 to 3 weeks or sooner if needed.    Consent: Patient/Guardian gives verbal consent for treatment and assignment of benefits for services provided during this visit. Patient/Guardian expressed understanding and agreed to proceed.   This note was generated in part or whole with voice recognition software. Voice recognition is usually quite accurate but there are transcription errors that can and very often do occur. I apologize for any typographical errors that were not detected and corrected.    Jomarie Longs, MD 06/16/2023, 8:32 AM

## 2023-06-20 ENCOUNTER — Ambulatory Visit: Admission: RE | Admit: 2023-06-20 | Payer: Medicare Other | Source: Ambulatory Visit

## 2023-06-20 ENCOUNTER — Telehealth: Payer: Self-pay | Admitting: Psychiatry

## 2023-06-20 DIAGNOSIS — F3181 Bipolar II disorder: Secondary | ICD-10-CM | POA: Insufficient documentation

## 2023-06-20 DIAGNOSIS — Z9189 Other specified personal risk factors, not elsewhere classified: Secondary | ICD-10-CM | POA: Diagnosis not present

## 2023-06-20 DIAGNOSIS — F3162 Bipolar disorder, current episode mixed, moderate: Secondary | ICD-10-CM

## 2023-06-20 MED ORDER — RISPERIDONE 0.25 MG PO TABS: 0.2500 mg | ORAL_TABLET | Freq: Every day | ORAL | 0 refills | Status: DC

## 2023-06-20 NOTE — Telephone Encounter (Signed)
Contacted patient to discuss EKG, okay to start risperidone as discussed, will start risperidone 0.25 mg daily.  Provided medication education. Start tapering down Seroquel, will reduce Seroquel to 300 mg at bedtime with plan to gradually taper off.  Patient to keep her upcoming appointment.

## 2023-06-22 DIAGNOSIS — F3181 Bipolar II disorder: Secondary | ICD-10-CM | POA: Diagnosis not present

## 2023-07-04 ENCOUNTER — Encounter: Payer: Self-pay | Admitting: Psychiatry

## 2023-07-04 ENCOUNTER — Telehealth (INDEPENDENT_AMBULATORY_CARE_PROVIDER_SITE_OTHER): Payer: Medicare Other | Admitting: Psychiatry

## 2023-07-04 DIAGNOSIS — F411 Generalized anxiety disorder: Secondary | ICD-10-CM

## 2023-07-04 DIAGNOSIS — G4733 Obstructive sleep apnea (adult) (pediatric): Secondary | ICD-10-CM | POA: Diagnosis not present

## 2023-07-04 DIAGNOSIS — F3132 Bipolar disorder, current episode depressed, moderate: Secondary | ICD-10-CM | POA: Diagnosis not present

## 2023-07-04 DIAGNOSIS — Z9189 Other specified personal risk factors, not elsewhere classified: Secondary | ICD-10-CM

## 2023-07-04 MED ORDER — QUETIAPINE FUMARATE ER 200 MG PO TB24: 200.0000 mg | ORAL_TABLET | Freq: Every day | ORAL | 1 refills | Status: DC

## 2023-07-04 MED ORDER — RISPERIDONE 0.5 MG PO TABS: 0.5000 mg | ORAL_TABLET | Freq: Every day | ORAL | 1 refills | Status: DC

## 2023-07-04 NOTE — Progress Notes (Signed)
Virtual Visit via Video Note  I connected with [name] on 07/04/23 at  9:00 AM EDT by a video enabled telemedicine application and verified that I am speaking with the correct person using two identifiers.  Location Provider Location : [facility] Patient Location : Home  Participants: Patient , Provider   I discussed the limitations of evaluation and management by telemedicine and the availability of in person appointments. The patient expressed understanding and agreed to proceed.   I discussed the assessment and treatment plan with the patient. The patient was provided an opportunity to ask questions and all were answered. The patient agreed with the plan and demonstrated an understanding of the instructions.   The patient was advised to call back or seek an in-person evaluation if the symptoms worsen or if the condition fails to improve as anticipated.    BH MD OP Progress Note  07/04/2023 9:33 AM [name]  MRN:  [id number]  Chief Complaint:  Chief Complaint  Patient presents with   Follow-up   Depression   Anxiety   Medication Refill   HPI: [name] is a 62 year old Caucasian female on disability, married, currently lives in [place] has a history of bipolar disorder , GAD, primary insomnia, OSA on CPAP, hyperlipidemia, osteoporosis, history of cataract surgery was evaluated by telemedicine today.  Patient today reports since being on the lower dosage of Seroquel as well as the addition of risperidone her depression symptoms have improved.  She reports she feels more motivated to do activities throughout the day.  She reports sleep at night as good.  She currently sleeps around 10 to 11 hours.  She denies any side effects to the risperidone.  She is tolerating the lower dosage of Seroquel.  She is compliant on the CPAP for OSA.  Patient denies any suicidality, homicidality or perceptual disturbances.  Patient reports she continues to follow-up  with her therapist on a regular basis, therapy sessions are beneficial.  Her spouse continues to be supportive  Patient denies any other concerns today.  Visit Diagnosis:    ICD-10-CM   1. Bipolar 1 disorder, depressed, moderate (HCC)  F31.32 QUEtiapine (SEROQUEL XR) 200 MG 24 hr tablet    risperiDONE (RISPERDAL) 0.5 MG tablet    2. GAD (generalized anxiety disorder)  F41.1     3. OSA on CPAP  G47.33     4. At risk for prolonged QT interval syndrome  Z91.89       Past Psychiatric History: I have reviewed past psychiatric history from progress note on 09/01/2021.  Past trials of lithium-side effect, Ativan-side effect, Depakote-gained weight with Trileptal, Latuda-acathisia CM, Effexor-made her manic, trazodone, Tranxene, Zoloft, Prozac, BuSpar-not helpful, lamotrigine-restless, Seroquel, Abilify, TCA, modafinil.  Past Medical History:  Past Medical History:  Diagnosis Date   Bipolar 1 disorder (HCC)    Hyperlipidemia    OSA (obstructive sleep apnea)    CPAP dependent   Osteoporosis    PPD positive     Past Surgical History:  Procedure Laterality Date   ABDOMINAL HYSTERECTOMY     CATARACT EXTRACTION, BILATERAL     due to seroquel   ROBOTIC ASSISTED LAPAROSCOPIC HYSTERECTOMY AND SALPINGECTOMY      Family Psychiatric History: I have reviewed family psychiatric history from progress note on 09/01/2021.  Family History:  Family History  Problem Relation Age of Onset   Personality disorder Mother    Alcohol abuse Maternal Uncle    Bipolar disorder Cousin    Schizophrenia Other  Breast cancer Neg Hx     Social History: I have reviewed social history from progress note on 09/01/2021. Social History   Socioeconomic History   Marital status: Married    Spouse name: eric   Number of children: 2   Years of education: Not on file   Highest education level: Bachelor's degree (e.g., BA, AB, BS)  Occupational History    Comment: unemployed  Tobacco Use   Smoking status:  Never   Smokeless tobacco: Never  Vaping Use   Vaping status: Never Used  Substance and Sexual Activity   Alcohol use: No   Drug use: No   Sexual activity: Yes  Other Topics Concern   Not on file  Social History Narrative   Not on file   Social Determinants of Health   Financial Resource Strain: Not on file  Food Insecurity: Not on file  Transportation Needs: Not on file  Physical Activity: Not on file  Stress: Not on file  Social Connections: Unknown (03/07/2022)   Received from [facility]   Social Network    Social Network: Not on file    Allergies:  Allergies  Allergen Reactions   Tyloxapol Rash   Prilosec [Omeprazole]     Interacts with psych meds   Tylox [Oxycodone-Acetaminophen] Rash    Metabolic Disorder Labs: No results found for: "HGBA1C", "MPG" Lab Results  Component Value Date   PROLACTIN 5.1 09/01/2021   No results found for: "CHOL", "TRIG", "HDL", "CHOLHDL", "VLDL", "LDLCALC" Lab Results  Component Value Date   TSH 0.955 09/01/2021    Therapeutic Level Labs: No results found for: "LITHIUM" Lab Results  Component Value Date   VALPROATE 89 05/24/2023   VALPROATE 69 01/17/2023   No results found for: "CBMZ"  Current Medications: Current Outpatient Medications  Medication Sig Dispense Refill   QUEtiapine (SEROQUEL XR) 200 MG 24 hr tablet Take 1 tablet (200 mg total) by mouth at bedtime. Stop seroquel XR 300 mg 30 tablet 1   risperiDONE (RISPERDAL) 0.5 MG tablet Take 1 tablet (0.5 mg total) by mouth at bedtime. Dose increase 30 tablet 1   alendronate (FOSAMAX) 70 MG tablet TAKE 1 TABLET BY MOUTH ONCE WEEKLY 30 MINUTES BEFORE FIRST FOOD, MEDICINE, OR WATER OF THE DAY     buPROPion (WELLBUTRIN) 75 MG tablet Take 1 tablet (75 mg total) by mouth in the morning. 90 tablet 1   divalproex (DEPAKOTE ER) 250 MG 24 hr tablet Take 1 tablet (250 mg total) by mouth daily with supper. Take along with 500 mg daily 90 tablet 0   divalproex  (DEPAKOTE ER) 500 MG 24 hr tablet TAKE 1 TABLET(500 MG) BY MOUTH DAILY WITH SUPPER 90 tablet 1   hydrOXYzine (ATARAX) 25 MG tablet Take 1-2 tablets (25-50 mg total) by mouth at bedtime as needed. For sleep, anxiety (Patient not taking: Reported on 06/15/2023) 60 tablet 1   Multiple Vitamin (MULTIVITAMIN) tablet Take 1 tablet by mouth daily.     simvastatin (ZOCOR) 40 MG tablet Take 40 mg by mouth daily.     valACYclovir (VALTREX) 500 MG tablet 1 tablet Orally Twice a day as needed for outbreak     No current facility-administered medications for this visit.     Musculoskeletal: Strength & Muscle Tone:  UTA Gait & Station:  Seated Patient leans: N/A  Psychiatric Specialty Exam: Review of Systems  Psychiatric/Behavioral:  Positive for dysphoric mood.     There were no vitals taken for this visit.There is no  height or weight on file to calculate BMI.  General Appearance: Fairly Groomed  Eye Contact:  Fair  Speech:  Clear and Coherent  Volume:  Normal  Mood:  Depressed  Affect:  Appropriate  Thought Process:  Goal Directed and Descriptions of Associations: Intact  Orientation:  Full (Time, Place, and Person)  Thought Content: Logical   Suicidal Thoughts:  No  Homicidal Thoughts:  No  Memory:  Immediate;   Fair Recent;   Fair Remote;   Fair  Judgement:  Fair  Insight:  Fair  Psychomotor Activity:  Normal  Concentration:  Concentration: Fair and Attention Span: Fair  Recall:  [employer] of Knowledge: Fair  Language: Fair  Akathisia:  No  Handed:  Right  AIMS (if indicated): not done  Assets:  Communication Skills Desire for Improvement Housing Social Support  ADL's:  Intact  Cognition: WNL  Sleep:  Fair   Screenings: Midwife Visit from 05/15/2023 in [facility] Office Visit from 02/01/2023 in [facility] Office Visit from 01/10/2023 in [facility] Office Visit from 12/22/2022 in [facility] Office Visit from 10/26/2022 in [facility]  AIMS Total Score 0 0 0 0 0      GAD-7    Flowsheet Row Office Visit from 05/15/2023 in [facility] Office Visit from 03/13/2023 in [facility] Office Visit from 02/01/2023 in [facility] Office Visit from 01/10/2023 in [facility] Office Visit from 12/22/2022 in [facility]  Total GAD-7 Score 13 16 7 13 20       PHQ2-9    Flowsheet Row Office Visit from 05/15/2023 in [facility] Office Visit from 03/13/2023 in [facility] Office Visit from 02/01/2023 in [facility] Office Visit from 01/10/2023 in [facility] Office Visit from 12/22/2022 in [facility]  PHQ-2 Total Score 6 5 2 1  0  PHQ-9 Total Score 17 21 10 8 9       Flowsheet Row Video Visit from 07/04/2023 in [facility] Video Visit from 06/15/2023 in [facility] Office Visit from 05/15/2023 in [facility]  C-SSRS RISK CATEGORY Low Risk Low Risk No Risk        Assessment and Plan: [name] is a 62 year old Caucasian female, married, on disability, lives in [place], has a history of bipolar disorder type II, GAD, multiple medical problems including obstructive sleep apnea on CPAP was evaluated by telemedicine today.  Patient currently undergoing cross titration of risperidone with Seroquel.  Patient currently reports  improvement with regards to her mood symptoms, will continue to benefit from medication management, plan as noted below.  Plan Bipolar disorder type I depressed-improving Reduce Seroquel extended release to 200 mg p.o. nightly Increase risperidone to 0.5 mg p.o. nightly Continue Depakote ER 750 mg p.o. daily Depakote level-05/24/2023-89-therapeutic Wellbutrin 75 mg p.o. daily in the morning  GAD-improving Hydroxyzine 25-50 mg p.o. nightly as needed-rarely uses it. Continues CBT with Dr.Nicola  At risk for prolonged QT syndrome-reviewed and discussed EKG dated 06/20/2023-normal sinus rhythm    Collaboration of Care: Collaboration of Care: Referral or follow-up with  counselor/therapist AEB patient agrees to continue CBT  Patient/Guardian was advised Release of Information must be obtained prior to any record release in order to collaborate their care with an outside provider. Patient/Guardian was advised if they have not already done so to contact the registration department to sign all necessary forms in order for [place] to release information regarding their care.   Consent: Patient/Guardian gives verbal consent for treatment and assignment of benefits for services provided during this visit. Patient/Guardian expressed understanding and agreed to proceed.  Follow-up in clinic in 3 to 4 weeks or sooner if needed.  This note was generated in part or whole with voice recognition software. Voice recognition is usually quite accurate but there are transcription errors that can and very often do occur. I apologize for any typographical errors that were not detected and corrected.     Jomarie Longs, MD 07/04/2023, 9:33 AM

## 2023-07-05 DIAGNOSIS — F3181 Bipolar II disorder: Secondary | ICD-10-CM | POA: Diagnosis not present

## 2023-07-25 ENCOUNTER — Encounter: Payer: Self-pay | Admitting: Psychiatry

## 2023-07-25 ENCOUNTER — Telehealth (INDEPENDENT_AMBULATORY_CARE_PROVIDER_SITE_OTHER): Payer: Medicare Other | Admitting: Psychiatry

## 2023-07-25 DIAGNOSIS — F3175 Bipolar disorder, in partial remission, most recent episode depressed: Secondary | ICD-10-CM

## 2023-07-25 DIAGNOSIS — G4733 Obstructive sleep apnea (adult) (pediatric): Secondary | ICD-10-CM

## 2023-07-25 DIAGNOSIS — F411 Generalized anxiety disorder: Secondary | ICD-10-CM | POA: Diagnosis not present

## 2023-07-25 MED ORDER — QUETIAPINE FUMARATE ER 50 MG PO TB24: 50.0000 mg | ORAL_TABLET | Freq: Every day | ORAL | 0 refills | Status: DC

## 2023-07-25 MED ORDER — RISPERIDONE 0.25 MG PO TABS: 0.2500 mg | ORAL_TABLET | Freq: Every day | ORAL | 1 refills | Status: DC

## 2023-07-25 MED ORDER — DIVALPROEX SODIUM ER 250 MG PO TB24: 250.0000 mg | ORAL_TABLET | Freq: Every day | ORAL | 1 refills | Status: DC

## 2023-07-25 NOTE — Progress Notes (Signed)
Virtual Visit via Video Note  I connected with [name] on 07/25/23 at  8:30 AM EDT by a video enabled telemedicine application and verified that I am speaking with the correct person using two identifiers.  Location Provider Location : [facility] Patient Location : Home  Participants: Patient , Provider   I discussed the limitations of evaluation and management by telemedicine and the availability of in person appointments. The patient expressed understanding and agreed to proceed.   I discussed the assessment and treatment plan with the patient. The patient was provided an opportunity to ask questions and all were answered. The patient agreed with the plan and demonstrated an understanding of the instructions.   The patient was advised to call back or seek an in-person evaluation if the symptoms worsen or if the condition fails to improve as anticipated.   BH MD OP Progress Note  07/25/2023 2:09 PM ELLAJANE STONG  MRN:  [id number]  Chief Complaint:  Chief Complaint  Patient presents with   Depression   Anxiety   Manic Behavior   Medication Refill   Follow-up   HPI: [name] is a 62 year old Caucasian female, on disability, married, currently lives in [place], has a history of bipolar disorder, GAD, primary insomnia, OSA on CPAP, hyperlipidemia, osteoporosis, history of cataract surgery was evaluated by telemedicine today.  Patient today reports she feels much better compared to how she was doing previously with regards to her mood.  She does not feel as depressed as she was before.  She has more energy when she wakes up in the morning and is able to do a lot more activities during the day.  She continues to have difficulty falling asleep.  Patient reports she takes all her medications for bedtime at around 7 PM and most days is able to fall asleep by 11 PM.  Some nights she is unable to fall asleep and has restless sleep throughout the night.  She  continues to use CPAP for her obstructive sleep apnea.  Sleep restlessness happened couple of times in the past 2 weeks.  Patient also believes she has been overeating.  Motivated to start watching her diet.  She denies any suicidality, homicidality or perceptual disturbances.  Continues follow-up with her therapist.  Patient reports she is tolerating the risperidone as well as the lower dosage of Seroquel well.  Agreeable to continue to make medication management.  Denies any other concerns today.    Visit Diagnosis:    ICD-10-CM   1. Bipolar disorder, in partial remission, most recent episode depressed (HCC)  F31.75 QUEtiapine (SEROQUEL XR) 50 MG TB24 24 hr tablet    risperiDONE (RISPERDAL) 0.25 MG tablet   Type I    2. GAD (generalized anxiety disorder)  F41.1 divalproex (DEPAKOTE ER) 250 MG 24 hr tablet    3. OSA on CPAP  G47.33       Past Psychiatric History: I have reviewed past psychiatric history from progress note on 09/01/2021.  Past trials of lithium-side effect, Ativan-side effect, Depakote, Trileptal-gained weight, Latuda-akathisia, Effexor-made her manic, trazodone, Tranxene, Zoloft, Prozac, BuSpar-not helpful, Lamictal-restless, Seroquel, Abilify, TCA, modafinil.  Past Medical History:  Past Medical History:  Diagnosis Date   Bipolar 1 disorder (HCC)    Hyperlipidemia    OSA (obstructive sleep apnea)    CPAP dependent   Osteoporosis    PPD positive     Past Surgical History:  Procedure Laterality Date   ABDOMINAL HYSTERECTOMY     CATARACT EXTRACTION,  BILATERAL     due to seroquel   ROBOTIC ASSISTED LAPAROSCOPIC HYSTERECTOMY AND SALPINGECTOMY      Family Psychiatric History: Reviewed family psychiatric history from progress note on 09/01/2021.  Family History:  Family History  Problem Relation Age of Onset   Personality disorder Mother    Alcohol abuse Maternal Uncle    Bipolar disorder Cousin    Schizophrenia Other    Breast cancer Neg Hx      Social History: Reviewed social history from progress note on 09/01/2021. Social History   Socioeconomic History   Marital status: Married    Spouse name: eric   Number of children: 2   Years of education: Not on file   Highest education level: Bachelor's degree (e.g., BA, AB, BS)  Occupational History    Comment: unemployed  Tobacco Use   Smoking status: Never   Smokeless tobacco: Never  Vaping Use   Vaping status: Never Used  Substance and Sexual Activity   Alcohol use: No   Drug use: No   Sexual activity: Yes  Other Topics Concern   Not on file  Social History Narrative   Not on file   Social Determinants of Health   Financial Resource Strain: Not on file  Food Insecurity: Not on file  Transportation Needs: Not on file  Physical Activity: Not on file  Stress: Not on file  Social Connections: Unknown (03/07/2022)   [facility]   Social Network    Social Network: Not on file    Allergies:  Allergies  Allergen Reactions   Tyloxapol Rash   Prilosec [Omeprazole]     Interacts with psych meds   Tylox [Oxycodone-Acetaminophen] Rash    Metabolic Disorder Labs: No results found for: "HGBA1C", "MPG" Lab Results  Component Value Date   PROLACTIN 5.1 09/01/2021   No results found for: "CHOL", "TRIG", "HDL", "CHOLHDL", "VLDL", "LDLCALC" Lab Results  Component Value Date   TSH 0.955 09/01/2021    Therapeutic Level Labs: No results found for: "LITHIUM" Lab Results  Component Value Date   VALPROATE 89 05/24/2023   VALPROATE 69 01/17/2023   No results found for: "CBMZ"  Current Medications: Current Outpatient Medications  Medication Sig Dispense Refill   alendronate (FOSAMAX) 70 MG tablet TAKE 1 TABLET BY MOUTH ONCE WEEKLY 30 MINUTES BEFORE FIRST FOOD, MEDICINE, OR WATER OF THE DAY     buPROPion (WELLBUTRIN) 75 MG tablet Take 1 tablet (75 mg total) by mouth in the morning. 90 tablet 1   divalproex (DEPAKOTE ER) 500 MG 24 hr  tablet TAKE 1 TABLET(500 MG) BY MOUTH DAILY WITH SUPPER 90 tablet 1   hydrOXYzine (ATARAX) 25 MG tablet Take 1-2 tablets (25-50 mg total) by mouth at bedtime as needed. For sleep, anxiety 60 tablet 1   Multiple Vitamin (MULTIVITAMIN) tablet Take 1 tablet by mouth daily.     QUEtiapine (SEROQUEL XR) 50 MG TB24 24 hr tablet Take 1 tablet (50 mg total) by mouth at bedtime. 90 tablet 0   risperiDONE (RISPERDAL) 0.25 MG tablet Take 1 tablet (0.25 mg total) by mouth at bedtime. Take along with 0.5 mg at bedtime , total of 0.75 mg daily. 30 tablet 1   risperiDONE (RISPERDAL) 0.5 MG tablet Take 1 tablet (0.5 mg total) by mouth at bedtime. Dose increase 30 tablet 1   simvastatin (ZOCOR) 40 MG tablet Take 40 mg by mouth daily.     valACYclovir (VALTREX) 500 MG tablet 1 tablet Orally Twice a day  as needed for outbreak     divalproex (DEPAKOTE ER) 250 MG 24 hr tablet Take 1 tablet (250 mg total) by mouth daily with supper. Take along with 500 mg daily 90 tablet 1   No current facility-administered medications for this visit.     Musculoskeletal: Strength & Muscle Tone:  UTA Gait & Station:  Seated Patient leans: N/A  Psychiatric Specialty Exam: Review of Systems  Psychiatric/Behavioral:  Positive for decreased concentration, dysphoric mood and sleep disturbance.     There were no vitals taken for this visit.There is no height or weight on file to calculate BMI.  General Appearance: Fairly Groomed  Eye Contact:  Fair  Speech:  Clear and Coherent  Volume:  Normal  Mood:  Depressed improving  Affect:  Congruent  Thought Process:  Goal Directed and Descriptions of Associations: Intact  Orientation:  Full (Time, Place, and Person)  Thought Content: Logical   Suicidal Thoughts:  No  Homicidal Thoughts:  No  Memory:  Immediate;   Fair Recent;   Fair Remote;   Fair  Judgement:  Fair  Insight:  Fair  Psychomotor Activity:  Normal  Concentration:  Concentration: Fair and Attention Span: Fair   Recall:  [employer] of Knowledge: Fair  Language: Fair  Akathisia:  No  Handed:  Right  AIMS (if indicated): not done  Assets:  Communication Skills Desire for Improvement Housing Social Support  ADL's:  Intact  Cognition: WNL  Sleep:  Poor   Screenings: Geneticist, molecular Office Visit from 05/15/2023 in [facility] Office Visit from 02/01/2023 in [facility] Office Visit from 01/10/2023 in [facility] Office Visit from 12/22/2022 in [facility] Office Visit from 10/26/2022 in [facility]  AIMS Total Score 0 0 0 0 0      GAD-7    Flowsheet Row Office Visit from 05/15/2023 in [facility] Office Visit from 03/13/2023 in [facility] Office Visit from 02/01/2023 in [facility] Office Visit from 01/10/2023 in [facility] Office Visit from 12/22/2022 in [facility]  Total GAD-7 Score 13 16 7 13 20       PHQ2-9    Flowsheet Row Video Visit from 07/25/2023 in [facility] Office Visit from 05/15/2023 in [facility] Office Visit from 03/13/2023 in [facility] Office Visit from 02/01/2023 in [facility] Office Visit from 01/10/2023 in [facility]  PHQ-2 Total Score 2 6 5 2 1   PHQ-9 Total Score 11 17 21 10 8       Flowsheet Row Video Visit from 07/25/2023 in [facility] Video Visit from 07/04/2023 in [facility] Video  Visit from 06/15/2023 in [facility]  C-SSRS RISK CATEGORY Low Risk Low Risk Low Risk        Assessment and Plan: SHAILI DONALSON is a 62 year old Caucasian female, married, disability, lives in [place], has a history of bipolar disorder type II, GAD, multiple medical problems including obstructive sleep apnea on CPAP was evaluated by telemedicine today.  Patient is currently improving however will continue to need medication readjustment for residual symptoms, plan as  noted below.  Plan Bipolar disorder type I depressed in partial remission Will reduce Seroquel extended release to 50 mg p.o. nightly Increase risperidone to 0.75 mg p.o. nightly Depakote ER 750 mg p.o. daily Depakote level-05/24/2023-89-therapeutic Wellbutrin 75 mg p.o. daily in the morning.  GAD-improving Hydroxyzine 25-50 mg p.o. nightly as needed.  Encouraged to use this at night since Seroquel is being tapered off, could help with sleep as well. Continue CBT with Dr. Lang Snow.  I have reviewed notes per Dr. Lang Snow dated 07/05/2023-patient is currently advised to continue CBT.     Collaboration of Care: Collaboration of Care: Referral or follow-up with counselor/therapist AEB ) to continue CBT  Patient/Guardian was advised Release of Information must be obtained prior to any record release in order to collaborate their care with an outside provider. Patient/Guardian was advised if they have not already done so to contact the registration department to sign all necessary forms in order for [place] to release information regarding their care.   Consent: Patient/Guardian gives verbal consent for treatment and assignment of benefits for services provided during this visit. Patient/Guardian expressed understanding and agreed to proceed.   Follow-up in clinic in 4 weeks or sooner if needed.  This note was generated in part or whole with voice recognition software. Voice recognition  is usually quite accurate but there are transcription errors that can and very often do occur. I apologize for any typographical errors that were not detected and corrected.  This note was generated in part or whole with voice recognition software. Voice recognition is usually quite accurate but there are transcription errors that can and very often do occur. I apologize for any typographical errors that were not detected and corrected.    Jomarie Longs, MD 07/25/2023, 2:09 PM

## 2023-08-08 DIAGNOSIS — F3181 Bipolar II disorder: Secondary | ICD-10-CM | POA: Diagnosis not present

## 2023-08-10 DIAGNOSIS — K080 Exfoliation of teeth due to systemic causes: Secondary | ICD-10-CM | POA: Diagnosis not present

## 2023-08-13 ENCOUNTER — Other Ambulatory Visit: Payer: Self-pay | Admitting: Psychiatry

## 2023-08-13 DIAGNOSIS — R413 Other amnesia: Secondary | ICD-10-CM

## 2023-08-22 ENCOUNTER — Encounter: Payer: Self-pay | Admitting: Psychiatry

## 2023-08-22 ENCOUNTER — Telehealth (INDEPENDENT_AMBULATORY_CARE_PROVIDER_SITE_OTHER): Payer: Medicare Other | Admitting: Psychiatry

## 2023-08-22 DIAGNOSIS — F3176 Bipolar disorder, in full remission, most recent episode depressed: Secondary | ICD-10-CM | POA: Diagnosis not present

## 2023-08-22 DIAGNOSIS — G4733 Obstructive sleep apnea (adult) (pediatric): Secondary | ICD-10-CM | POA: Diagnosis not present

## 2023-08-22 DIAGNOSIS — F411 Generalized anxiety disorder: Secondary | ICD-10-CM

## 2023-08-22 NOTE — Progress Notes (Signed)
Virtual Visit via Video Note  I connected with [name] on 08/22/23 at 11:00 AM EDT by a video enabled telemedicine application and verified that I am speaking with the correct person using two identifiers.  Location Provider Location : [facility] Patient Location : Home  Participants: Patient , Provider    I discussed the limitations of evaluation and management by telemedicine and the availability of in person appointments. The patient expressed understanding and agreed to proceed.   I discussed the assessment and treatment plan with the patient. The patient was provided an opportunity to ask questions and all were answered. The patient agreed with the plan and demonstrated an understanding of the instructions.   The patient was advised to call back or seek an in-person evaluation if the symptoms worsen or if the condition fails to improve as anticipated.   BH MD OP Progress Note  08/22/2023 2:34 PM [name]  MRN:  [id number]  Chief Complaint:  Chief Complaint  Patient presents with   Follow-up   Depression   Anxiety   Medication Refill   Insomnia   HPI: [name] is a 62 year old Caucasian female, on disability, married, currently lives in [place], has a history of bipolar disorder, GAD, primary insomnia, OSA on CPAP, hyperlipidemia, osteoporosis, history of cataract surgery was evaluated by telemedicine today.  Patient today reports she has noticed good improvement with regards to her mood symptoms.  She does not feel depressed at this time.  She is able to be more active and do a lot more activities.  She reports anxiety symptoms are better.  She started driving again, currently going short distances from her home.  She is excited about that.  Patient for sleep continues to be restless on and off.  She still has difficulty falling asleep.  She takes all her medications by 7 PM and falls asleep at around 11 PM.  She has been compliant with the  CPAP.  She reports she took hydroxyzine as needed recently and that may have helped with her sleep better.  Patient currently denies any appetite changes.  Denies any suicidality, homicidality or perceptual disturbances.  Patient appeared to be alert, oriented to person place time situation.  3 word memory immediate 3 out of 3, after 5 minutes 2 out of 3.  Patient was able to do math well, serial sevens, attention and focus seem to be good.  Patient continues to follow up with her therapist on a regular basis.  Patient is interested in tapering off of the Seroquel further.  Patient denies any other concerns today.  Visit Diagnosis:    ICD-10-CM   1. Bipolar disorder, in full remission, most recent episode depressed (HCC)  F31.76    Type I    2. GAD (generalized anxiety disorder)  F41.1     3. OSA on CPAP  G47.33       Past Psychiatric History: I have reviewed past psychiatric history from progress note on 09/01/2021.  Past trials of lithium-side effect, Ativan-side effect, Depakote, Trileptal-gained weight, Latuda-akathisia, Effexor-made her manic, trazodone,Tranxene, Zoloft, Prozac, BuSpar-not helpful, Lamictal-restless, Seroquel, Abilify, TCA, modafinil.  Past Medical History:  Past Medical History:  Diagnosis Date   Bipolar 1 disorder (HCC)    Hyperlipidemia    OSA (obstructive sleep apnea)    CPAP dependent   Osteoporosis    PPD positive     Past Surgical History:  Procedure Laterality Date   ABDOMINAL HYSTERECTOMY     CATARACT EXTRACTION, BILATERAL  due to seroquel   ROBOTIC ASSISTED LAPAROSCOPIC HYSTERECTOMY AND SALPINGECTOMY      Family Psychiatric History: I have reviewed family psychiatric history from progress note on 09/01/2021.  Family History:  Family History  Problem Relation Age of Onset   Personality disorder Mother    Alcohol abuse Maternal Uncle    Bipolar disorder Cousin    Schizophrenia Other    Breast cancer Neg Hx     Social History:  Reviewed social history from progress note on 09/01/2021. Social History   Socioeconomic History   Marital status: Married    Spouse name: eric   Number of children: 2   Years of education: Not on file   Highest education level: Bachelor's degree (e.g., BA, AB, BS)  Occupational History    Comment: unemployed  Tobacco Use   Smoking status: Never   Smokeless tobacco: Never  Vaping Use   Vaping status: Never Used  Substance and Sexual Activity   Alcohol use: No   Drug use: No   Sexual activity: Yes  Other Topics Concern   Not on file  Social History Narrative   Not on file   Social Determinants of Health   Financial Resource Strain: Not on file  Food Insecurity: Not on file  Transportation Needs: Not on file  Physical Activity: Not on file  Stress: Not on file  Social Connections: Unknown (03/07/2022)   Received from [facility]   Social Network    Social Network: Not on file    Allergies:  Allergies  Allergen Reactions   Tyloxapol Rash   Prilosec [Omeprazole]     Interacts with psych meds   Tylox [Oxycodone-Acetaminophen] Rash    Metabolic Disorder Labs: No results found for: "HGBA1C", "MPG" Lab Results  Component Value Date   PROLACTIN 5.1 09/01/2021   No results found for: "CHOL", "TRIG", "HDL", "CHOLHDL", "VLDL", "LDLCALC" Lab Results  Component Value Date   TSH 0.955 09/01/2021    Therapeutic Level Labs: No results found for: "LITHIUM" Lab Results  Component Value Date   VALPROATE 89 05/24/2023   VALPROATE 69 01/17/2023   No results found for: "CBMZ"  Current Medications: Current Outpatient Medications  Medication Sig Dispense Refill   alendronate (FOSAMAX) 70 MG tablet TAKE 1 TABLET BY MOUTH ONCE WEEKLY 30 MINUTES BEFORE FIRST FOOD, MEDICINE, OR WATER OF THE DAY     buPROPion (WELLBUTRIN) 75 MG tablet TAKE 1 TABLET(75 MG) BY MOUTH IN THE MORNING 90 tablet 1   divalproex (DEPAKOTE ER) 250 MG 24 hr tablet Take 1 tablet (250 mg  total) by mouth daily with supper. Take along with 500 mg daily 90 tablet 1   divalproex (DEPAKOTE ER) 500 MG 24 hr tablet TAKE 1 TABLET(500 MG) BY MOUTH DAILY WITH SUPPER 90 tablet 1   hydrOXYzine (ATARAX) 25 MG tablet Take 1-2 tablets (25-50 mg total) by mouth at bedtime as needed. For sleep, anxiety 60 tablet 1   Multiple Vitamin (MULTIVITAMIN) tablet Take 1 tablet by mouth daily.     QUEtiapine (SEROQUEL XR) 50 MG TB24 24 hr tablet Take 1 tablet (50 mg total) by mouth at bedtime. 90 tablet 0   risperiDONE (RISPERDAL) 0.25 MG tablet Take 1 tablet (0.25 mg total) by mouth at bedtime. Take along with 0.5 mg at bedtime , total of 0.75 mg daily. 30 tablet 1   risperiDONE (RISPERDAL) 0.5 MG tablet Take 1 tablet (0.5 mg total) by mouth at bedtime. Dose increase 30 tablet 1   simvastatin (  ZOCOR) 40 MG tablet Take 40 mg by mouth daily.     valACYclovir (VALTREX) 500 MG tablet 1 tablet Orally Twice a day as needed for outbreak     No current facility-administered medications for this visit.     Musculoskeletal: Strength & Muscle Tone:  UTA Gait & Station:  Seated Patient leans: N/A  Psychiatric Specialty Exam: Review of Systems  Psychiatric/Behavioral:  Positive for sleep disturbance.     There were no vitals taken for this visit.There is no height or weight on file to calculate BMI.  General Appearance: Casual  Eye Contact:  Fair  Speech:  Normal Rate  Volume:  Normal  Mood:  Euthymic  Affect:  Congruent  Thought Process:  Goal Directed and Descriptions of Associations: Intact  Orientation:  Full (Time, Place, and Person)  Thought Content: Logical   Suicidal Thoughts:  No  Homicidal Thoughts:  No  Memory:  Immediate;   Fair Recent;   Fair Remote;   Fair  Judgement:  Fair  Insight:  Fair  Psychomotor Activity:  Normal  Concentration:  Concentration: Fair and Attention Span: Fair  Recall:  [employer] of Knowledge: Fair  Language: Fair  Akathisia:  No  Handed:  Right  AIMS (if  indicated): not done  Assets:  Communication Skills Desire for Improvement Housing Intimacy Social Support  ADL's:  Intact  Cognition: WNL  Sleep:   restless at times   Screenings: AIMS    Flowsheet Row Office Visit from 05/15/2023 in [facility] Office Visit from 02/01/2023 in [facility] Office Visit from 01/10/2023 in [facility] Office Visit from 12/22/2022 in [facility] Office Visit from 10/26/2022 in [facility]  AIMS Total Score 0 0 0 0 0      GAD-7    Flowsheet Row Office Visit from 05/15/2023 in [facility] Office Visit from 03/13/2023 in [facility] Office Visit from 02/01/2023 in [facility] Office Visit from 01/10/2023 in [facility] Office Visit from 12/22/2022 in [facility]  Total GAD-7 Score 13 16 7 13 20       PHQ2-9    Flowsheet Row Video Visit from 07/25/2023 in [facility] Office Visit from 05/15/2023 in [facility] Office Visit from 03/13/2023 in [facility] Office Visit from 02/01/2023 in [facility] Office Visit from 01/10/2023 in [facility]  PHQ-2 Total Score 2 6 5 2 1   PHQ-9 Total Score 11 17 21 10 8       Flowsheet Row Video Visit from 08/22/2023 in [facility] Video Visit from 07/25/2023 in [facility] Video Visit from 07/04/2023 in [facility]   C-SSRS RISK CATEGORY Moderate Risk Low Risk Low Risk        Assessment and Plan: [name] is a 62 year old Caucasian female, married, disabled, lives in [place], has a history of bipolar disorder type II, GAD, multiple medical problems including obstructive sleep apnea on CPAP was evaluated by telemedicine today.  Patient is currently improving, agreeable to tapering off of the Seroquel further, plan as noted below.  Plan Bipolar disorder type I depressed in full  remission Continue risperidone 0.75 mg p.o. nightly Depakote ER 750 mg p.o. daily Wellbutrin 75 mg p.o. daily in the morning Depakote level-05/24/2023-89-therapeutic Patient advised to start weaning of Seroquel XR 50 mg, could skip the dose couple of times this week and next week and then if she does well without it could skip it more nights and then just use it as needed. Patient advised to use the hydroxyzine the nights that she does not use the Seroquel to help with sleep.    GAD-stable Hydroxyzine 25-50 mg p.o. nightly as needed Continue CBT with Dr. Jeanella Craze of Care: Collaboration of Care: Referral or follow-up with counselor/therapist AEB patient encouraged to continue CBT.  Patient/Guardian was advised Release of Information must be obtained prior to any record release in order to collaborate their care with an outside provider. Patient/Guardian was advised if they have not already done so to contact the registration department to sign all necessary forms in order for [place] to release information regarding their care.   Consent: Patient/Guardian gives verbal consent for treatment and assignment of benefits for services provided during this visit. Patient/Guardian expressed understanding and agreed to proceed.   Follow-up in clinic in 2 to 3 months or sooner if needed.  This note was generated in part or whole with voice recognition software. Voice recognition is usually quite accurate but  there are transcription errors that can and very often do occur. I apologize for any typographical errors that were not detected and corrected.    Jomarie Longs, MD 08/22/2023, 2:34 PM

## 2023-08-24 ENCOUNTER — Other Ambulatory Visit: Payer: Self-pay | Admitting: Psychiatry

## 2023-08-24 DIAGNOSIS — F3132 Bipolar disorder, current episode depressed, moderate: Secondary | ICD-10-CM

## 2023-09-05 ENCOUNTER — Telehealth: Payer: Self-pay

## 2023-09-05 DIAGNOSIS — F5101 Primary insomnia: Secondary | ICD-10-CM

## 2023-09-05 MED ORDER — HYDROXYZINE HCL 25 MG PO TABS: 25.0000 mg | ORAL_TABLET | Freq: Every evening | ORAL | 1 refills | Status: DC | PRN

## 2023-09-05 NOTE — Telephone Encounter (Signed)
pt called states that she needs a refill on the hydroxyzine. pt states she been taking 2 at night. pt was last seen on 10-29 next appt 12-31

## 2023-09-05 NOTE — Telephone Encounter (Signed)
Have sent hydroxyzine to [facility]

## 2023-09-06 NOTE — Telephone Encounter (Signed)
tried to call to notifiy patient but the phone kept ringing no answer no message could be left.

## 2023-09-13 DIAGNOSIS — F3181 Bipolar II disorder: Secondary | ICD-10-CM | POA: Diagnosis not present

## 2023-09-21 ENCOUNTER — Other Ambulatory Visit: Payer: Self-pay | Admitting: Psychiatry

## 2023-09-21 DIAGNOSIS — F3132 Bipolar disorder, current episode depressed, moderate: Secondary | ICD-10-CM

## 2023-09-21 DIAGNOSIS — F3175 Bipolar disorder, in partial remission, most recent episode depressed: Secondary | ICD-10-CM

## 2023-10-11 DIAGNOSIS — F411 Generalized anxiety disorder: Secondary | ICD-10-CM | POA: Diagnosis not present

## 2023-10-24 ENCOUNTER — Encounter: Payer: Self-pay | Admitting: Psychiatry

## 2023-10-24 ENCOUNTER — Telehealth: Payer: Medicare Other | Admitting: Psychiatry

## 2023-10-24 DIAGNOSIS — F411 Generalized anxiety disorder: Secondary | ICD-10-CM | POA: Diagnosis not present

## 2023-10-24 DIAGNOSIS — F3176 Bipolar disorder, in full remission, most recent episode depressed: Secondary | ICD-10-CM

## 2023-10-24 DIAGNOSIS — G4733 Obstructive sleep apnea (adult) (pediatric): Secondary | ICD-10-CM | POA: Diagnosis not present

## 2023-10-24 MED ORDER — DIVALPROEX SODIUM ER 500 MG PO TB24: 500.0000 mg | ORAL_TABLET | Freq: Every day | ORAL | 3 refills | Status: DC

## 2023-10-24 MED ORDER — HYDROXYZINE HCL 25 MG PO TABS: 25.0000 mg | ORAL_TABLET | Freq: Every evening | ORAL | 3 refills | Status: DC | PRN

## 2023-10-24 NOTE — Progress Notes (Signed)
 Virtual Visit via Video Note  I connected with Adamari G Herschberger on 10/24/23 at  4:00 PM EST by a video enabled telemedicine application and verified that I am speaking with the correct person using two identifiers.  Location Provider Location : [facility] Patient Location : Home  Participants: Patient , Provider   I discussed the limitations of evaluation and management by telemedicine and the availability of in person appointments. The patient expressed understanding and agreed to proceed.  I discussed the assessment and treatment plan with the patient. The patient was provided an opportunity to ask questions and all were answered. The patient agreed with the plan and demonstrated an understanding of the instructions.   The patient was advised to call back or seek an in-person evaluation if the symptoms worsen or if the condition fails to improve as anticipated.   BH MD OP Progress Note  10/24/2023 4:31 PM STEPHENIE NAVEJAS  MRN:  [id number]  Chief Complaint:  Chief Complaint  Patient presents with   Follow-up   Anxiety   Depression   Medication Refill   Manic Behavior   HPI: AURORAH SCHLACHTER is a 62 year old Caucasian female on disability, married, currently lives in [place], has a history of bipolar disorder, GAD, primary insomnia, OSA on CPAP, hyperlipidemia, osteoporosis, history of cataract surgery was evaluated by telemedicine today.  Annalea reports an improvement in her mood and sleep patterns since her last appointment in October. She attributes this improvement to her current medication regimen, which includes Risperidone  0.75mg , Depakote  750mg , Wellbutrin  75mg , and Hydroxyzine  25mg  (two tablets at night). She has completely discontinued Seroquel .  She is currently compliant on the CPAP.  Despite these improvements, Madeline is currently facing significant stress due to her husband's upcoming major surgery, which is expected to last 8-12 hours and will require a  hospital stay of 5-7 days. This has been a source of anxiety for her.  Blaine however continues to be motivated to stay in psychotherapy.  Giannah has a strong support system in place, including her mother-in-law and her two children who live in [place], close to the hospital where her husband's surgery will take place.   Patient currently denies any suicidality, homicidality or perceptual disturbances.  Denies any side effects to medications.   Visit Diagnosis:    ICD-10-CM   1. Bipolar disorder, in full remission, most recent episode depressed (HCC)  F31.76    Type I    2. GAD (generalized anxiety disorder)  F41.1 divalproex  (DEPAKOTE  ER) 500 MG 24 hr tablet    3. OSA on CPAP  G47.33 hydrOXYzine  (ATARAX ) 25 MG tablet      Past Psychiatric History: I have reviewed past psychiatric history from progress note on 09/01/2021.  Past trials of lithium -side effect, Ativan-side effect, Depakote , Trileptal-gained weight, Latuda, Effexor-made him manic, trazodone, Tranxene, Zoloft, Prozac, BuSpar-not helpful, Lamictal -restless, Seroquel , Abilify, TCA, modafinil .  Past Medical History:  Past Medical History:  Diagnosis Date   Bipolar 1 disorder (HCC)    Hyperlipidemia    OSA (obstructive sleep apnea)    CPAP dependent   Osteoporosis    PPD positive     Past Surgical History:  Procedure Laterality Date   ABDOMINAL HYSTERECTOMY     CATARACT EXTRACTION, BILATERAL     due to seroquel    ROBOTIC ASSISTED LAPAROSCOPIC HYSTERECTOMY AND SALPINGECTOMY      Family Psychiatric History: I have reviewed family psychiatric history from progress note on 09/01/2021.  Family History:  Family History  Problem Relation Age  of Onset   Personality disorder Mother    Alcohol abuse Maternal Uncle    Bipolar disorder Cousin    Schizophrenia Other    Breast cancer Neg Hx     Social History: I have reviewed social history from progress note on 09/01/2021. Social History   Socioeconomic History    Marital status: Married    Spouse name: eric   Number of children: 2   Years of education: Not on file   Highest education level: Bachelor's degree (e.g., BA, AB, BS)  Occupational History    Comment: unemployed  Tobacco Use   Smoking status: Never   Smokeless tobacco: Never  Vaping Use   Vaping status: Never Used  Substance and Sexual Activity   Alcohol use: No   Drug use: No   Sexual activity: Yes  Other Topics Concern   Not on file  Social History Narrative   Not on file   Social Drivers of Health   Financial Resource Strain: Not on file  Food Insecurity: Not on file  Transportation Needs: Not on file  Physical Activity: Not on file  Stress: Not on file  Social Connections: Unknown (03/07/2022)   Received from [facility]   Social Network    Social Network: Not on file    Allergies:  Allergies  Allergen Reactions   Tyloxapol Rash   Prilosec [Omeprazole]     Interacts with psych meds   Tylox [Oxycodone-Acetaminophen ] Rash    Metabolic Disorder Labs: No results found for: HGBA1C, MPG Lab Results  Component Value Date   PROLACTIN 5.1 09/01/2021   No results found for: CHOL, TRIG, HDL, CHOLHDL, VLDL, LDLCALC Lab Results  Component Value Date   TSH 0.955 09/01/2021    Therapeutic Level Labs: No results found for: LITHIUM  Lab Results  Component Value Date   VALPROATE 89 05/24/2023   VALPROATE 69 01/17/2023   No results found for: CBMZ  Current Medications: Current Outpatient Medications  Medication Sig Dispense Refill   alendronate (FOSAMAX) 70 MG tablet TAKE 1 TABLET BY MOUTH ONCE WEEKLY 30 MINUTES BEFORE FIRST FOOD, MEDICINE, OR WATER  OF THE DAY     buPROPion  (WELLBUTRIN ) 75 MG tablet TAKE 1 TABLET(75 MG) BY MOUTH IN THE MORNING 90 tablet 1   divalproex  (DEPAKOTE  ER) 250 MG 24 hr tablet Take 1 tablet (250 mg total) by mouth daily with supper. Take along with 500 mg daily 90 tablet 1   divalproex  (DEPAKOTE  ER) 500  MG 24 hr tablet Take 1 tablet (500 mg total) by mouth daily. Take along with 250 mg daily 90 tablet 3   hydrOXYzine  (ATARAX ) 25 MG tablet Take 1-2 tablets (25-50 mg total) by mouth at bedtime as needed. For sleep, anxiety 180 tablet 3   Multiple Vitamin (MULTIVITAMIN) tablet Take 1 tablet by mouth daily.     QUEtiapine  (SEROQUEL  XR) 50 MG TB24 24 hr tablet Take 1 tablet (50 mg total) by mouth at bedtime. 90 tablet 0   risperiDONE  (RISPERDAL ) 0.25 MG tablet TAKE 1 TABLET BY MOUTH AT BEDTIME WITH 0.5MG  TABLET 90 tablet 1   risperiDONE  (RISPERDAL ) 0.5 MG tablet TAKE 1 TABLET(0.5 MG) BY MOUTH AT BEDTIME 90 tablet 1   simvastatin (ZOCOR) 40 MG tablet Take 40 mg by mouth daily.     valACYclovir (VALTREX) 500 MG tablet 1 tablet Orally Twice a day as needed for outbreak     No current facility-administered medications for this visit.     Musculoskeletal: Strength & Muscle Tone:  UTA Gait & Station:  Seated Patient leans: N/A  Psychiatric Specialty Exam: Review of Systems  Psychiatric/Behavioral: Negative.      There were no vitals taken for this visit.There is no height or weight on file to calculate BMI.  General Appearance: Fairly Groomed  Eye Contact:  Fair  Speech:  Clear and Coherent  Volume:  Normal  Mood:  Euthymic  Affect:  Congruent  Thought Process:  Goal Directed and Descriptions of Associations: Intact  Orientation:  Full (Time, Place, and Person)  Thought Content: Logical   Suicidal Thoughts:  No  Homicidal Thoughts:  No  Memory:  Immediate;   Fair Recent;   Fair Remote;   Fair  Judgement:  Fair  Insight:  Fair  Psychomotor Activity:  Normal  Concentration:  Concentration: Fair and Attention Span: Fair  Recall:  [employer] of Knowledge: Fair  Language: Fair  Akathisia:  No  Handed:  Right  AIMS (if indicated): not done  Assets:  Desire for Improvement Housing Social Support Transportation  ADL's:  Intact  Cognition: WNL  Sleep:  Fair   Screenings: Museum/gallery Exhibitions Officer Visit from 05/15/2023 in [facility] Office Visit from 02/01/2023 in [facility] Office Visit from 01/10/2023 in [facility] Office Visit from 12/22/2022 in [facility] Office Visit from 10/26/2022 in [facility]  AIMS Total Score 0 0 0 0 0      GAD-7    Flowsheet Row Office Visit from 05/15/2023 in [facility] Office Visit from 03/13/2023 in [facility] Office Visit from 02/01/2023 in [facility] Office Visit from 01/10/2023 in [facility] Office Visit from 12/22/2022 in [facility]  Total GAD-7 Score 13 16 7 13 20       PHQ2-9    Flowsheet Row Video Visit from 07/25/2023 in [facility] Office Visit from 05/15/2023 in [facility] Office Visit from 03/13/2023 in [facility] Office Visit from 02/01/2023 in [facility] Office Visit from 01/10/2023 in [facility]  PHQ-2 Total Score 2 6 5 2 1   PHQ-9 Total Score 11 17 21 10 8       Flowsheet Row Video Visit from 10/24/2023 in [facility] Video Visit from 08/22/2023 in [facility] Video Visit from 07/25/2023 in [facility]  C-SSRS RISK CATEGORY Moderate Risk Moderate Risk Low Risk        Assessment and Plan: ROGELIO WINBUSH is a 62 year old Caucasian female, married, disabled, lives in [place], has a  history of bipolar disorder type II, GAD, multiple medical problems including obstructive sleep apnea on CPAP was evaluated by telemedicine today.  Patient with significant situational stressors although currently mood symptoms managed on current medication regimen and psychotherapy, discussed assessment and plan as noted below.   Bipolar Disorder in remission Bipolar disorder with recent mood stabilization. Currently on risperidone  0.75 mg, Depakote  750 mg, and Wellbutrin  75 mg. Reports improved sleep and mood. Emphasized medication adherence and self-care during spouse's surgery and recovery. - Continue risperidone  0.75 mg - Continue Depakote  750 mg daily - Continue Wellbutrin  75 mg - Refill Depakote   500 mg for 90 days - Depakote  level 89 -therapeutic dated 05/24/2023. - Schedule follow-up in one month  Generalized Anxiety Disorder-stable Generalized anxiety disorder managed with hydroxyzine  25 mg, two tablets at night, aiding sleep and anxiety. Reports increased stress due to spouse's upcoming surgery. Discussed support systems and self-care strategies. - Continue hydroxyzine  25 mg, two tablets at night - Refill hydroxyzine  25 mg for 90 days - Encourage use of support systems and self-care strategies - Continue therapy sessions with Dr.Nicola, next appointment in the third week of January  Obstructive Sleep Apnea Obstructive sleep apnea managed with CPAP. Reports improved sleep. - Continue CPAP therapy  Follow-up - Schedule video follow-up on February 7th at 11:00 AM.  Collaboration of Care: Collaboration of Care: Referral or follow-up with counselor/therapist AEB patient encouraged to continue CBT  Patient/Guardian was advised Release of Information must be obtained prior to any record release in order to collaborate their care with an outside provider. Patient/Guardian was advised if they have not already done so to contact the registration department to sign all necessary forms in  order for us  to release information regarding their care.   Consent: Patient/Guardian gives verbal consent for treatment and assignment of benefits for services provided during this visit. Patient/Guardian expressed understanding and agreed to proceed.   This note was generated in part or whole with voice recognition software. Voice recognition is usually quite accurate but there are transcription errors that can and very often do occur. I apologize for any typographical errors that were not detected and corrected.    Maleeyah Mccaughey, MD 10/24/2023, 4:31 PM

## 2023-10-26 ENCOUNTER — Ambulatory Visit
Admission: RE | Admit: 2023-10-26 | Discharge: 2023-10-26 | Disposition: A | Discharge status: Home / Self Care | Payer: Medicare Other | Source: Ambulatory Visit | Attending: Internal Medicine | Admitting: Internal Medicine

## 2023-10-26 ENCOUNTER — Other Ambulatory Visit: Payer: Self-pay | Admitting: Internal Medicine

## 2023-10-26 DIAGNOSIS — M7989 Other specified soft tissue disorders: Secondary | ICD-10-CM

## 2023-10-26 DIAGNOSIS — R2231 Localized swelling, mass and lump, right upper limb: Secondary | ICD-10-CM | POA: Diagnosis not present

## 2023-10-26 DIAGNOSIS — M19041 Primary osteoarthritis, right hand: Secondary | ICD-10-CM | POA: Diagnosis not present

## 2023-10-26 DIAGNOSIS — M810 Age-related osteoporosis without current pathological fracture: Secondary | ICD-10-CM | POA: Diagnosis not present

## 2023-11-20 DIAGNOSIS — F3181 Bipolar II disorder: Secondary | ICD-10-CM | POA: Diagnosis not present

## 2023-12-01 ENCOUNTER — Encounter: Payer: Self-pay | Admitting: Psychiatry

## 2023-12-01 ENCOUNTER — Telehealth (INDEPENDENT_AMBULATORY_CARE_PROVIDER_SITE_OTHER): Payer: Self-pay | Admitting: Psychiatry

## 2023-12-01 ENCOUNTER — Other Ambulatory Visit: Payer: Self-pay | Admitting: Psychiatry

## 2023-12-01 DIAGNOSIS — Z79899 Other long term (current) drug therapy: Secondary | ICD-10-CM

## 2023-12-01 DIAGNOSIS — F411 Generalized anxiety disorder: Secondary | ICD-10-CM | POA: Diagnosis not present

## 2023-12-01 DIAGNOSIS — R251 Tremor, unspecified: Secondary | ICD-10-CM

## 2023-12-01 DIAGNOSIS — G4733 Obstructive sleep apnea (adult) (pediatric): Secondary | ICD-10-CM | POA: Diagnosis not present

## 2023-12-01 DIAGNOSIS — F3130 Bipolar disorder, current episode depressed, mild or moderate severity, unspecified: Secondary | ICD-10-CM

## 2023-12-01 DIAGNOSIS — F3132 Bipolar disorder, current episode depressed, moderate: Secondary | ICD-10-CM | POA: Insufficient documentation

## 2023-12-01 MED ORDER — BENZTROPINE MESYLATE 0.5 MG PO TABS: 0.5000 mg | ORAL_TABLET | Freq: Every evening | ORAL | 1 refills | Status: DC | PRN

## 2023-12-01 MED ORDER — LITHIUM CARBONATE ER 300 MG PO TBCR: 300.0000 mg | EXTENDED_RELEASE_TABLET | Freq: Every day | ORAL | 1 refills | Status: DC

## 2023-12-01 NOTE — Patient Instructions (Signed)
 Lithium  Extended-Release Tablets What is this medication? LITHIUM  (LITH ee um) treats bipolar disorder. It works by balancing substances in your brain that help regulate mood, behaviors, and thoughts. This medicine may be used for other purposes; ask your health care provider or pharmacist if you have questions. COMMON BRAND NAME(S): Eskalith  CR, Lithobid  What should I tell my care team before I take this medication? They need to know if you have any of these conditions: Active infection Breathing problems Brugada Syndrome Dehydration (diarrhea or sweating) Diet low in salt Heart disease High levels of calcium in the blood History of irregular heartbeat Kidney disease Low level of potassium or sodium in the blood Parathyroid disease Problems urinating Thyroid  disease An unusual or allergic reaction to lithium , other medications, foods, dyes, or preservatives Pregnant or trying to get pregnant Breast-feeding How should I use this medication? Take this medication by mouth with a glass of water . Follow the directions on the prescription label. Swallow the tablets whole. Do not break, crush or chew. Take after a meal or snack to avoid stomach upset. Take your doses at regular intervals. Do not take your medication more often than directed. The amount of this medication you take is very important. Taking more than the prescribed dose can cause serious side effects. Do not stop taking except on the advice of your care team. Talk to your care team about the use of this medication in children. Special care may be needed. While this medication may be prescribed for children as young as 12 years for selected conditions, precautions do apply. Overdosage: If you think you have taken too much of this medicine contact a poison control center or emergency room at once. NOTE: This medicine is only for you. Do not share this medicine with others. What if I miss a dose? If you miss a dose, take it as soon  as you can. If it is almost time for your next dose, take only that dose. Do not take double or extra doses. What may interact with this medication? Do not take this medication with any of the following: Cisapride Dronedarone Pimozide Thioridazine This medication may also interact with the following: Caffeine Carbamazepine Certain medications for depression, anxiety, or other mental health conditions Certain medications for high blood pressure Certain medications for migraine headache, such as almotriptan, eletriptan, frovatriptan, naratriptan, rizatriptan, sumatriptan, zolmitriptan Diuretics Fentanyl  Linezolid MAOIs, such as Carbex, Eldepryl, Marplan, Nardil, and Parnate Medications that relax muscles for surgery Methyldopa Metronidazole NSAIDs, medications for pain and inflammation, such as ibuprofen  or naproxen Other medications that cause heart rhythm changes, such as dofetilide Phenytoin Potassium iodide SGLT2 inhibitors, such as canagliflozin, dapagliflozin, empagliflozin, and ertugliflozin Sodium bicarbonate Sodium chloride  St. John's Wort Theophylline Tramadol  Tryptophan Urea  This list may not describe all possible interactions. Give your health care provider a list of all the medicines, herbs, non-prescription drugs, or dietary supplements you use. Also tell them if you smoke, drink alcohol, or use illegal drugs. Some items may interact with your medicine. What should I watch for while using this medication? Visit your care team for regular checks on your progress. It can take several weeks of treatment before you start to get better. The amount of salt (sodium) in your body influences the effects of this medication, and this medication can increase salt loss from the body. Eat a normal diet that includes salt. Do not change to salt substitutes. Avoid changes involving diet, or medications that include large amounts of sodium like sodium bicarbonate. Ask your  care team for  advice if you are not sure. Drink plenty of fluids while you are taking this medication. Avoid drinks that contain caffeine, such as coffee, tea and colas. You will need extra fluids if you have diarrhea or sweat a lot. This will help prevent toxic effects from this medication. Be careful not to get overheated during exercise, saunas, hot baths, and hot weather. Consult your care team if you have a high fever or persistent diarrhea. This medication may affect your coordination, reaction time, or judgment. Do not drive or operate machinery until you know how this medication affects you. Sit up or stand slowly to reduce the risk of dizzy or fainting spells. Drinking alcohol with this medication can increase the risk of these side effects. What side effects may I notice from receiving this medication? Side effects that you should report to your care team as soon as possible: Allergic reactions--skin rash, itching, hives, swelling of the face, lips, tongue, or throat Heart rhythm changes--fast or irregular heartbeat, dizziness, feeling faint or lightheaded, chest pain, trouble breathing Increased pressure around the brain--severe headache, blurry vision, change in vision, nausea, vomiting Increased thirst and amount of urine Irritability, confusion, fast or irregular heartbeat, muscle stiffness, twitching muscles, sweating, high fever, seizure, chills, vomiting, diarrhea, which may be signs of serotonin syndrome Lithium  toxicity--diarrhea, vomiting, tremors, loss of balance or coordination, uncontrollable eye movement, ringing of the ears, muscle weakness, twitching muscles, slurred speech, confusion Side effects that usually do not require medical attention (report to your care team if they continue or are bothersome): Dizziness Fatigue Nausea Tremors or shaking This list may not describe all possible side effects. Call your doctor for medical advice about side effects. You may report side effects to  FDA at 1-800-FDA-1088. Where should I keep my medication? Keep out of the reach of children. Store at room temperature between 15 and 30 degrees C (59 and 86 degrees F). Keep container tightly closed. Protect from light. Throw away any unused medication after the expiration date. NOTE: This sheet is a summary. It may not cover all possible information. If you have questions about this medicine, talk to your doctor, pharmacist, or health care provider.  2024 Elsevier/Gold Standard (2021-09-10 00:00:00)Benztropine  Tablets What is this medication? BENZTROPINE  (BENZ troe peen) treats movement disorders, including those caused by Parkinson disease and some medications. It works by balancing substances in your brain that help manage body movements and coordination. This reduces symptoms, such as body stiffness and tremors. This medicine may be used for other purposes; ask your health care provider or pharmacist if you have questions. COMMON BRAND NAME(S): Cogentin  What should I tell my care team before I take this medication? They need to know if you have any of these conditions: Glaucoma Heart disease or a rapid heartbeat Mental health condition Prostate trouble Tardive dyskinesia An unusual or allergic reaction to benztropine , lactose, other medications, foods, dyes, or preservatives Pregnant or trying to get pregnant Breast-feeding How should I use this medication? Take this medication by mouth with a full glass of water . Take it as directed on the prescription label. Keep taking it unless your care team tells you to stop. Talk to your care team about the use of this medication in children. While it may be prescribed for children as young as 3 years for selected conditions, precautions do apply. Overdosage: If you think you have taken too much of this medicine contact a poison control center or emergency room at once. NOTE: This medicine  is only for you. Do not share this medicine with  others. What if I miss a dose? If you miss a dose, take it as soon as you can. If it is almost time for your next dose, take only that dose. Do not take double or extra doses. What may interact with this medication? Haloperidol Medications for movement abnormalities, such as Parkinson disease Phenothiazines, such as chlorpromazine, mesoridazine, prochlorperazine , thioridazine Some antidepressants, such as amitriptyline, desipramine, doxepin, nortriptyline Stimulant medications for ADHD, weight loss, or staying awake Tegaserod This list may not describe all possible interactions. Give your health care provider a list of all the medicines, herbs, non-prescription drugs, or dietary supplements you use. Also tell them if you smoke, drink alcohol, or use illegal drugs. Some items may interact with your medicine. What should I watch for while using this medication? Visit your care team for regular checks on your progress. Tell your care team if your symptoms do not start to get better or if they get worse. This medication may affect your coordination, reaction time, or judgement. Do not drive or operate machinery until you know how this medication affects you. Sit up or stand slowly to reduce the risk of dizzy or fainting spells. Drinking alcohol with this medication can increase the risk of these side effects. Your mouth may get dry. Chewing sugarless gum or sucking hard candy and drinking plenty of water  may help. Contact your care team if the problem does not go away or is severe. This medication may cause dry eyes and blurred vision. If you wear contact lenses, you may feel some discomfort. Lubricating eye drops may help. See your care team if the problem does not go away or is severe. Avoid extreme heat. This medication can cause you to sweat less than normal. Your body temperature could increase to dangerous levels, which may lead to heat stroke. What side effects may I notice from receiving this  medication? Side effects that you should report to your care team as soon as possible: Allergic reactions--skin rash, itching, hives, swelling of the face, lips, tongue, or throat Anticholinergic toxicity--flushed face, blurry vision, dry mouth and skin, confusion, fast or irregular heartbeat, trouble passing urine, constipation Fever that does not go away, decreased sweating Hallucinations Sudden eye pain or change in vision such as blurry vision, seeing halos around lights, vision loss Side effects that usually do not require medical attention (report to your care team if they continue or are bothersome): Nausea Vomiting This list may not describe all possible side effects. Call your doctor for medical advice about side effects. You may report side effects to FDA at 1-800-FDA-1088. Where should I keep my medication? Keep out of the reach of children and pets. Store at room temperature between 20 and 25 degrees C (68 and 77 degrees F). Get rid of any unused medication after the expiration date. To get rid of medications that are no longer needed or have expired: Take the medication to a take-back program. Check with your pharmacy or law enforcement to find a location. If you cannot return the medication, check the label or package insert to see if the medication should be thrown out in the garbage or flushed down the toilet. If you are not sure, ask your care team. If it is safe to put it in the trash, empty the medication out of the container. Mix the medication with cat litter, dirt, coffee grounds, or other unwanted substance. Seal the mixture in a bag or container.  Put it in the trash. NOTE: This sheet is a summary. It may not cover all possible information. If you have questions about this medicine, talk to your doctor, pharmacist, or health care provider.  2024 Elsevier/Gold Standard (2022-02-07 00:00:00)

## 2023-12-01 NOTE — Progress Notes (Signed)
 Virtual Visit via Video Note  I connected with [name] on 12/01/23 at 11:00 AM EST by a video enabled telemedicine application and verified that I am speaking with the correct person using two identifiers.  Location Provider Location : [facility] Patient Location : Home  Participants: Patient , Provider   I discussed the limitations of evaluation and management by telemedicine and the availability of in person appointments. The patient expressed understanding and agreed to proceed.    I discussed the assessment and treatment plan with the patient. The patient was provided an opportunity to ask questions and all were answered. The patient agreed with the plan and demonstrated an understanding of the instructions.   The patient was advised to call back or seek an in-person evaluation if the symptoms worsen or if the condition fails to improve as anticipated.   BH MD OP Progress Note  12/01/2023 12:16 PM [name]  MRN:  [id number]  Chief Complaint:  Chief Complaint  Patient presents with   Medication Refill   Follow-up   Depression   Anxiety   HPI: [name] is a 63 year old Caucasian female on disability, married, currently lives in [place], has a history of bipolar disorder, GAD, primary insomnia, OSA on CPAP, hyperlipidemia, osteoporosis, history of cataract surgery was evaluated by telemedicine today.  She has experienced increased fatigue and sadness since the past weekend, feeling more tired, desiring more sleep, and lacking energy for activities. She reports more good nights of sleep than bad in the past two weeks, with only two nights of about four hours of sleep. She confirms regular use of her CPAP machine and denies stopping its use.in spite of that she feels tired throughout the day.  Despite this, she attempts to engage in activities. No anxiety is present, which was previously an issue. She denies any suicidal thoughts.  She is currently  taking risperidone  0.75 mg, Depakote , Wellbutrin , and hydroxyzine  as needed for sleep. She uses one hydroxyzine  at night due to difficulty falling asleep. She has discontinued Seroquel  for several months. Regular use of a CPAP machine is confirmed effective through app tracking.  She has noticed worsening hand tremors affecting her ability to eat with a spoon, drink from a cup, and write. She braces her right hand with her left to write and uses a travel mug for drinking. There is a family history of essential tremors, with her mother having had them severely.  She currently denies any suicidality, homicidality or perceptual disturbances.    Visit Diagnosis:    ICD-10-CM   1. Bipolar 1 disorder, depressed, moderate (HCC)  F31.32 lithium  carbonate (LITHOBID ) 300 MG ER tablet    Lithium  level    BUN    Creatinine, serum    2. GAD (generalized anxiety disorder)  F41.1     3. OSA on CPAP  G47.33     4. Tremor  R25.1 benztropine  (COGENTIN ) 0.5 MG tablet   Unspecified likely secondary to psychotropics versus rule out essential tremor    5. High risk medication use  Z79.899 Lithium  level    BUN    TSH      Past Psychiatric History: I have reviewed past psychiatric history from progress note on 09/01/2021.  Past trials of lithium -side effect of nausea, Ativan-side effect, Depakote , Trileptal-gained weight, Latuda-akathisia, Effexor-made her manic, trazodone, Tranxene, Zoloft, Prozac, BuSpar-not help, Lamictal -restless, Seroquel , Abilify, TCA, modafinil   Past Medical History:  Past Medical History:  Diagnosis Date   Bipolar 1 disorder (HCC)  Hyperlipidemia    OSA (obstructive sleep apnea)    CPAP dependent   Osteoporosis    PPD positive     Past Surgical History:  Procedure Laterality Date   ABDOMINAL HYSTERECTOMY     CATARACT EXTRACTION, BILATERAL     due to seroquel    ROBOTIC ASSISTED LAPAROSCOPIC HYSTERECTOMY AND SALPINGECTOMY      Family Psychiatric History: I have  reviewed family psychiatric history from progress note on 09/01/2021.  Family History:  Family History  Problem Relation Age of Onset   Personality disorder Mother    Alcohol abuse Maternal Uncle    Bipolar disorder Cousin    Schizophrenia Other    Breast cancer Neg Hx     Social History: I have reviewed social history from progress note on 09/01/2021. Social History   Socioeconomic History   Marital status: Married    Spouse name: eric   Number of children: 2   Years of education: Not on file   Highest education level: Bachelor's degree (e.g., BA, AB, BS)  Occupational History    Comment: unemployed  Tobacco Use   Smoking status: Never   Smokeless tobacco: Never  Vaping Use   Vaping status: Never Used  Substance and Sexual Activity   Alcohol use: No   Drug use: No   Sexual activity: Yes  Other Topics Concern   Not on file  Social History Narrative   Not on file   Social Drivers of Health   Financial Resource Strain: Not on file  Food Insecurity: Not on file  Transportation Needs: Not on file  Physical Activity: Not on file  Stress: Not on file  Social Connections: Unknown (03/07/2022)   Received from [facility]   Social Network    Social Network: Not on file    Allergies:  Allergies  Allergen Reactions   Tyloxapol Rash   Prilosec [Omeprazole]     Interacts with psych meds   Tylox [Oxycodone-Acetaminophen ] Rash    Metabolic Disorder Labs: No results found for: HGBA1C, MPG Lab Results  Component Value Date   PROLACTIN 5.1 09/01/2021   No results found for: CHOL, TRIG, HDL, CHOLHDL, VLDL, LDLCALC Lab Results  Component Value Date   TSH 0.955 09/01/2021    Therapeutic Level Labs: No results found for: LITHIUM  Lab Results  Component Value Date   VALPROATE 89 05/24/2023   VALPROATE 69 01/17/2023   No results found for: CBMZ  Current Medications: Current Outpatient Medications  Medication Sig Dispense  Refill   alendronate (FOSAMAX) 70 MG tablet TAKE 1 TABLET BY MOUTH ONCE WEEKLY 30 MINUTES BEFORE FIRST FOOD, MEDICINE, OR WATER  OF THE DAY     benztropine  (COGENTIN ) 0.5 MG tablet Take 1 tablet (0.5 mg total) by mouth at bedtime as needed for tremors. 30 tablet 1   buPROPion  (WELLBUTRIN ) 75 MG tablet TAKE 1 TABLET(75 MG) BY MOUTH IN THE MORNING 90 tablet 1   divalproex  (DEPAKOTE  ER) 250 MG 24 hr tablet Take 1 tablet (250 mg total) by mouth daily with supper. Take along with 500 mg daily 90 tablet 1   divalproex  (DEPAKOTE  ER) 500 MG 24 hr tablet Take 1 tablet (500 mg total) by mouth daily. Take along with 250 mg daily 90 tablet 3   hydrOXYzine  (ATARAX ) 25 MG tablet Take 1-2 tablets (25-50 mg total) by mouth at bedtime as needed. For sleep, anxiety 180 tablet 3   lithium  carbonate (LITHOBID ) 300 MG ER tablet Take 1 tablet (300 mg total) by mouth  daily after lunch. 30 tablet 1   Multiple Vitamin (MULTIVITAMIN) tablet Take 1 tablet by mouth daily.     risperiDONE  (RISPERDAL ) 0.25 MG tablet TAKE 1 TABLET BY MOUTH AT BEDTIME WITH 0.5MG  TABLET 90 tablet 1   risperiDONE  (RISPERDAL ) 0.5 MG tablet TAKE 1 TABLET(0.5 MG) BY MOUTH AT BEDTIME 90 tablet 1   simvastatin (ZOCOR) 40 MG tablet Take 40 mg by mouth daily.     valACYclovir (VALTREX) 500 MG tablet 1 tablet Orally Twice a day as needed for outbreak     No current facility-administered medications for this visit.     Musculoskeletal: Strength & Muscle Tone:  UTA Gait & Station:  Seated Patient leans: N/A  Psychiatric Specialty Exam: Review of Systems  Neurological:  Positive for tremors.  Psychiatric/Behavioral:  Positive for decreased concentration and dysphoric mood.     There were no vitals taken for this visit.There is no height or weight on file to calculate BMI.  General Appearance: Fairly Groomed  Eye Contact:  Fair  Speech:  Normal Rate  Volume:  Normal  Mood:  Depressed  Affect:  Depressed  Thought Process:  Goal Directed and  Descriptions of Associations: Intact  Orientation:  Full (Time, Place, and Person)  Thought Content: Logical   Suicidal Thoughts:  No  Homicidal Thoughts:  No  Memory:  Immediate;   Fair Recent;   Fair Remote;   Fair  Judgement:  Fair  Insight:  Fair  Psychomotor Activity:  Tremor reports tremors of bilateral hands as well as possible postural tremors  Concentration:  Concentration: Fair and Attention Span: Fair  Recall:  [employer] of Knowledge: Fair  Language: Fair  Akathisia:  No  Handed:  Right  AIMS (if indicated): not done  Assets:  Desire for Improvement Housing Social Support  ADL's:  Intact  Cognition: WNL  Sleep:   Excessive   Screenings: Geneticist, Molecular Office Visit from 05/15/2023 in [facility] Office Visit from 02/01/2023 in [facility] Office Visit from 01/10/2023 in [facility] Office Visit from 12/22/2022 in [facility] Office Visit from 10/26/2022 in [facility]  AIMS Total Score 0 0 0 0 0      GAD-7    Flowsheet Row Office Visit from 05/15/2023 in [facility] Office Visit from 03/13/2023 in [facility] Office Visit from 02/01/2023 in [facility] Office Visit from 01/10/2023 in [facility] Office Visit from 12/22/2022 in [facility]  Total GAD-7 Score 13 16 7 13 20       PHQ2-9    Flowsheet Row Video Visit from 07/25/2023 in [facility] Office Visit from 05/15/2023 in [facility] Office Visit from 03/13/2023 in [facility] Office Visit from  02/01/2023 in [facility] Office Visit from 01/10/2023 in [facility]  PHQ-2 Total Score 2 6 5 2 1   PHQ-9 Total Score 11 17 21 10 8       Flowsheet Row Video Visit from 12/01/2023 in [facility] Video Visit from 10/24/2023 in [facility] Video Visit from 08/22/2023 in [facility]  C-SSRS RISK CATEGORY Moderate  Risk Moderate Risk Moderate Risk        Assessment and Plan: [name] is a 63 year old Caucasian female, married, disabled, lives in [place], currently struggling with worsening depression symptoms tremors, discussed assessment and plan as noted below.  Bipolar Depression-unstable Reports increased fatigue, sadness, and low energy since the past weekend. Sleep varies from 4 to 11 hours, with more good nights than bad. No suicidal ideation. Considering increasing risperidone  but concerned about exacerbating tremors. Discussed potential use of lithium  as adjunct therapy, including risks (kidney and thyroid  function monitoring, nausea, tremors). Possibility of reducing other medications if lithium  is effective. Advised to monitor for serotonin syndrome and other side effects. - Start Lithium  300 mg extended release daily. - Continue Depakote  750 mg daily ( 05/24/23- 89 -therapeutic) - Continue Wellbutrin  75 mg daily - Continue Risperidone  0.75 mg at bedtime - Advise to stay well hydrated - Monitor for serotonin syndrome and other side effects  Generalized anxiety disorder-stable Currently denies any significant anxiety symptoms.  Reports medications as well as psychotherapy sessions as beneficial for managing anxiety symptoms. - Continue Hydroxyzine  25 mg, 2 tablets at night. - Continue CBT with Dr. Guerry  Tremors-unspecified Reports hand tremors affecting daily  activities. Family history of essential tremors. Tremors may be a side effect of risperidone  or Depakote . Discussed potential use of benztropine  (Cogentin ) to manage tremors. Reports voice cracking, which may be related. Informed about risks of benztropine  (constipation, dry mouth, memory problems). - Start Benztropine  (Cogentin ) 0.5 mg daily as needed for tremors - Advise to take Benztropine  daily for the next couple of weeks to assess effectiveness - Refer to primary care physician for further evaluation and possible neurology referral  Obstructive sleep apnea-stable Currently reports good compliance with CPAP. - Encouraged to continue CPAP therapy.  High risk medication use - Order lithium  level, BUN, creatinine, and TSH tests-patient to get this done in 5 days after starting lithium .  Collaboration of Care: Collaboration of Care: Referral or follow-up with counselor/therapist AEB continue CBT with therapist.  Patient/Guardian was advised Release of Information must be obtained prior to any record release in order to collaborate their care with an outside provider. Patient/Guardian was advised if they have not already done so to contact the registration department to sign all necessary forms in order for us  to release information regarding their care.   Consent: Patient/Guardian gives verbal consent for treatment and assignment of benefits for services provided during this visit. Patient/Guardian expressed understanding and agreed to proceed.  This note was generated in part or whole with voice recognition software. Voice recognition is usually quite accurate but there are transcription errors that can and very often do occur. I apologize for any typographical errors that were not detected and corrected.    Olusegun Gerstenberger, MD 12/01/2023, 12:16 PM

## 2023-12-06 ENCOUNTER — Other Ambulatory Visit
Admission: RE | Admit: 2023-12-06 | Discharge: 2023-12-06 | Disposition: A | Discharge status: Home / Self Care | Payer: Medicare Other | Source: Ambulatory Visit | Attending: Psychiatry | Admitting: Psychiatry

## 2023-12-06 DIAGNOSIS — F3132 Bipolar disorder, current episode depressed, moderate: Secondary | ICD-10-CM | POA: Diagnosis not present

## 2023-12-06 DIAGNOSIS — Z79899 Other long term (current) drug therapy: Secondary | ICD-10-CM | POA: Diagnosis not present

## 2023-12-06 LAB — LITHIUM LEVEL: Lithium Lvl: 0.3 mmol/L — ABNORMAL LOW (ref 0.60–1.20)

## 2023-12-06 LAB — BUN: BUN: 16 mg/dL (ref 8–23)

## 2023-12-06 LAB — CREATININE, SERUM
Creatinine, Ser: 0.98 mg/dL (ref 0.44–1.00)
GFR, Estimated: >60 mL/min (ref >60)

## 2023-12-06 LAB — TSH: TSH: 3.652 u[IU]/mL (ref 0.350–4.500)

## 2023-12-08 ENCOUNTER — Telehealth: Payer: Self-pay | Admitting: Psychiatry

## 2023-12-08 NOTE — Telephone Encounter (Signed)
Contacted patient to discuss lithium level subtherapeutic at 0.3.  Patient had adverse effect to higher dosage of lithium in the past.  Patient would like to stay on this dosage for now.  She has upcoming appointment on February 19, will discuss further options medication wise at that visit.

## 2023-12-13 ENCOUNTER — Encounter: Payer: Self-pay | Admitting: Psychiatry

## 2023-12-13 ENCOUNTER — Telehealth (INDEPENDENT_AMBULATORY_CARE_PROVIDER_SITE_OTHER): Payer: Self-pay | Admitting: Psychiatry

## 2023-12-13 DIAGNOSIS — F3132 Bipolar disorder, current episode depressed, moderate: Secondary | ICD-10-CM

## 2023-12-13 DIAGNOSIS — F411 Generalized anxiety disorder: Secondary | ICD-10-CM | POA: Diagnosis not present

## 2023-12-13 DIAGNOSIS — R251 Tremor, unspecified: Secondary | ICD-10-CM

## 2023-12-13 DIAGNOSIS — G4733 Obstructive sleep apnea (adult) (pediatric): Secondary | ICD-10-CM

## 2023-12-13 DIAGNOSIS — Z9189 Other specified personal risk factors, not elsewhere classified: Secondary | ICD-10-CM

## 2023-12-13 MED ORDER — RISPERIDONE 0.25 MG PO TABS: 0.2500 mg | ORAL_TABLET | ORAL | Status: DC

## 2023-12-13 NOTE — Progress Notes (Unsigned)
 Virtual Visit via Video Note  I connected with [name] on 12/13/23 at 11:20 AM EST by a video enabled telemedicine application and verified that I am speaking with the correct person using two identifiers.  Location Provider Location : [facility] Patient Location : Home  Participants: Patient , Provider    I discussed the limitations of evaluation and management by telemedicine and the availability of in person appointments. The patient expressed understanding and agreed to proceed.   I discussed the assessment and treatment plan with the patient. The patient was provided an opportunity to ask questions and all were answered. The patient agreed with the plan and demonstrated an understanding of the instructions.   The patient was advised to call back or seek an in-person evaluation if the symptoms worsen or if the condition fails to improve as anticipated.   BH MD OP Progress Note  12/14/2023 8:28 AM [name]  MRN:  [id number]  Chief Complaint:  Chief Complaint  Patient presents with   Medication Refill   Follow-up   Depression   Anxiety   HPI: [name] is a 63 year old Caucasian female on disability, married, currently lives in [place], has a history of bipolar disorder, GAD, primary insomnia, OSA on CPAP, hyperlipidemia, osteoporosis, history of cataract surgery was evaluated by telemedicine today.  The patient, with bipolar disorder, presents with ongoing fatigue, tremors and sadness.  She experiences ongoing fatigue and sadness, which have slightly improved since the last appointment on February 7th. Despite current medication management, these symptoms persist.  Denies suicidal thoughts or self-injurious behaviors.  She experiences persistent tremors. She is currently taking risperidone and lithium.  Lithium was just initiated since she continued to have worsening depression at her last visit on December 01, 2023.  She is currently tolerating  it well.  She has been using benztropine for the tremors likely secondary to risperidone but feels it is not effective.  She is currently taking lithium at a low dosage, which was noted to be subtherapeutic on a recent lab test. She is also on depakote 250 mg and is compliant on it.  Her sleep pattern includes sleeping 8 to 10 hours per night, which she feels is excessive. She uses a CPAP machine, which she finds helpful, and denies taking naps during the day.  She is trying to motivate herself and has been able to do her household chores.  She reports she continues to follow up with her therapist and therapy sessions are beneficial.  She denies any homicidality or perceptual disturbances.    Visit Diagnosis:    ICD-10-CM   1. Bipolar 1 disorder, depressed, moderate (HCC)  F31.32 risperiDONE (RISPERDAL) 0.25 MG tablet   Type I    2. GAD (generalized anxiety disorder)  F41.1     3. OSA on CPAP  G47.33     4. Tremor  R25.1    Unspecified likely secondary to psychotropics versus essential tremors.    5. At risk for prolonged QT interval syndrome  Z91.89 EKG 12-Lead      Past Psychiatric History: I have reviewed past psychiatric history from progress note on 09/01/2021.  Past trials of lithium-side effect of nausea, Ativan-side effect, Depakote, Trileptal-gained weight, Latuda-akathisia, Effexor-made her manic, trazodone, Tranxene, Zoloft, Prozac, BuSpar-did not help, Lamictal-restless, Seroquel, Abilify, TCA, modafinil.  Past Medical History:  Past Medical History:  Diagnosis Date   Bipolar 1 disorder (HCC)    Hyperlipidemia    OSA (obstructive sleep apnea)    CPAP  dependent   Osteoporosis    PPD positive     Past Surgical History:  Procedure Laterality Date   ABDOMINAL HYSTERECTOMY     CATARACT EXTRACTION, BILATERAL     due to seroquel   ROBOTIC ASSISTED LAPAROSCOPIC HYSTERECTOMY AND SALPINGECTOMY      Family Psychiatric History: I have reviewed family psychiatric  history from progress note on 09/01/2021.  Family History:  Family History  Problem Relation Age of Onset   Personality disorder Mother    Alcohol abuse Maternal Uncle    Bipolar disorder Cousin    Schizophrenia Other    Breast cancer Neg Hx     Social History: I have reviewed social history from progress note on 09/01/2021. Social History   Socioeconomic History   Marital status: Married    Spouse name: eric   Number of children: 2   Years of education: Not on file   Highest education level: Bachelor's degree (e.g., BA, AB, BS)  Occupational History    Comment: unemployed  Tobacco Use   Smoking status: Never   Smokeless tobacco: Never  Vaping Use   Vaping status: Never Used  Substance and Sexual Activity   Alcohol use: No   Drug use: No   Sexual activity: Yes  Other Topics Concern   Not on file  Social History Narrative   Not on file   Social Drivers of Health   Financial Resource Strain: Not on file  Food Insecurity: Not on file  Transportation Needs: Not on file  Physical Activity: Not on file  Stress: Not on file  Social Connections: Unknown (03/07/2022)   Received from [facility], [facility]   Social Network    Social Network: Not on file    Allergies:  Allergies  Allergen Reactions   Tyloxapol Rash   Prilosec [Omeprazole]     Interacts with psych meds   Tylox [Oxycodone-Acetaminophen] Rash    Metabolic Disorder Labs: No results found for: "HGBA1C", "MPG" Lab Results  Component Value Date   PROLACTIN 5.1 09/01/2021   No results found for: "CHOL", "TRIG", "HDL", "CHOLHDL", "VLDL", "LDLCALC" Lab Results  Component Value Date   TSH 3.652 12/06/2023   TSH 0.955 09/01/2021    Therapeutic Level Labs: Lab Results  Component Value Date   LITHIUM 0.30 (L) 12/06/2023   Lab Results  Component Value Date   VALPROATE 89 05/24/2023   VALPROATE 69 01/17/2023   No results found for: "CBMZ"  Current Medications: Current Outpatient  Medications  Medication Sig Dispense Refill   alendronate (FOSAMAX) 70 MG tablet TAKE 1 TABLET BY MOUTH ONCE WEEKLY 30 MINUTES BEFORE FIRST FOOD, MEDICINE, OR WATER OF THE DAY     benztropine (COGENTIN) 0.5 MG tablet TAKE 1 TABLET(0.5 MG) BY MOUTH AT BEDTIME AS NEEDED FOR TREMORS 90 tablet 0   buPROPion (WELLBUTRIN) 75 MG tablet TAKE 1 TABLET(75 MG) BY MOUTH IN THE MORNING 90 tablet 1   divalproex (DEPAKOTE ER) 250 MG 24 hr tablet Take 1 tablet (250 mg total) by mouth daily with supper. Take along with 500 mg daily 90 tablet 1   divalproex (DEPAKOTE ER) 500 MG 24 hr tablet Take 1 tablet (500 mg total) by mouth daily. Take along with 250 mg daily 90 tablet 3   hydrOXYzine (ATARAX) 25 MG tablet Take 1-2 tablets (25-50 mg total) by mouth at bedtime as needed. For sleep, anxiety 180 tablet 3   lithium carbonate (LITHOBID) 300 MG ER tablet Take 1 tablet (300 mg total) by mouth  daily after lunch. 30 tablet 1   Multiple Vitamin (MULTIVITAMIN) tablet Take 1 tablet by mouth daily.     risperiDONE (RISPERDAL) 0.25 MG tablet Take 1 tablet (0.25 mg total) by mouth as directed. STOP 0.5 MG , Take 0.25 mg daily for 3 days and then every other day for another 3 doses and stop taking it.     simvastatin (ZOCOR) 40 MG tablet Take 40 mg by mouth daily.     valACYclovir (VALTREX) 500 MG tablet 1 tablet Orally Twice a day as needed for outbreak     No current facility-administered medications for this visit.     Musculoskeletal: Strength & Muscle Tone:  UTA Gait & Station:  Seated Patient leans: N/A  Psychiatric Specialty Exam: Review of Systems  Neurological:  Positive for tremors.  Psychiatric/Behavioral:  Positive for dysphoric mood and sleep disturbance.     There were no vitals taken for this visit.There is no height or weight on file to calculate BMI.  General Appearance: Casual  Eye Contact:  Fair  Speech:  Clear and Coherent  Volume:  Normal  Mood:  Depressed  Affect:  Congruent  Thought  Process:  Goal Directed and Descriptions of Associations: Intact  Orientation:  Full (Time, Place, and Person)  Thought Content: Logical   Suicidal Thoughts:  No  Homicidal Thoughts:  No  Memory:  Immediate;   Fair Recent;   Fair Remote;   Fair  Judgement:  Fair  Insight:  Fair  Psychomotor Activity:  Tremor  Concentration:  Concentration: Fair and Attention Span: Fair  Recall:  [employer] of Knowledge: Fair  Language: Fair  Akathisia:  No  Handed:  Right  AIMS (if indicated): not done  Assets:  Desire for Improvement Housing Social Support  ADL's:  Intact  Cognition: WNL  Sleep:   Engineer, petroleum   Screenings: Geneticist, molecular Office Visit from 05/15/2023 in [facility] Office Visit from 02/01/2023 in [facility] Visit from 01/10/2023 in [facility] Visit from 12/22/2022 in [facility] Office Visit from 10/26/2022 in [facility]  AIMS Total Score 0 0 0 0 0      GAD-7    Flowsheet Row Office Visit from 05/15/2023 in [facility] Office Visit from 03/13/2023 in [facility] Office Visit from 02/01/2023 in [facility] Office Visit from 01/10/2023 in [facility] Office Visit from 12/22/2022 in [facility]  Total GAD-7 Score 13 16 7 13 20       PHQ2-9    Flowsheet Row Video Visit from 07/25/2023 in [facility] Office Visit from 05/15/2023 in [facility] Office Visit from 03/13/2023 in [facility] Office Visit from 02/01/2023 in [facility] Office Visit from 01/10/2023 in [facility]  PHQ-2 Total Score 2 6 5 2 1   PHQ-9 Total Score 11 17 21 10 8       Flowsheet Row Video Visit from 12/13/2023 in [facility] Video Visit from 12/01/2023 in [facility] Video Visit from 10/24/2023 in [facility]  C-SSRS RISK CATEGORY Moderate Risk Moderate Risk Moderate Risk  Assessment and Plan: [name] is a 63 year old Caucasian female, married, disabled, lives in [place], has a history of bipolar depression, anxiety disorder, tremors unspecified, obstructive sleep apnea presented for a follow-up appointment.  Discussed assessment and plan as noted below.  Bipolar Disorder-unstable Reports ongoing fatigue and sadness with slight improvement. Tremors likely due to medication interaction. No suicidal ideation. Sleep is excessive but well-managed with CPAP. Appetite is normal. Current medications include lithium and risperidone. Discussed risks of medication interactions affecting the heart, including potential for sudden cardiac events. Informed about the need for EKG before increasing lithium dosage. Lithium is beneficial for treatment-resistant depression and may allow reduction of other medications. - Wean off risperidone: stop 0.5 mg immediately, take 0.25 mg daily for 3 days, then every other day for 3 doses, then stop. - Order EKG in 10 days. - Increase Lithium dosage after EKG review. - Continue Lithium extended release 300 mg daily - Continue Depakote 750 mg daily ( 05/24/23-89-therapeutic) - Continue Wellbutrin 75 mg daily - Continue follow-up with therapist, Dr. Lang Snow.  Tremors-unstable Tremors likely due to interaction between risperidone and lithium. Benztropine (Cogentin) has been ineffective. Reducing risperidone may  alleviate tremors. If tremors persist, referral to neurologist will be considered. - Wean off Risperidone as detailed above. - Continue Benztropine 0.5 mg daily as needed. - If tremors persist, consider referral to neurologist.  Generalized anxiety disorder-stable Currently denies any significant anxiety symptoms. - Continue Hydroxyzine 25 mg 2 tablets at night - Continue CBT with Dr. Lang Snow  Obstructive sleep apnea-stable Currently reports good compliance with CPAP. - Continue to encourage CPAP therapy.  High risk medication use reviewed and discussed lithium level dated 12/06/2023-0.3-subtherapeutic, BUN and creatinine within normal limits, TSH - within normal limits  At risk for prolonged QT syndrome-I have ordered EKG.  Patient to call [phone].  Patient to get EKG completed in the next 10 days.  Once EKG reviewed we will consider increasing the dosage of lithium.  Aware of drug-drug interaction between lithium, risperidone as well as the cardiac effect of these 2 medications combined.  Follow-up - Follow-up appointment on March 13th at 3:20 PM by video. - Call EKG department to schedule an appointment in 10 days.  Collaboration of Care: Collaboration of Care: Referral or follow-up with counselor/therapist AEB patient encouraged to continue CBT  Patient/Guardian was advised Release of Information must be obtained prior to any record release in order to collaborate their care with an outside provider. Patient/Guardian was advised if they have not already done so to contact the registration department to sign all necessary forms in order for [place] to release information regarding their care.   Consent: Patient/Guardian gives verbal consent for treatment and assignment of benefits for services provided during this visit. Patient/Guardian expressed understanding and agreed to proceed.   This note was generated in part or whole with voice recognition software. Voice recognition is usually  quite accurate but there are transcription errors that can and very often do occur. I apologize for any typographical errors that were not detected and corrected.    Jomarie Longs, MD 12/14/2023, 8:28 AM

## 2023-12-13 NOTE — Patient Instructions (Signed)
 STOP Risperidone 0.5 MG , Take 0.25 mg daily for 3 days and then every other day for another 3 doses and stop taking it.  Please call for EKG - [phone]

## 2023-12-14 NOTE — Telephone Encounter (Signed)
Will defer this to you

## 2023-12-18 DIAGNOSIS — F3181 Bipolar II disorder: Secondary | ICD-10-CM | POA: Diagnosis not present

## 2023-12-22 ENCOUNTER — Ambulatory Visit: Payer: Medicare Other

## 2023-12-25 ENCOUNTER — Ambulatory Visit
Admission: RE | Admit: 2023-12-25 | Discharge: 2023-12-25 | Disposition: A | Discharge status: Home / Self Care | Payer: Medicare Other | Source: Ambulatory Visit | Attending: Psychiatry | Admitting: Psychiatry

## 2023-12-25 DIAGNOSIS — Z9189 Other specified personal risk factors, not elsewhere classified: Secondary | ICD-10-CM | POA: Insufficient documentation

## 2023-12-26 ENCOUNTER — Telehealth: Payer: Self-pay | Admitting: Psychiatry

## 2023-12-26 DIAGNOSIS — F324 Major depressive disorder, single episode, in partial remission: Secondary | ICD-10-CM | POA: Diagnosis not present

## 2023-12-26 DIAGNOSIS — Z79899 Other long term (current) drug therapy: Secondary | ICD-10-CM

## 2023-12-26 DIAGNOSIS — R7303 Prediabetes: Secondary | ICD-10-CM | POA: Diagnosis not present

## 2023-12-26 DIAGNOSIS — E785 Hyperlipidemia, unspecified: Secondary | ICD-10-CM | POA: Diagnosis not present

## 2023-12-26 DIAGNOSIS — F3132 Bipolar disorder, current episode depressed, moderate: Secondary | ICD-10-CM

## 2023-12-26 DIAGNOSIS — Z0000 Encounter for general adult medical examination without abnormal findings: Secondary | ICD-10-CM | POA: Diagnosis not present

## 2023-12-26 DIAGNOSIS — G4733 Obstructive sleep apnea (adult) (pediatric): Secondary | ICD-10-CM | POA: Diagnosis not present

## 2023-12-26 DIAGNOSIS — G252 Other specified forms of tremor: Secondary | ICD-10-CM | POA: Diagnosis not present

## 2023-12-26 DIAGNOSIS — M810 Age-related osteoporosis without current pathological fracture: Secondary | ICD-10-CM | POA: Diagnosis not present

## 2023-12-26 DIAGNOSIS — Z23 Encounter for immunization: Secondary | ICD-10-CM | POA: Diagnosis not present

## 2023-12-26 MED ORDER — LITHIUM CARBONATE ER 300 MG PO TBCR
300.0000 mg | EXTENDED_RELEASE_TABLET | Freq: Two times a day (BID) | ORAL | 1 refills | Status: DC
Start: 2023-12-26 — End: 2024-02-25

## 2023-12-26 NOTE — Telephone Encounter (Signed)
 Contacted patient to discuss EKG resulted 12/25/2023-sinus bradycardia, QTc-379. Will increase lithium since patient is currently completely off of the risperidone and seems to be getting more and more depressed.  Increase lithium to 600 mg daily.  I have ordered lithium level, renal function.  Patient to go to [facility] lab 5 days after starting lithium higher dosage.

## 2024-01-01 ENCOUNTER — Other Ambulatory Visit
Admission: RE | Admit: 2024-01-01 | Discharge: 2024-01-01 | Disposition: A | Discharge status: Home / Self Care | Source: Ambulatory Visit | Attending: Psychiatry | Admitting: Psychiatry

## 2024-01-01 DIAGNOSIS — Z79899 Other long term (current) drug therapy: Secondary | ICD-10-CM

## 2024-01-01 LAB — CREATININE, SERUM
Creatinine, Ser: 0.96 mg/dL (ref 0.44–1.00)
GFR, Estimated: >60 mL/min (ref >60)

## 2024-01-01 LAB — LITHIUM LEVEL: Lithium Lvl: 0.74 mmol/L (ref 0.60–1.20)

## 2024-01-01 LAB — BUN: BUN: 17 mg/dL (ref 8–23)

## 2024-01-04 ENCOUNTER — Encounter: Payer: Self-pay | Admitting: Psychiatry

## 2024-01-04 ENCOUNTER — Telehealth: Payer: Medicare Other | Admitting: Psychiatry

## 2024-01-04 DIAGNOSIS — G4733 Obstructive sleep apnea (adult) (pediatric): Secondary | ICD-10-CM

## 2024-01-04 DIAGNOSIS — R251 Tremor, unspecified: Secondary | ICD-10-CM | POA: Diagnosis not present

## 2024-01-04 DIAGNOSIS — F3132 Bipolar disorder, current episode depressed, moderate: Secondary | ICD-10-CM | POA: Diagnosis not present

## 2024-01-04 DIAGNOSIS — F411 Generalized anxiety disorder: Secondary | ICD-10-CM

## 2024-01-04 MED ORDER — BENZTROPINE MESYLATE 1 MG PO TABS: 1.0000 mg | ORAL_TABLET | Freq: Two times a day (BID) | ORAL | 1 refills | Status: DC

## 2024-01-04 NOTE — Progress Notes (Unsigned)
 Virtual Visit via Video Note  I connected with [name] on 01/04/24 at  3:20 PM EDT by a video enabled telemedicine application and verified that I am speaking with the correct person using two identifiers.  Location Provider Location : [facility] Patient Location : Home  Participants: Patient , Provider    I discussed the limitations of evaluation and management by telemedicine and the availability of in person appointments. The patient expressed understanding and agreed to proceed.   I discussed the assessment and treatment plan with the patient. The patient was provided an opportunity to ask questions and all were answered. The patient agreed with the plan and demonstrated an understanding of the instructions.   The patient was advised to call back or seek an in-person evaluation if the symptoms worsen or if the condition fails to improve as anticipated.   BH MD OP Progress Note  01/05/2024 7:42 AM [name]  MRN:  [id number]  Chief Complaint:  Chief Complaint  Patient presents with   Follow-up   Depression   Anxiety   Medication Refill   HPI: [name] is a 63 year old Caucasian female on disability, married, currently lives in [place], has a history of bipolar disorder, GAD, primary insomnia, OSA on CPAP, hyperlipidemia, osteoporosis, history of cataract surgery was evaluated by telemedicine today.  She experiences significant shakiness and tremors, which sometimes impair her ability to walk. The tremors have persisted despite discontinuing risperidone 15 days ago. She is currently taking benztropine at night with her last dose of lithium. Her current medication regimen includes lithium twice daily, with doses at 1 PM and 7 PM, and Depakote, taking 500 mg and 250 mg doses.  She describes her mood as 'down' and reports sleeping excessively, about 11-12 hours at night with additional naps during the day. She believes lithium may be helping with her  depression, although she still experiences significant fatigue and low energy.  Denies suicidal thoughts. She also notes a decrease in appetite and difficulty concentrating every day. She describes her daily functioning as 'difficult'.  Her recent TSH level was 6.38, which is high, but she is not on any thyroid medication. She uses a CPAP machine every night for sleep apnea.  She is planning to repeat her TSH level and has labs scheduled through her primary care provider.  Patient aware lithium does have an effect on thyroid function.  She is engaging in therapy and using the Mood Fit app to track her mood and perform breathing exercises.  Visit Diagnosis:    ICD-10-CM   1. Bipolar 1 disorder, depressed, moderate (HCC)  F31.32 benztropine (COGENTIN) 1 MG tablet    2. GAD (generalized anxiety disorder)  F41.1 benztropine (COGENTIN) 1 MG tablet    3. OSA on CPAP  G47.33     4. Tremor  R25.1 Ambulatory referral to Neurology   Unspecified      Past Psychiatric History: I have reviewed past psychiatric history from progress note on 09/01/2021.  Past trials of lithium-side effect of nausea, Ativan-side effect, Depakote, Trileptal-gained weight, Latuda-akathisia, Effexor-made her manic, trazodone, Tranxene, Zoloft, Prozac, BuSpar-did not help, Lamictal-restless, Seroquel, Abilify, TCA, modafinil.  Past Medical History:  Past Medical History:  Diagnosis Date   Bipolar 1 disorder (HCC)    Hyperlipidemia    OSA (obstructive sleep apnea)    CPAP dependent   Osteoporosis    PPD positive     Past Surgical History:  Procedure Laterality Date   ABDOMINAL HYSTERECTOMY  CATARACT EXTRACTION, BILATERAL     due to seroquel   ROBOTIC ASSISTED LAPAROSCOPIC HYSTERECTOMY AND SALPINGECTOMY      Family Psychiatric History: I have reviewed family psychiatric history from progress note on 09/01/2021.  Family History:  Family History  Problem Relation Age of Onset   Personality disorder Mother     Alcohol abuse Maternal Uncle    Bipolar disorder Cousin    Schizophrenia Other    Breast cancer Neg Hx     Social History: I have reviewed social history from progress note on 09/01/2021. Social History   Socioeconomic History   Marital status: Married    Spouse name: eric   Number of children: 2   Years of education: Not on file   Highest education level: Bachelor's degree (e.g., BA, AB, BS)  Occupational History    Comment: unemployed  Tobacco Use   Smoking status: Never   Smokeless tobacco: Never  Vaping Use   Vaping status: Never Used  Substance and Sexual Activity   Alcohol use: No   Drug use: No   Sexual activity: Yes  Other Topics Concern   Not on file  Social History Narrative   Not on file   Social Drivers of Health   Financial Resource Strain: Not on file  Food Insecurity: Not on file  Transportation Needs: Not on file  Physical Activity: Not on file  Stress: Not on file  Social Connections: Unknown (03/07/2022)   [facility]   Social Network    Social Network: Not on file    Allergies:  Allergies  Allergen Reactions   Tyloxapol Rash   Prilosec [Omeprazole]     Interacts with psych meds   Tylox [Oxycodone-Acetaminophen] Rash    Metabolic Disorder Labs: No results found for: "HGBA1C", "MPG" Lab Results  Component Value Date   PROLACTIN 5.1 09/01/2021   No results found for: "CHOL", "TRIG", "HDL", "CHOLHDL", "VLDL", "LDLCALC" Lab Results  Component Value Date   TSH 3.652 12/06/2023   TSH 0.955 09/01/2021    Therapeutic Level Labs: Lab Results  Component Value Date   LITHIUM 0.74 01/01/2024   LITHIUM 0.30 (L) 12/06/2023   Lab Results  Component Value Date   VALPROATE 89 05/24/2023   VALPROATE 69 01/17/2023   No results found for: "CBMZ"  Current Medications: Current Outpatient Medications  Medication Sig Dispense Refill   benztropine (COGENTIN) 1 MG tablet Take 1 tablet (1 mg total) by mouth 2 (two)  times daily. 60 tablet 1   alendronate (FOSAMAX) 70 MG tablet TAKE 1 TABLET BY MOUTH ONCE WEEKLY 30 MINUTES BEFORE FIRST FOOD, MEDICINE, OR WATER OF THE DAY     buPROPion (WELLBUTRIN) 75 MG tablet TAKE 1 TABLET(75 MG) BY MOUTH IN THE MORNING 90 tablet 1   divalproex (DEPAKOTE ER) 250 MG 24 hr tablet Take 1 tablet (250 mg total) by mouth daily with supper. Take along with 500 mg daily 90 tablet 1   divalproex (DEPAKOTE ER) 500 MG 24 hr tablet Take 1 tablet (500 mg total) by mouth daily. Take along with 250 mg daily 90 tablet 3   hydrOXYzine (ATARAX) 25 MG tablet Take 1-2 tablets (25-50 mg total) by mouth at bedtime as needed. For sleep, anxiety 180 tablet 3   lithium carbonate (LITHOBID) 300 MG ER tablet Take 1 tablet (300 mg total) by mouth 2 (two) times daily. 60 tablet 1   Multiple Vitamin (MULTIVITAMIN) tablet Take 1 tablet by mouth daily.  simvastatin (ZOCOR) 40 MG tablet Take 40 mg by mouth daily.     valACYclovir (VALTREX) 500 MG tablet 1 tablet Orally Twice a day as needed for outbreak     No current facility-administered medications for this visit.     Musculoskeletal: Strength & Muscle Tone:  UTA Gait & Station:  Seated Patient leans: N/A  Psychiatric Specialty Exam: Review of Systems  Constitutional:  Positive for fatigue.  Neurological:  Positive for tremors.  Psychiatric/Behavioral:  Positive for decreased concentration, dysphoric mood and sleep disturbance.     There were no vitals taken for this visit.There is no height or weight on file to calculate BMI.  General Appearance: Casual  Eye Contact:  Fair  Speech:  Clear and Coherent  Volume:  Normal  Mood:  Depressed  Affect:  Congruent  Thought Process:  Goal Directed and Descriptions of Associations: Intact  Orientation:  Full (Time, Place, and Person)  Thought Content: Logical   Suicidal Thoughts:  No  Homicidal Thoughts:  No  Memory:  Immediate;   Fair Recent;   Fair Remote;   Fair  Judgement:  Fair   Insight:  Fair  Psychomotor Activity:  Tremor  Concentration:  Concentration: Fair and Attention Span: Fair  Recall:  [employer] of Knowledge: Fair  Language: Fair  Akathisia:  No  Handed:  Right  AIMS (if indicated): Reports of tremors of bilateral upper extremities as well as postural  Assets:  Communication Skills Desire for Improvement Housing Social Support Transportation  ADL's:  Intact  Cognition: WNL  Sleep:   Excessive   Screenings: Geneticist, molecular Office Visit from 05/15/2023 in [facility] Office Visit from 02/01/2023 in [facility] Office Visit from 01/10/2023 in [facility] Office Visit from 12/22/2022 in [facility] Office Visit from 10/26/2022 in [facility]  AIMS Total Score 0 0 0 0 0      GAD-7    Flowsheet Row Office Visit from 05/15/2023 in [facility] Office Visit from 03/13/2023 in [facility] Office Visit from 02/01/2023 in [facility] Office Visit from 01/10/2023 in [facility] Office Visit from 12/22/2022 in [facility]  Total GAD-7 Score 13 16 7 13 20       PHQ2-9    Flowsheet Row Video Visit from 01/04/2024 in [facility] Video Visit from 07/25/2023 in [facility] Office Visit from 05[facility]15/2023 in [facility] Office Visit from 03/13/2023 in [facility] Office Visit from 02/01/2023 in [facility]  PHQ-2 Total Score 4 2 6 5 2   PHQ-9 Total Score 16 11 17 21 10        Flowsheet Row Video Visit from 01/04/2024 in [facility] Video Visit from 12/13/2023 in [facility] Video Visit from 12/01/2023 in [facility]  C-SSRS RISK CATEGORY Moderate Risk Moderate Risk Moderate Risk        Assessment and Plan: ANSLEE MICHELETTI is a 63 year old Caucasian female, married, disabled, lives in [place], has a history of bipolar disorder, anxiety disorder, tremors, obstructive sleep apnea was evaluated by telemedicine today.  Discussed assessment and plan as  noted below.  Bipolar Depression - unstable Persistent depressive symptoms include hypersomnia, low energy, and difficulty concentrating. Lithium may provide some benefit, but improvement is not substantial. PHQ-9 indicates moderate depression affecting daily functioning. Denies suicidal ideation. - Continue Lithium extended release 600 mg daily - Continue Depakote 750 mg daily (Depakote level 05/24/2023-89-therapeutic - Continue Wellbutrin 75 mg daily - Encourage exposure to sunlight and engaging in activities - Set an alarm to regulate sleep patterns - Continue using the Mood Fit app for mood tracking and exercises - Continue therapy sessions with Dr.Nicola  Tremors-unstable Significant shakiness affecting mobility persists despite discontinuation of risperidone 15 days ago, suggesting risperidone is not the cause. Current medications include lithium and Depakote, which are potential contributors.  - Refer to [facility] for evaluation of tremors - Increase Benztropine (Cogentin) to 1 mg twice a day - Instruct to skip 250 mg of Depakote a couple of times this week to assess impact on tremors. - Patient had tremors even before Lithium was initiated.  Generalized anxiety disorder-stable Currently denies any significant anxiety symptoms. - Continue Hydroxyzine 25 mg, take 2 tablets at  night. - Continue CBT with Dr. Lang Snow  Obstructive sleep apnea-stable Currently reports good compliance with CPAP although sleep continues to be excessive which is likely multifactorial including depression symptoms. - Encouraged to continue CPAP therapy.   Labs reviewed and discussed dated 01/01/2024-lithium level-0.74-therapeutic, BUN-17 within normal limits, creatinine-0.96-within normal limits. TSH dated 12/06/2023-3.652-within normal limits.  Patient however reports recent TSH level at primary care provider was slightly elevated and she is scheduled to repeat it in April.  Patient aware that lithium could affect thyroid function as well.  Follow-Up Requires close monitoring to assess symptom progression and treatment effectiveness. In-office follow-up scheduled to evaluate condition and discuss further options. - Schedule in-office follow-up appointment on March 24th at 11:30 AM - Advise to arrive early for the appointment - Monitor for neurology referral and schedule as soon as possible - Contact primary care provider to request earlier TSH test in early April  Collaboration of Care: Collaboration of Care: Referral or follow-up with counselor/therapist AEB patient encouraged to continue CBT  Patient/Guardian was advised Release of Information must be obtained prior to any record release in order to collaborate their care with an outside provider. Patient/Guardian was advised if they have not already done so to contact the registration department to sign all necessary forms in order for [place] to release information regarding their care.   Consent: Patient/Guardian gives verbal consent for treatment and assignment of benefits for services provided during this visit. Patient/Guardian expressed understanding and agreed to proceed.  Discussed the use of a AI scribe software for clinical note transcription with the patient, who gave verbal consent to proceed.  This note was generated in part or  whole with voice recognition software. Voice recognition is usually quite accurate but there are transcription errors that can and very often do occur. I apologize for any typographical errors that were not detected and corrected.     Jomarie Longs, MD 01/05/2024, 7:42 AM

## 2024-01-05 ENCOUNTER — Telehealth: Payer: Self-pay

## 2024-01-05 NOTE — Telephone Encounter (Signed)
 went online to [email or web address] and submitted the prior auth . - pending

## 2024-01-05 NOTE — Telephone Encounter (Signed)
 received fax that a prior auth is needed for the benztropine

## 2024-01-08 NOTE — Telephone Encounter (Signed)
 the prior [name] is approved until 01-04-2025

## 2024-01-10 ENCOUNTER — Encounter: Payer: Self-pay | Admitting: Neurology

## 2024-01-10 DIAGNOSIS — G4733 Obstructive sleep apnea (adult) (pediatric): Secondary | ICD-10-CM | POA: Diagnosis not present

## 2024-01-12 DIAGNOSIS — F3181 Bipolar II disorder: Secondary | ICD-10-CM | POA: Diagnosis not present

## 2024-01-15 ENCOUNTER — Ambulatory Visit: Admitting: Psychiatry

## 2024-01-15 ENCOUNTER — Encounter: Payer: Self-pay | Admitting: Psychiatry

## 2024-01-15 VITALS — BP 112/82 | HR 81 | Temp 98.1°F | Ht 65.0 in | Wt 164.0 lb

## 2024-01-15 DIAGNOSIS — F3132 Bipolar disorder, current episode depressed, moderate: Secondary | ICD-10-CM

## 2024-01-15 DIAGNOSIS — R251 Tremor, unspecified: Secondary | ICD-10-CM

## 2024-01-15 DIAGNOSIS — F411 Generalized anxiety disorder: Secondary | ICD-10-CM | POA: Diagnosis not present

## 2024-01-15 DIAGNOSIS — G4733 Obstructive sleep apnea (adult) (pediatric): Secondary | ICD-10-CM | POA: Diagnosis not present

## 2024-01-15 NOTE — Progress Notes (Unsigned)
 BH MD OP Progress Note  01/15/2024 12:42 PM JOPLIN CANTY  MRN:  [id number]  Chief Complaint:  Chief Complaint  Patient presents with   Follow-up   Anxiety   Depression   Medication Refill   HPI: [name] is a 63 year old Caucasian female, on disability, married, currently lives in [place], has a history of bipolar disorder, GAD, primary insomnia, OSA on CPAP, hyperlipidemia, osteoporosis, history of cataract surgery was evaluated in office today.  The tremors have worsened recently, affecting her ability to eat, stand, and walk. They are similar to those her mother experienced, diagnosed as essential tremors, and have been present for years but have become more severe. The tremors do not disappear during sleep.  She has a history of bipolar disorder and generalized anxiety disorder. She takes lithium, which helps with her depression, though she still experiences fatigue and low energy.  She continues to be compliant on the Depakote.  She denies any current side effects and reports her tremors  started even before she was started on these medications.  Hence unlikely due to these medications.  She uses a mood tracking app and performs breathing exercises.  Denies suicidal thoughts are present.  She has obstructive sleep apnea with 17 to 19 episodes of apnea per night. She uses a CPAP machine, but the settings may need adjustment as she continues to experience excessive sleepiness.  She is scheduled to have her thyroid function checked on April 4th due to concerns that lithium may be affecting her thyroid.  She has a history of using Seroquel and risperidone, which were stopped due to excessive tiredness and sleepiness. She has been on Seroquel for many years and started risperidone more recently. These medications can cause tardive dyskinesia.  She agrees to follow-up with neurologist, has upcoming appointment on March 31.  Visit Diagnosis:    ICD-10-CM   1. Bipolar 1  disorder, depressed, moderate (HCC)  F31.32     2. GAD (generalized anxiety disorder)  F41.1     3. OSA on CPAP  G47.33     4. Tremor  R25.1    Unspecified      Past Psychiatric History: I have reviewed past psychiatric history from progress note on 09/01/2021.  Past trials of lithium-side effects of nausea, Ativan-side effect, Depakote, Trileptal-weight gain, Latuda-akathisia, Effexor-made her manic, trazodone, Tranxene, Zoloft, Prozac, BuSpar-did not help, Lamictal-restlessness, Seroquel, Abilify, TCA, modafinil.  Past Medical History:  Past Medical History:  Diagnosis Date   Bipolar 1 disorder (HCC)    Hyperlipidemia    OSA (obstructive sleep apnea)    CPAP dependent   Osteoporosis    PPD positive     Past Surgical History:  Procedure Laterality Date   ABDOMINAL HYSTERECTOMY     CATARACT EXTRACTION, BILATERAL     due to seroquel   ROBOTIC ASSISTED LAPAROSCOPIC HYSTERECTOMY AND SALPINGECTOMY      Family Psychiatric History: Reviewed family psychiatric history from progress note on 09/01/2021.  Family History:  Family History  Problem Relation Age of Onset   Personality disorder Mother    Tremor Mother    Alcohol abuse Maternal Uncle    Bipolar disorder Cousin    Schizophrenia Other    Breast cancer Neg Hx     Social History: Reviewed social history from progress note on 09/01/2021. Social History   Socioeconomic History   Marital status: Married    Spouse name: [name]   Number of children: 2   Years of education: Not on file  Highest education level: Bachelor's degree (e.g., BA, AB, BS)  Occupational History    Comment: unemployed  Tobacco Use   Smoking status: Never   Smokeless tobacco: Never  Vaping Use   Vaping status: Never Used  Substance and Sexual Activity   Alcohol use: No   Drug use: No   Sexual activity: Yes  Other Topics Concern   Not on file  Social History Narrative   Not on file   Social Drivers of Health   Financial Resource Strain:  Not on file  Food Insecurity: Not on file  Transportation Needs: Not on file  Physical Activity: Not on file  Stress: Not on file  Social Connections: Unknown (03/07/2022)   [facility]   Social Network    Social Network: Not on file    Allergies:  Allergies  Allergen Reactions   Tyloxapol Rash   Prilosec [Omeprazole]     Interacts with psych meds   Tylox [Oxycodone-Acetaminophen] Rash    Metabolic Disorder Labs: No results found for: "HGBA1C", "MPG" Lab Results  Component Value Date   PROLACTIN 5.1 09/01/2021   No results found for: "CHOL", "TRIG", "HDL", "CHOLHDL", "VLDL", "LDLCALC" Lab Results  Component Value Date   TSH 3.652 12/06/2023   TSH 0.955 09/01/2021    Therapeutic Level Labs: Lab Results  Component Value Date   LITHIUM 0.74 01/01/2024   LITHIUM 0.30 (L) 12/06/2023   Lab Results  Component Value Date   VALPROATE 89 05/24/2023   VALPROATE 69 01/17/2023   No results found for: "CBMZ"  Current Medications: Current Outpatient Medications  Medication Sig Dispense Refill   alendronate (FOSAMAX) 70 MG tablet TAKE 1 TABLET BY MOUTH ONCE WEEKLY 30 MINUTES BEFORE FIRST FOOD, MEDICINE, OR WATER OF THE DAY     benztropine (COGENTIN) 1 MG tablet Take 1 tablet (1 mg total) by mouth 2 (two) times daily. 60 tablet 1   buPROPion (WELLBUTRIN) 75 MG tablet TAKE 1 TABLET(75 MG) BY MOUTH IN THE MORNING 90 tablet 1   divalproex (DEPAKOTE ER) 250 MG 24 hr tablet Take 1 tablet (250 mg total) by mouth daily with supper. Take along with 500 mg daily 90 tablet 1   divalproex (DEPAKOTE ER) 500 MG 24 hr tablet Take 1 tablet (500 mg total) by mouth daily. Take along with 250 mg daily 90 tablet 3   hydrOXYzine (ATARAX) 25 MG tablet Take 1-2 tablets (25-50 mg total) by mouth at bedtime as needed. For sleep, anxiety 180 tablet 3   lithium carbonate (LITHOBID) 300 MG ER tablet Take 1 tablet (300 mg total) by mouth 2 (two) times daily. 60 tablet 1    Multiple Vitamin (MULTIVITAMIN) tablet Take 1 tablet by mouth daily.     simvastatin (ZOCOR) 40 MG tablet Take 40 mg by mouth daily.     valACYclovir (VALTREX) 500 MG tablet 1 tablet Orally Twice a day as needed for outbreak     No current facility-administered medications for this visit.     Musculoskeletal: Strength & Muscle Tone: within normal limits Gait & Station: normal Patient leans: N/A  Psychiatric Specialty Exam: Review of Systems  Neurological:  Positive for tremors.  Psychiatric/Behavioral:  Positive for sleep disturbance. The patient is nervous/anxious.     Blood pressure 112/82, pulse 81, temperature 98.1 F (36.7 C), temperature source Temporal, height 5\' 5"  (1.651 m), weight 164 lb (74.4 kg), SpO2 97%.Body mass index is 27.29 kg/m.  General Appearance: Casual  Eye Contact:  Fair  Speech:  Clear and Coherent  Volume:  Normal  Mood:  Anxious  Affect:  Congruent  Thought Process:  Goal Directed and Descriptions of Associations: Intact  Orientation:  Full (Time, Place, and Person)  Thought Content: Logical   Suicidal Thoughts:  No  Homicidal Thoughts:  No  Memory:  Immediate;   Fair Recent;   Fair Remote;   Fair  Judgement:  Fair  Insight:  Fair  Psychomotor Activity:  Tremor  Concentration:  Concentration: Fair and Attention Span: Fair  Recall:  [employer] of Knowledge: Fair  Language: Fair  Akathisia:  No  Handed:  Right  AIMS (if indicated): done, has tremors BL hands, postural   Assets:  Desire for Improvement Housing Social Support Transportation  ADL's:  Intact  Cognition: WNL  Sleep:   Excessive   Screenings: Geneticist, molecular Office Visit from 05/15/2023 in [facility] Office Visit from 02/01/2023 in [facility] Office Visit from 01/10/2023 in [facility] Office Visit from 12/22/2022 in [facility] Office Visit from 10/26/2022 in [facility]  AIMS Total Score 0 0 0 0 0      GAD-7    Flowsheet Row Office Visit from 01/15/2024 in [facility] Office Visit from 05/15/2023 in [facility] Office Visit from 03/13/2023 in [facility] Office Visit from 02/01/2023 in [facility] Office Visit from 01/10/2023 in [facility]  Total GAD-7 Score 11 13 16 7 13       PHQ2-9    Flowsheet Row Office Visit from 01/15/2024 in [facility] Video Visit from 01/04/2024 in [facility] Video Visit from 07/25/2023 in [facility] Office Visit from 05/15/2023 in [facility] Office Visit from 03/13/2023 in [facility]  PHQ-2 Total Score 3 4 2 6 5   PHQ-9 Total Score 14 16 11 17 21       Flowsheet Row Office Visit from 01/15/2024 in [facility] Video Visit from 01/04/2024 in [facility] Video Visit from 12/13/2023 in [facility]  C-SSRS RISK CATEGORY Moderate Risk Moderate Risk Moderate Risk        Assessment and Plan: [name] is a 63 year old Caucasian female, married, disabled, lives in [place], has a history of bipolar disorder, anxiety disorder, tremors, obstructive sleep apnea was evaluated in the office today.  Discussed assessment and plan as noted below.   Tremors-unstable Worsening postural tremors affecting daily activities, with a family history of essential tremor. Differential includes essential tremor and tardive  dyskinesia due to past use of atypical antipsychotics (risperidone, Seroquel). Awaiting neurologist evaluation to confirm diagnosis before initiating treatment with Ingrezza for tardive dyskinesia. - Refer to Neurologist for further evaluation - Await Neurologist's assessment before starting Ingrezza, need to rule out other causes. - Continue Benztropine 1 mg twice a day for tremors.  Bipolar Disorder most recent episode depressed-improving Bipolar disorder with improvement in depressive symptoms on lithium. Concern for potential Lithium-induced thyroid dysfunction, which may necessitate discontinuation. - Continue Lithium extended release 600 mg daily.(Lithium level-therapeutic at 0.74 dated 01/01/2024.) - Reassess thyroid function on April 4th - Consider discontinuing lithium  if thyroid dysfunction is confirmed - Continue Depakote 750 mg daily. - Continue Wellbutrin 75 mg daily  Generalized Anxiety Disorder-stable Generalized anxiety disorder managed with therapy and [employer] app. No acute exacerbation of anxiety symptoms. - Continue therapy with Dr.Nicola and use of [employer] app - Continue Hydroxyzine 25 mg take 2 tablets at night.  Obstructive Sleep Apnea-unstable Ongoing obstructive sleep apnea with 17-19 episodes of apnea per night despite CPAP use. CPAP settings may need adjustment; follow-up scheduled to download CPAP data. Unresolved apnea may contribute to fatigue and low energy. - Continue CPAP therapy - Follow up with sleep specialist to adjust CPAP settings  Follow-up Neurology appointment scheduled for March 31st for tremor evaluation. Follow-up video appointment with psychiatry on April 2nd, contingent on neurologist's findings. Additional follow-up in late April or early May planned. - Schedule video follow-up appointment on April 2nd - Schedule additional follow-up appointment in late April or early May - Send MyChart message after neurology appointment to update  psychiatrist    Collaboration of Care: Collaboration of Care: Referral or follow-up with counselor/therapist AEB encouraged to keep appointment with therapist and Other encouraged follow-up with neurologist on March 31 for initial consultation  Patient/Guardian was advised Release of Information must be obtained prior to any record release in order to collaborate their care with an outside provider. Patient/Guardian was advised if they have not already done so to contact the registration department to sign all necessary forms in order for [place] to release information regarding their care.   Consent: Patient/Guardian gives verbal consent for treatment and assignment of benefits for services provided during this visit. Patient/Guardian expressed understanding and agreed to proceed.   Discussed the use of a AI scribe software for clinical note transcription with the patient, who gave verbal consent to proceed.  This note was generated in part or whole with voice recognition software. Voice recognition is usually quite accurate but there are transcription errors that can and very often do occur. I apologize for any typographical errors that were not detected and corrected.    Jomarie Longs, MD 01/16/2024, 8:29 AM

## 2024-01-18 NOTE — Progress Notes (Signed)
 Assessment/Plan:  1.  Tremor  -Patient has very complex tremor.  Some of her tremor is from lithium and/or Depakote.  They are working on decreasing her Depakote.  Studies are varied, but incidate that up to 2/3 of patients on lithium will have lithium-induced tremor, even if the patients are not toxic on the medication.  This is just a common side effect of patients on lithium.    -Patient does have some evidence of functional tremor on examination today.  She and I discussed the nature and pathophysiology of this.  Even though it was initially denied, her husband stated that the last few months have been very stressful.  He had a major surgery 8 weeks ago, and both of them agreed that this was a stressful event.  We talked about the fact that counseling could help.  She stated that she was in counseling.  We talked about cognitive behavioral therapy for this type of tremor.  2.  Mild secondary parkinsonism  -Patient noted after starting the Risperdal that she began to have some shuffling gait and worsening of tremor.  She likely got some mild secondary parkinsonism.  This can take up to 6 months to resolve, even though she was on quite low dosages.  She has only been off of this for about a month.  I would recommend that we just go ahead and take a wait and see approach.  She had the same thing with quetiapine, which rarely happens with quetiapine, and it also resolved when she went off of the quetiapine.  -I did not see any evidence of tardive dyskinesia and at this point I did not recommend Ingrezza, as that can make parkinsonism worse.  3.  She will follow-up here on an as-needed basis.  She was overall happy with the recommendations and consultation today.  Subjective:   [name] was seen today in the movement disorders clinic for neurologic consultation at the request of Jomarie Longs, MD.  The consultation is for the evaluation of tremor. Pt with husband who supplements hx.    The patient was seen here 10 years ago (March, 2015) for the same.  At that point in time, the patient was believed to have complex tremor, both due to lithium as well as possibly due to quetiapine.  We talked about the fact that generally quetiapine has less D2 receptor blockade than other atypicals, but felt that could be contributing to her tremor as well.  She returns today for evaluation of the same.  Notes available to me are reviewed.  Patient remains on lithium.  She is also on Depakote.  She is sent to evaluate her tremor to decide if she has essential tremor or tardive dyskinesia because of her past use of risperidone and quetiapine, amongst others.  They are considering treatment with Ingrezza and wanted a better diagnosis before starting.  She last picked up risperidone 09/25/23 for a 90 day supply (0.75mg ).  her VPA dosage just got decreased.  She has had ECT in the past (this was remote and nothing recent).  Tremor preceeded the lithium but got worse after but particularly worse over the last weeks.  It was the amplitude/frequency of the tremor that increased but not the timing of the tremor.  Tremor is most when using the hands.  She notes that this was the reason for the d/c of the risperdal but she hasn't noted change/improvement yet.    Tremor: Yes.     How long has  it been going on? 20 years  At rest or with activation?  activation  When is it noted the most?  Eating/writing/drinking  Fam hx of tremor?  Yes.   mother with essential tremor  Located where?  Bilateral UE - she is R handed  Affected by caffeine:  No.  Affected by alcohol: Does not drink alcohol  Affected by stress:  unknown  Affected by fatigue:  No.  Spills soup if on spoon:  Yes.    Spills glass of liquid if full:  Yes.    Affects ADL's (tying shoes, brushing teeth, etc):  Yes.    Tremor inducing meds:  Yes.   lithium; Depakote;  Tremor improving medications: Currently on Cogentin 1 mg twice per day  Other Specific  Symptoms:  Voice: raspy and "weak" - this is intermittent Wet Pillows: No. Postural symptoms:  Yes.    Falls?  No. Bradykinesia symptoms: shuffling gait and difficulty regaining balance - this is a fairly new sx over weeks/months Loss of smell:  No. Loss of taste:  No. Urinary Incontinence:  No. Difficulty Swallowing:  No. Handwriting, micrographia: potentially but only b/c trying to grab the pen to get control over it Depression:  its getting better N/V:  No. Lightheaded:  No.  Syncope: No. Diplopia:  No. Dyskinesia:  No.  Past Psychiatric History Per records: Past trials of lithium-side effects of nausea, Ativan-side effect, Depakote, Trileptal-weight gain, Latuda-akathisia, Effexor-made her manic, trazodone, Tranxene, Zoloft, Prozac, BuSpar-did not help, Lamictal-restlessness, Seroquel, Abilify, TCA, modafinil.   ALLERGIES:   Allergies  Allergen Reactions   Tyloxapol Rash   Prilosec [Omeprazole]     Interacts with psych meds   Tylox [Oxycodone-Acetaminophen] Rash    CURRENT MEDICATIONS:  Current Outpatient Medications  Medication Instructions   alendronate (FOSAMAX) 70 MG tablet TAKE 1 TABLET BY MOUTH ONCE WEEKLY 30 MINUTES BEFORE FIRST FOOD, MEDICINE, OR WATER OF THE DAY   benztropine (COGENTIN) 1 mg, Oral, 2 times daily   buPROPion (WELLBUTRIN) 75 MG tablet TAKE 1 TABLET(75 MG) BY MOUTH IN THE MORNING   divalproex (DEPAKOTE ER) 500 mg, Oral, Daily, Take along with 250 mg daily   hydrOXYzine (ATARAX) 25-50 mg, Oral, At bedtime PRN, For sleep, anxiety   lithium carbonate (LITHOBID) 300 mg, Oral, 2 times daily   Multiple Vitamin (MULTIVITAMIN) tablet 1 tablet, Daily   simvastatin (ZOCOR) 40 mg, Daily   valACYclovir (VALTREX) 500 MG tablet 1 tablet Orally Twice a day as needed for outbreak    Objective:   PHYSICAL EXAMINATION:    VITALS:   Vitals:   01/22/24 1252  BP: 124/86  Pulse: 84  SpO2: 96%  Weight: 160 lb 9.6 oz (72.8 kg)  Height: 5\' 5"  (1.651 m)     GEN:  The patient appears stated age and is in NAD. HEENT:  Normocephalic, atraumatic.  The mucous membranes are moist. The superficial temporal arteries are without ropiness or tenderness. CV:  RRR Lungs:  CTAB Neck/HEME:  There are no carotid bruits bilaterally.  Neurological examination:  Orientation: The patient is alert and oriented x3.  Cranial nerves: There is good facial symmetry.  Extraocular muscles are intact. The visual fields are full to confrontational testing. The speech is fluent and clear. Soft palate rises symmetrically and there is no tongue deviation. Hearing is intact to conversational tone. Sensation: Sensation is intact to light touch throughout (facial, trunk, extremities). Vibration is intact at the bilateral big toe. There is no extinction with double simultaneous stimulation.  Motor: Strength  is 5/5 in the bilateral upper and lower extremities.   Shoulder shrug is equal and symmetric.  There is no pronator drift. Deep tendon reflexes: Deep tendon reflexes are 2/4 at the bilateral biceps, triceps, brachioradialis, patella and achilles. Plantar responses are downgoing bilaterally.  Movement examination: Tone: There is normal tone in the bilateral upper extremities.  The tone in the lower extremities is normal.  Abnormal movements: There is tremor in the UE, noted most significantly with action and sustained posture.  She does have some entrainment.  She does have some variable frequency and amplitude of tremor.  She does have mild rest tremor of the hands, R > L .There is head titubation, irregular in frequency.  This is not different than last visit.  She has trouble with Archimedes spirals, much worse than last visit.     Coordination:  There is no decremation with RAM's, with any form of RAMS, including alternating supination and pronation of the forearm, hand opening and closing, finger taps, heel taps and toe taps.  Gait and Station: The patient has no  difficulty arising out of a deep-seated chair without the use of the hands. The patient's stride length is initially good, but the longer that she walks for short of the gait gets, but she does not shuffle.  We actually walked quite a ways around the office today.   I have reviewed and interpreted the following labs independently   Chemistry      Component Value Date/Time   NA 140 01/17/2023 0929   K 3.7 08/31/2022 1355   CL 103 08/31/2022 1355   CO2 22 08/31/2022 1355   BUN 17 01/01/2024 1115   CREATININE 0.96 01/01/2024 1115      Component Value Date/Time   CALCIUM 9.9 08/31/2022 1355   ALKPHOS 51 05/24/2023 1103   AST 17 05/24/2023 1103   ALT 12 05/24/2023 1103   BILITOT 1.0 05/24/2023 1103      Lab Results  Component Value Date   TSH 3.652 12/06/2023   Lab Results  Component Value Date   WBC 9.7 08/31/2022   HGB 13.9 08/31/2022   HCT 42.1 08/31/2022   MCV 94.8 08/31/2022   PLT 273 01/17/2023      Total time spent on today's visit was 60 minutes, including both face-to-face time and nonface-to-face time.  Time included that spent on review of records (prior notes available to me/labs/imaging if pertinent), discussing treatment and goals, answering patient's questions and coordinating care.  Cc:  Pahwani, Kasandra Knudsen, MD

## 2024-01-22 ENCOUNTER — Encounter: Payer: Self-pay | Admitting: Neurology

## 2024-01-22 ENCOUNTER — Ambulatory Visit: Admitting: Neurology

## 2024-01-22 VITALS — BP 124/86 | HR 84 | Ht 65.0 in | Wt 160.6 lb

## 2024-01-22 DIAGNOSIS — G251 Drug-induced tremor: Secondary | ICD-10-CM

## 2024-01-22 DIAGNOSIS — T43505A Adverse effect of unspecified antipsychotics and neuroleptics, initial encounter: Secondary | ICD-10-CM

## 2024-01-22 DIAGNOSIS — R251 Tremor, unspecified: Secondary | ICD-10-CM

## 2024-01-22 DIAGNOSIS — G2111 Neuroleptic induced parkinsonism: Secondary | ICD-10-CM

## 2024-01-22 NOTE — Patient Instructions (Signed)
Good to see you today!  

## 2024-01-24 ENCOUNTER — Telehealth: Admitting: Psychiatry

## 2024-01-24 ENCOUNTER — Other Ambulatory Visit: Payer: Self-pay | Admitting: Psychiatry

## 2024-01-24 ENCOUNTER — Encounter: Payer: Self-pay | Admitting: Psychiatry

## 2024-01-24 DIAGNOSIS — R251 Tremor, unspecified: Secondary | ICD-10-CM

## 2024-01-24 DIAGNOSIS — G4733 Obstructive sleep apnea (adult) (pediatric): Secondary | ICD-10-CM | POA: Diagnosis not present

## 2024-01-24 DIAGNOSIS — F3132 Bipolar disorder, current episode depressed, moderate: Secondary | ICD-10-CM

## 2024-01-24 DIAGNOSIS — F411 Generalized anxiety disorder: Secondary | ICD-10-CM | POA: Diagnosis not present

## 2024-01-24 MED ORDER — DIVALPROEX SODIUM ER 250 MG PO TB24: 250.0000 mg | ORAL_TABLET | Freq: Every day | ORAL | Status: DC

## 2024-01-24 MED ORDER — PROPRANOLOL HCL 10 MG PO TABS: 10.0000 mg | ORAL_TABLET | Freq: Three times a day (TID) | ORAL | 1 refills | Status: DC

## 2024-01-24 NOTE — Progress Notes (Unsigned)
 Virtual Visit via Video Note  I connected with [name] on 01/24/24 at 11:20 AM EDT by a video enabled telemedicine application and verified that I am speaking with the correct person using two identifiers.  Location Provider Location : [facility] Patient Location : Home  Participants: Patient , Provider   I discussed the limitations of evaluation and management by telemedicine and the availability of in person appointments. The patient expressed understanding and agreed to proceed.     I discussed the assessment and treatment plan with the patient. The patient was provided an opportunity to ask questions and all were answered. The patient agreed with the plan and demonstrated an understanding of the instructions.   The patient was advised to call back or seek an in-person evaluation if the symptoms worsen or if the condition fails to improve as anticipated.  BH MD OP Progress Note  01/24/2024 11:34 AM [name]  MRN:  [id number]  Chief Complaint:  Chief Complaint  Patient presents with   Anxiety   Depression   Medication Problem   Medication Refill   Follow-up   HPI: [name] is a 63 year old Caucasian female on disability, married, currently lives in [place], has a history of bipolar disorder, GAD, primary insomnia, OSA on CPAP, hyperlipidemia, osteoporosis, history of cataract surgery was evaluated by telemedicine today.  She experiences tremors that have been attributed to her use of psychotropics like Seroquel, risperidone lithium and Depakote. The tremors have persisted even before starting lithium, suggesting that other medications like risperidone, Seroquel, Depakote might be contributing. The tremors are severe enough to impact daily activities, such as barely holding a spoon to feed herself.  She has a history of mild parkinsonism, which began with the use of Seroquel in the past, leading to a shuffling gait and worsening tremor. This  condition resolved after discontinuing Seroquel, similar to her experience with risperidone. She is currently taking 500 mg of Depakote and has been taking benztropine (Cogentin), which has not helped with the tremors. She is also on lithium, with a recent level of 0.7.  She had had a neurology consultation, I have reviewed notes per Dr. Arbutus Leas dated 01/22/2024.  Recommended to give time since discontinuation of risperidone since risperidone likely could have contributed to her recent worsening of tremors.  Patient also with some evidence of functional tremor on examination likely due to recent stressors.  In terms of her mood, she feels pretty decent. No anxiety attacks, panic symptoms, or stress. She reports good sleep quality and no cardiac problems. She denies any thoughts of self-harm.  She denies any other concerns today.  Visit Diagnosis:    ICD-10-CM   1. Bipolar 1 disorder, depressed, moderate (HCC)  F31.32 divalproex (DEPAKOTE ER) 250 MG 24 hr tablet    2. GAD (generalized anxiety disorder)  F41.1     3. OSA on CPAP  G47.33     4. Tremor  R25.1 propranolol (INDERAL) 10 MG tablet   Parkinsonian secondary to psychotropics as well as functional      Past Psychiatric History: I have reviewed past psychiatric history from progress note on 09/01/2021.  Past trials of lithium-side effects of nausea, Ativan-side effect, Depakote, Trileptal-weight gain, Latuda-akathisia, Effexor-made her manic, trazodone, Tranxene, Zoloft, Prozac, BuSpar-did not help, Lamictal-restless, Seroquel, risperidone, Abilify, TCA, modafinil  Past Medical History:  Past Medical History:  Diagnosis Date   Bipolar 1 disorder (HCC)    Hyperlipidemia    OSA (obstructive sleep apnea)    CPAP  dependent   Osteoporosis    PPD positive     Past Surgical History:  Procedure Laterality Date   ABDOMINAL HYSTERECTOMY     CATARACT EXTRACTION, BILATERAL     due to seroquel   ROBOTIC ASSISTED LAPAROSCOPIC HYSTERECTOMY AND  SALPINGECTOMY      Family Psychiatric History: I have reviewed family psychiatric history from progress note on 09/01/2021.  Family History:  Family History  Problem Relation Age of Onset   Personality disorder Mother    Tremor Mother    Alcohol abuse Maternal Uncle    Tremor Maternal Grandfather    Bipolar disorder Cousin    Schizophrenia Other    Breast cancer Neg Hx     Social History: I have reviewed social history from progress note on 09/01/2021. Social History   Socioeconomic History   Marital status: Married    Spouse name: eric   Number of children: 2   Years of education: Not on file   Highest education level: Bachelor's degree (e.g., BA, AB, BS)  Occupational History    Comment: unemployed  Tobacco Use   Smoking status: Never   Smokeless tobacco: Never  Vaping Use   Vaping status: Never Used  Substance and Sexual Activity   Alcohol use: No   Drug use: No   Sexual activity: Yes  Other Topics Concern   Not on file  Social History Narrative   Right handed   Dosnt work    Social Drivers of Corporate investment banker Strain: Not on file  Food Insecurity: Not on file  Transportation Needs: Not on file  Physical Activity: Not on file  Stress: Not on file  Social Connections: Unknown (03/07/2022)   [facility]   Social Network    Social Network: Not on file    Allergies:  Allergies  Allergen Reactions   Tyloxapol Rash   Prilosec [Omeprazole]     Interacts with psych meds   Tylox [Oxycodone-Acetaminophen] Rash    Metabolic Disorder Labs: No results found for: "HGBA1C", "MPG" Lab Results  Component Value Date   PROLACTIN 5.1 09/01/2021   No results found for: "CHOL", "TRIG", "HDL", "CHOLHDL", "VLDL", "LDLCALC" Lab Results  Component Value Date   TSH 3.652 12/06/2023   TSH 0.955 09/01/2021    Therapeutic Level Labs: Lab Results  Component Value Date   LITHIUM 0.74 01/01/2024   LITHIUM 0.30 (L) 12/06/2023    Lab Results  Component Value Date   VALPROATE 89 05/24/2023   VALPROATE 69 01/17/2023   No results found for: "CBMZ"  Current Medications: Current Outpatient Medications  Medication Sig Dispense Refill   divalproex (DEPAKOTE ER) 250 MG 24 hr tablet Take 1 tablet (250 mg total) by mouth daily for 21 days. Weaning off     propranolol (INDERAL) 10 MG tablet Take 1 tablet (10 mg total) by mouth 3 (three) times daily. For tremors 90 tablet 1   alendronate (FOSAMAX) 70 MG tablet TAKE 1 TABLET BY MOUTH ONCE WEEKLY 30 MINUTES BEFORE FIRST FOOD, MEDICINE, OR WATER OF THE DAY     buPROPion (WELLBUTRIN) 75 MG tablet TAKE 1 TABLET(75 MG) BY MOUTH IN THE MORNING 90 tablet 1   hydrOXYzine (ATARAX) 25 MG tablet Take 1-2 tablets (25-50 mg total) by mouth at bedtime as needed. For sleep, anxiety 180 tablet 3   lithium carbonate (LITHOBID) 300 MG ER tablet Take 1 tablet (300 mg total) by mouth 2 (two) times daily. (Patient taking differently: Take 300 mg  by mouth 2 (two) times daily. 300 XR 1:00 and the in the evening) 60 tablet 1   Multiple Vitamin (MULTIVITAMIN) tablet Take 1 tablet by mouth daily.     simvastatin (ZOCOR) 40 MG tablet Take 40 mg by mouth daily.     valACYclovir (VALTREX) 500 MG tablet 1 tablet Orally Twice a day as needed for outbreak     No current facility-administered medications for this visit.     Musculoskeletal: Strength & Muscle Tone:  UTA Gait & Station:  Seated Patient leans: N/A  Psychiatric Specialty Exam: Review of Systems  Neurological:  Positive for tremors.  Psychiatric/Behavioral:  The patient is nervous/anxious.     There were no vitals taken for this visit.There is no height or weight on file to calculate BMI.  General Appearance: Casual  Eye Contact:  Good  Speech:  Clear and Coherent  Volume:  Normal  Mood:  Anxious  Affect:  Appropriate  Thought Process:  Goal Directed and Descriptions of Associations: Intact  Orientation:  Full (Time, Place, and  Person)  Thought Content: Logical   Suicidal Thoughts:  No  Homicidal Thoughts:  No  Memory:  Immediate;   Fair Recent;   Fair Remote;   Fair  Judgement:  Fair  Insight:  Fair  Psychomotor Activity:  Tremor  Concentration:  Concentration: Fair and Attention Span: Fair  Recall:  [employer] of Knowledge: Fair  Language: Fair  Akathisia:  No  Handed:  Right  AIMS (if indicated): not done  Assets:  Desire for Improvement Housing Intimacy Social Support  ADL's:  Intact  Cognition: WNL  Sleep:  Fair   Screenings: Midwife Visit from 05/15/2023 in [facility] Office Visit from 02/01/2023 in [facility] Office Visit from 01/10/2023 in [facility] Office Visit from 12/22/2022 in [facility] Office Visit from 10/26/2022 in [facility]  AIMS Total Score 0 0 0 0 0      GAD-7    Flowsheet Row Office Visit from 01/15/2024 in [facility] Office Visit from 05/15/2023 in [facility] Office Visit from 03/13/2023 in [facility] Office Visit from 02/01/2023 in [facility] Office Visit from 01/10/2023 in [facility]  Total GAD-7 Score 11 13 16 7 13       PHQ2-9    Flowsheet Row Office Visit from 01/15/2024 in [facility] Video Visit from 01/04/2024 in [facility] Video Visit from 07/25/2023 in [facility] Office Visit from 05/15/2023 in [facility] Office Visit from 03/13/2023 in [facility]  PHQ-2 Total Score 3 4 2 6 5   PHQ-9 Total Score 14 16 11 17 21       Flowsheet Row Video Visit from 01/24/2024 in [facility] Office Visit from 01/15/2024 in [facility] Video Visit from 01/04/2024 in [facility]  C-SSRS RISK CATEGORY Moderate Risk Moderate Risk Moderate Risk        Assessment and Plan: [name] is a 63 year old Caucasian female, married, disabled, lives in [place], has a history of bipolar disorder, anxiety disorder, tremors, obstructive sleep apnea was  evaluated by telemedicine today.  Discussed assessment and plan as noted below.  Bipolar disorder most recent episode depressed in partial remission Currently on Lithium and Depakote for mood stabilization. Mood reported as decent. Depakote reduction considered to manage tremors, with potential need for alternative mood stabilizer if mood destabilizes. - Continue Lithium extended release 600 mg daily (lithium level-0.74-3/07/2024 therapeutic) - Reassess thyroid function on April 4 - Reduce Depakote to 250 mg daily for 3 weeks and stopped taking it - Continue Wellbutrin 75 mg daily - Monitor mood changes post-Depakote reduction. - Evaluate need for replacement mood stabilizer if mood changes occur.  Generalized anxiety disorder-stable Currently denies any significant anxiety symptoms. - Continue follow-up with Dr. Lang Snow, therapist - Continue Hydroxyzine 25 mg take 2 tablets at night.  Tremor-unstable Tremors potentially induced by Lithium or Depakote, with evidence of functional tremor. Severity affects daily activities, such as holding a spoon. Mild parkinsonism induced by quetiapine resolved after discontinuation. Neurologist did not recommend Ingrezza due to lack of tardive dyskinesia. Decision to reduce Depakote to assess impact on tremor. Propranolol chosen  due to  efficacy in managing tremors, with discussion of risks including lowered heart rate, decreased blood pressure, dizziness, and depression, though low dosage minimizes these risks. - Reduce Depakote to 250 mg for three weeks, then discontinue. - Discontinue Benztropine (Cogentin) due to ineffectiveness. - Initiate Propranolol 10 mg three times daily to manage tremors. - Monitor for Propranolol side effects: lowered heart rate, decreased blood pressure, dizziness, depression. - Consider Amantadine if propranolol is ineffective. - I have reviewed notes per Dr. Arbutus Leas dated 01/22/2024-neurologist as noted above.  Obstructive sleep apnea-improving Currently reports sleep as improved. - Continue CPAP therapy. - Follow-up with sleep specialist to adjust CPAP setting.  Follow-up - Follow-up in clinic in 3 to 4 weeks or sooner if needed.   Collaboration of Care: Collaboration of Care: Referral or follow-up with counselor/therapist AEB encouraged to continue psychotherapy sessions.  Patient/Guardian was advised Release of Information must be obtained prior to any record release in order to collaborate their care with an outside provider. Patient/Guardian was advised if they have not already done so to contact the registration department to sign all necessary forms in order for [place] to release information regarding their care.   Consent: Patient/Guardian gives verbal consent for treatment and assignment of benefits for services provided during this visit. Patient/Guardian expressed understanding and agreed to proceed.  Discussed the use of a AI scribe software for clinical note transcription with the patient, who gave verbal consent to proceed.  This note was generated in part or whole with voice recognition software. Voice recognition is usually quite accurate but there are transcription errors that can and very often do occur. I apologize for any typographical errors that were not detected and  corrected.     Jomarie Longs, MD 01/25/2024, 8:58 AM

## 2024-01-26 DIAGNOSIS — R946 Abnormal results of thyroid function studies: Secondary | ICD-10-CM | POA: Diagnosis not present

## 2024-02-05 ENCOUNTER — Emergency Department (HOSPITAL_BASED_OUTPATIENT_CLINIC_OR_DEPARTMENT_OTHER)
Admission: EM | Admit: 2024-02-05 | Discharge: 2024-02-05 | Disposition: A | Discharge status: Home / Self Care | Attending: Emergency Medicine | Admitting: Emergency Medicine

## 2024-02-05 ENCOUNTER — Encounter (HOSPITAL_BASED_OUTPATIENT_CLINIC_OR_DEPARTMENT_OTHER): Payer: Self-pay

## 2024-02-05 ENCOUNTER — Telehealth: Payer: Self-pay

## 2024-02-05 ENCOUNTER — Other Ambulatory Visit: Payer: Self-pay

## 2024-02-05 DIAGNOSIS — R197 Diarrhea, unspecified: Secondary | ICD-10-CM | POA: Diagnosis not present

## 2024-02-05 DIAGNOSIS — R001 Bradycardia, unspecified: Secondary | ICD-10-CM | POA: Diagnosis not present

## 2024-02-05 DIAGNOSIS — R112 Nausea with vomiting, unspecified: Secondary | ICD-10-CM | POA: Insufficient documentation

## 2024-02-05 LAB — COMPREHENSIVE METABOLIC PANEL WITH GFR
ALT: 26 U/L (ref 0–44)
AST: 19 U/L (ref 15–41)
Albumin: 4.5 g/dL (ref 3.5–5.0)
Alkaline Phosphatase: 63 U/L (ref 38–126)
Anion gap: 7 (ref 5–15)
BUN: 9 mg/dL (ref 8–23)
CO2: 30 mmol/L (ref 22–32)
Calcium: 10.2 mg/dL (ref 8.9–10.3)
Chloride: 99 mmol/L (ref 98–111)
Creatinine, Ser: 0.95 mg/dL (ref 0.44–1.00)
GFR, Estimated: >60 mL/min (ref >60)
Glucose, Bld: 118 mg/dL — ABNORMAL HIGH (ref 70–99)
Potassium: 4.2 mmol/L (ref 3.5–5.1)
Sodium: 136 mmol/L (ref 135–145)
Total Bilirubin: 1 mg/dL (ref 0.0–1.2)
Total Protein: 7.7 g/dL (ref 6.5–8.1)

## 2024-02-05 LAB — CBC WITH DIFFERENTIAL/PLATELET
Abs Immature Granulocytes: 0.05 10*3/uL (ref 0.00–0.07)
Basophils Absolute: 0 10*3/uL (ref 0.0–0.1)
Basophils Relative: 0 %
Eosinophils Absolute: 0.1 10*3/uL (ref 0.0–0.5)
Eosinophils Relative: 1 %
HCT: 44 % (ref 36.0–46.0)
Hemoglobin: 15 g/dL (ref 12.0–15.0)
Immature Granulocytes: 1 %
Lymphocytes Relative: 25 %
Lymphs Abs: 2.4 10*3/uL (ref 0.7–4.0)
MCH: 33.3 pg (ref 26.0–34.0)
MCHC: 34.1 g/dL (ref 30.0–36.0)
MCV: 97.6 fL (ref 80.0–100.0)
Monocytes Absolute: 0.5 10*3/uL (ref 0.1–1.0)
Monocytes Relative: 5 %
Neutro Abs: 6.8 10*3/uL (ref 1.7–7.7)
Neutrophils Relative %: 68 %
Platelets: 349 10*3/uL (ref 150–400)
RBC: 4.51 MIL/uL (ref 3.87–5.11)
RDW: 11.9 % (ref 11.5–15.5)
WBC: 9.8 10*3/uL (ref 4.0–10.5)
nRBC: 0 % (ref 0.0–0.2)

## 2024-02-05 LAB — URINALYSIS, ROUTINE W REFLEX MICROSCOPIC
Bilirubin Urine: NEGATIVE
Glucose, UA: NEGATIVE mg/dL
Ketones, ur: NEGATIVE mg/dL
Nitrite: NEGATIVE
Specific Gravity, Urine: 1.018 (ref 1.005–1.030)
pH: 7 (ref 5.0–8.0)

## 2024-02-05 LAB — LITHIUM LEVEL: Lithium Lvl: 1.01 mmol/L (ref 0.60–1.20)

## 2024-02-05 LAB — LIPASE, BLOOD: Lipase: 103 U/L — ABNORMAL HIGH (ref 11–51)

## 2024-02-05 MED ORDER — SODIUM CHLORIDE 0.9 % IV BOLUS
1000.0000 mL | Freq: Once | INTRAVENOUS | Status: AC
  Administered 2024-02-05: 1000 mL via INTRAVENOUS

## 2024-02-05 MED ORDER — ONDANSETRON HCL 4 MG/2ML IJ SOLN
4.0000 mg | Freq: Once | INTRAMUSCULAR | Status: AC
  Administered 2024-02-05: 4 mg via INTRAVENOUS
  Filled 2024-02-05: qty 2

## 2024-02-05 MED ORDER — PROCHLORPERAZINE MALEATE 10 MG PO TABS
10.0000 mg | ORAL_TABLET | Freq: Once | ORAL | Status: AC
  Administered 2024-02-05: 10 mg via ORAL
  Filled 2024-02-05: qty 1

## 2024-02-05 MED ORDER — ONDANSETRON HCL 4 MG PO TABS: 4.0000 mg | ORAL_TABLET | Freq: Three times a day (TID) | ORAL | 0 refills | Status: DC | PRN

## 2024-02-05 NOTE — Telephone Encounter (Signed)
 Patient today contacted the office stating she was feeling nauseous.  Contacted patient by phone to discuss.  She reports she currently has nausea and feels unwell.  Not sure what could be contributing to it.  Since she is on lithium she has been drinking enough water.  She believes propranolol is helping with her tremors and would like to stay on it.  Patient agrees to go to the emergency department if nausea does not get better or her symptoms get worse.

## 2024-02-05 NOTE — Telephone Encounter (Signed)
 pt left a message that she is felling very nausous last 4 days and that she can not eat . pt was last seen on 4-2 next appt 4-24

## 2024-02-05 NOTE — ED Provider Triage Note (Signed)
 Emergency Medicine Provider Triage Evaluation Note  [name] , a 63 y.o. female  was evaluated in triage.  Pt complains of nausea vomiting and diarrhea x 4 days.  Patient also started on lithium 600 mg twice daily 2 weeks ago.  She was noted to have shakes after starting the medication and is also placed on Inderal.  No confusion, no gait ataxia..  Review of Systems  Positive: Tremor/nausea vomiting diarrhea Negative: fever  Physical Exam  There were no vitals taken for this visit. Gen:   Awake, no distress   Resp:  Normal effort  MSK:   Moves extremities without difficulty  Other:    Medical Decision Making  Medically screening exam initiated at 4:00 PM.  Appropriate orders placed.  [name] was informed that the remainder of the evaluation will be completed by another provider, this initial triage assessment does not replace that evaluation, and the importance of remaining in the ED until their evaluation is complete.    Tama Fails, PA-C 02/05/24 [id number]

## 2024-02-05 NOTE — Discharge Instructions (Signed)
 1) Tomorrow, start cutting your propranalol 10gm in half. Begin taking 5mg  daily and monitor your heart rate. 2) Take the antinausea medication as directed  Get help right away if you: Have chest pain. Have trouble breathing or you are breathing very quickly. Have a fast heartbeat. Feel extremely weak or you faint. Have a severe headache, a stiff neck, or both. Have a rash. Have severe pain, cramping, or bloating in your abdomen. Have skin that feels cold and clammy. Feel confused. Have pain when you urinate. Have signs of dehydration, such as: Dark urine, very little urine, or no urine. Cracked lips. Dry mouth. Sunken eyes. Sleepiness. Weakness. Have signs of bleeding, such as: Seeing blood in your vomit. Having vomit that looks like coffee grounds. Having bloody or black stools or stools that look like tar. These symptoms may be an emergency. Get help right away. Call 911. Do not wait to see if the symptoms will go away. Do not drive yourself to the hospital.

## 2024-02-05 NOTE — ED Provider Notes (Signed)
 [facility] AT [facility] Provider Note   CSN: [id number] Arrival date & time: 02/05/24  1539     History  Chief Complaint  Patient presents with   Emesis   Nausea   Diarrhea    [name] is a 63 y.o. female with a past medical history of bipolar disorder who presents emergency department chief complaint of nausea vomiting and diarrhea x 4 days.  No recent foreign travel or sick contacts, last diarrhea was this morning and 2 episodes of vomiting just prior to arrival in the emergency department.  She has persistent nausea.  She denies any abdominal pain.  Patient has been on lithium 600 mg twice daily for the past 2 weeks is also on Inderal for tremor.  She was concerned that this could be due to lithium toxicity.  She denies fevers chest pain or shortness of breath   Emesis Associated symptoms: diarrhea   Diarrhea Associated symptoms: vomiting        Home Medications Prior to Admission medications   Medication Sig Start Date End Date Taking? Authorizing Provider  alendronate (FOSAMAX) 70 MG tablet TAKE 1 TABLET BY MOUTH ONCE WEEKLY 30 MINUTES BEFORE FIRST FOOD, MEDICINE, OR WATER OF THE DAY 08/10/20   [provider]  buPROPion (WELLBUTRIN) 75 MG tablet TAKE 1 TABLET(75 MG) BY MOUTH IN THE MORNING 08/14/23   Eappen, Saramma, MD  divalproex (DEPAKOTE ER) 250 MG 24 hr tablet Take 1 tablet (250 mg total) by mouth daily for 21 days. Weaning off 01/24/24 02/14/24  Eappen, Saramma, MD  hydrOXYzine (ATARAX) 25 MG tablet Take 1-2 tablets (25-50 mg total) by mouth at bedtime as needed. For sleep, anxiety 10/24/23   Eappen, Saramma, MD  lithium carbonate (LITHOBID) 300 MG ER tablet Take 1 tablet (300 mg total) by mouth 2 (two) times daily. Patient taking differently: Take 300 mg by mouth 2 (two) times daily. 300 XR 1:00 and the in the evening 12/26/23   Eappen, Saramma, MD  Multiple Vitamin (MULTIVITAMIN) tablet Take 1 tablet by mouth daily.     [provider]  propranolol (INDERAL) 10 MG tablet TAKE 1 TABLET(10 MG) BY MOUTH THREE TIMES DAILY FOR TREMORS 01/25/24   Eappen, Saramma, MD  simvastatin (ZOCOR) 40 MG tablet Take 40 mg by mouth daily.    [provider]  valACYclovir (VALTREX) 500 MG tablet 1 tablet Orally Twice a day as needed for outbreak    [provider]      Allergies    Tyloxapol, Prilosec [omeprazole], and Tylox [oxycodone-acetaminophen]    Review of Systems   Review of Systems  Gastrointestinal:  Positive for diarrhea and vomiting.    Physical Exam Updated Vital Signs BP (!) 147/73   Pulse (!) 48   Temp 98.6 F (37 C) (Oral)   Resp 20   SpO2 100%  Physical Exam Vitals and nursing note reviewed.  Constitutional:      General: She is not in acute distress.    Appearance: She is well-developed. She is not diaphoretic.  HENT:     Head: Normocephalic and atraumatic.     Right Ear: External ear normal.     Left Ear: External ear normal.     Nose: Nose normal.     Mouth/Throat:     Mouth: Mucous membranes are moist.  Eyes:     General: No scleral icterus.    Conjunctiva/sclera: Conjunctivae normal.  Cardiovascular:     Rate and Rhythm: Normal rate and  regular rhythm.     Heart sounds: Normal heart sounds. No murmur heard.    No friction rub. No gallop.  Pulmonary:     Effort: Pulmonary effort is normal. No respiratory distress.     Breath sounds: Normal breath sounds.  Abdominal:     General: Bowel sounds are normal. There is no distension.     Palpations: Abdomen is soft. There is no mass.     Tenderness: There is no abdominal tenderness. There is no guarding.  Musculoskeletal:     Cervical back: Normal range of motion.  Skin:    General: Skin is warm and dry.  Neurological:     Mental Status: She is alert and oriented to person, place, and time.  Psychiatric:        Behavior: Behavior normal.     ED Results / Procedures / Treatments   Labs (all labs ordered  are listed, but only abnormal results are displayed) Labs Reviewed  COMPREHENSIVE METABOLIC PANEL WITH GFR - Abnormal; Notable for the following components:      Result Value   Glucose, Bld 118 (*)    All other components within normal limits  LIPASE, BLOOD - Abnormal; Notable for the following components:   Lipase 103 (*)    All other components within normal limits  URINALYSIS, ROUTINE W REFLEX MICROSCOPIC - Abnormal; Notable for the following components:   Hgb urine dipstick SMALL (*)    Protein, ur TRACE (*)    Leukocytes,Ua MODERATE (*)    Bacteria, UA RARE (*)    Crystals PRESENT (*)    All other components within normal limits  CBC WITH DIFFERENTIAL/PLATELET  LITHIUM LEVEL    EKG EKG Interpretation Date/Time:  Monday February 05 2024 16:00:36 EDT Ventricular Rate:  50 PR Interval:  152 QRS Duration:  78 QT Interval:  418 QTC Calculation: 381 R Axis:   11  Text Interpretation: Sinus bradycardia Otherwise normal ECG When compared with ECG of 25-Dec-2023 10:49, No significant change was found Confirmed by Rolan Bucco [phone]) on 02/05/2024 7:13:02 PM  Radiology No results found.  Procedures Procedures    Medications Ordered in ED Medications  ondansetron (ZOFRAN) injection 4 mg (4 mg Intravenous Given 02/05/24 2006)  sodium chloride 0.9 % bolus 1,000 mL (1,000 mLs Intravenous New Bag/Given 02/05/24 2008)    ED Course/ Medical Decision Making/ A&P                                 Medical Decision Making Amount and/or Complexity of Data Reviewed Labs: ordered.  Risk Prescription drug management.   This patient presents to the ED with chief complaint(s) of nausea and vomiting and diarrhea with pertinent past medical history of bipolar disorder on lithium which further complicates the presenting complaint. The complaint involves an extensive differential diagnosis and treatment options and also carries with it a high risk of complications and morbidity.    The  differential diagnosis of diarrhea includes but is not limited to Viral- norovirus/rotavirus; Bacterial-Campylobacter,Shigella, Salmonella, Escherichia coli, E. coli 0157:H7, Yersinia enterocolitica, Vibrio cholerae, Clostridium difficile. Parasitic- Giardia lamblia, Cryptosporidium,Entamoeba histolytica,Cyclospora, Microsporidium. Toxin- Staphylococcus aureus, Bacillus cereus. Noninfectious causes include GI Bleed, Appendicitis, Mesenteric Ischemia, Diverticulitis, Adrenal Crisis, Thyroid Storm, Toxicologic exposures, Antibiotic or drug-associated, inflammatory bowel disease.    The initial plan is to order labs including CBC, CMP, lipase and lithium level and to treat patient with antiemetics and fluids for nausea and dehydration  Additional history obtained: Additional history obtained from been at bedside Records reviewed  psychiatry notes  Reassessment and review (also see workup area): Lab Tests: I Ordered, and personally interpreted labs.  The pertinent results include:   Labs are unremarkable, urine appears contaminated.  She has no urinary symptoms.  Lithium level within normal limits, lipase and CMP also unremarkable  Imaging Studies: N/A Consultations Obtained: N/A Medicines ordered and prescription drug management: Decrease dose of propranolol  to 5 mg- monitor HR daily. Follow up with prescribing physician for bradycardia and severe fatigue    Reevaluation of the patient after these medicines showed that the patient    improved     Complexity of problems addressed: Patient's presentation is most consistent with  acute illness/injury with systemic symptoms During patient's assessment  Disposition: After consideration of the diagnostic results and the patient's response to treatment,  I feel that the patent would benefit from discharge with strict return precautions .          Final Clinical Impression(s) / ED Diagnoses Final diagnoses:  None    Rx / DC  Orders ED Discharge Orders     None         Tama Fails, PA-C 02/05/24 2345    Hershel Los, MD 02/06/24 1049

## 2024-02-05 NOTE — ED Triage Notes (Signed)
 Pt reports concern for lithium toxicity, reports  NVD & lethargy x4-5 days. Endorses compliance w 600mg /day dosage, started last week. States she's been on lithium before, "but it's never been like this."

## 2024-02-07 DIAGNOSIS — R112 Nausea with vomiting, unspecified: Secondary | ICD-10-CM | POA: Diagnosis not present

## 2024-02-07 DIAGNOSIS — R195 Other fecal abnormalities: Secondary | ICD-10-CM | POA: Diagnosis not present

## 2024-02-07 DIAGNOSIS — F319 Bipolar disorder, unspecified: Secondary | ICD-10-CM | POA: Diagnosis not present

## 2024-02-07 DIAGNOSIS — G20C Parkinsonism, unspecified: Secondary | ICD-10-CM | POA: Diagnosis not present

## 2024-02-07 DIAGNOSIS — E039 Hypothyroidism, unspecified: Secondary | ICD-10-CM | POA: Diagnosis not present

## 2024-02-10 ENCOUNTER — Other Ambulatory Visit: Payer: Self-pay | Admitting: Psychiatry

## 2024-02-10 DIAGNOSIS — R413 Other amnesia: Secondary | ICD-10-CM

## 2024-02-14 DIAGNOSIS — K080 Exfoliation of teeth due to systemic causes: Secondary | ICD-10-CM | POA: Diagnosis not present

## 2024-02-15 ENCOUNTER — Encounter: Payer: Self-pay | Admitting: Psychiatry

## 2024-02-15 ENCOUNTER — Telehealth: Admitting: Psychiatry

## 2024-02-15 DIAGNOSIS — G4733 Obstructive sleep apnea (adult) (pediatric): Secondary | ICD-10-CM

## 2024-02-15 DIAGNOSIS — F411 Generalized anxiety disorder: Secondary | ICD-10-CM

## 2024-02-15 DIAGNOSIS — R251 Tremor, unspecified: Secondary | ICD-10-CM

## 2024-02-15 DIAGNOSIS — Z79899 Other long term (current) drug therapy: Secondary | ICD-10-CM

## 2024-02-15 DIAGNOSIS — F3132 Bipolar disorder, current episode depressed, moderate: Secondary | ICD-10-CM

## 2024-02-15 NOTE — Patient Instructions (Signed)
Amantadine Capsules or Tablets What is this medication? AMANTADINE (a MAN ta deen) prevents and treats infections caused by the flu virus (influenza). It works by slowing the spread of the flu virus in your body and reducing how long your symptoms last. It will not treat colds or infections caused by bacteria or other viruses. It will not replace the annual flu vaccine. It may also be used to treat movement disorders, including those caused by Parkinson disease and some medications. It works by balancing substances in your brain that help manage body movements and coordination. This reduces symptoms, such as body stiffness and tremors. This medicine may be used for other purposes; ask your health care provider or pharmacist if you have questions. COMMON BRAND NAME(S): Symmetrel What should I tell my care team before I take this medication? They need to know if you have any of these conditions: Depression Eczema Feel sleepy or have fallen asleep suddenly during the day Frequently drink alcohol Glaucoma Heart failure Kidney disease Low blood pressure Schizophrenia Seizures Sleep apnea Suicidal thoughts, plans, or attempt by you or a family member An unusual or allergic reaction to amantadine, other medications, foods, dyes, or preservatives Pregnant or trying to get pregnant Breast-feeding How should I use this medication? Take this medication by mouth with water. Take it as directed on the prescription label at the same time every day. You can take it with or without food. If it upsets your stomach, take it with food. Take all of this medication unless your care team tells you to stop it early. Keep taking it even if you think you are better. Talk to your care team about the use of this medication in children. While it may be prescribed for children for selected conditions, precautions do apply. People over 65 years of age may have a stronger reaction and need a smaller dose. Overdosage: If  you think you have taken too much of this medicine contact a poison control center or emergency room at once. NOTE: This medicine is only for you. Do not share this medicine with others. What if I miss a dose? If you miss a dose, take it as soon as you can. If it is almost time for your next dose, take only that dose. Do not take double or extra doses. What may interact with this medication? Acetazolamide Alcohol Antihistamines for allergy, cough, and cold Atropine Bupropion Certain medications for bladder problems, such as oxybutynin or tolterodine Certain medications for Parkinson disease, such as benztropine or trihexyphenidyl Certain medications for stomach problems, such as dicyclomine or hyoscyamine Certain medications for travel sickness, such as scopolamine Ipratropium Methazolamide Quinidine Quinine Sodium bicarbonate Some flu vaccines Thioridazine This list may not describe all possible interactions. Give your health care provider a list of all the medicines, herbs, non-prescription drugs, or dietary supplements you use. Also tell them if you smoke, drink alcohol, or use illegal drugs. Some items may interact with your medicine. What should I watch for while using this medication? Visit your care team for regular checks on your progress. Tell your care team if your symptoms do not start to get better or if they get worse. A severe reaction similar to neuroleptic malignant syndrome (NMS) may occur if you reduce the dose of or stop taking this medication too quickly. Symptoms of NMS include high fever, stiff muscles, increased sweating, fast or irregular heartbeat, and confusion. Contact your care team right away if think you have NMS. This medication may affect your coordination,  reaction time, or judgment. Do not drive or operate machinery until you know how this medication affects you. Sit up or stand slowly to reduce the risk of dizzy or fainting spells. Drinking alcohol with  this medication can increase the risk of these side effects. When taking this medication, you may fall asleep without notice. You may be doing activities, such as driving a car, talking, or eating. You may not feel drowsy before it happens. Contact your care team right away if this happens to you. There have been reports of increased sexual urges or other strong urges, such as gambling while taking this medication. If you experience any of these while taking this medication, you should report this to your care team as soon as possible. This medication may cause dry eyes and blurred vision. If you wear contact lenses, you may feel some discomfort. Lubricating eye drops may help. See your care team if the problem does not go away or is severe. Your mouth may get dry. Chewing sugarless gum or sucking hard candy and drinking plenty of water may help. Contact your care team if the problem does not go away or is severe. Talk to your care team about your risk of skin cancer. You may be more at risk for skin cancer if you take this medication. What side effects may I notice from receiving this medication? Side effects that you should report to your care team as soon as possible: Allergic reactions--skin rash, itching, hives, swelling of the face, lips, tongue, or throat Falling asleep during daily activities Low blood pressure--dizziness, feeling faint or lightheaded, blurry vision Mood and behavior changes--anxiety, nervousness, confusion, hallucinations, irritability, hostility, thoughts of suicide or self-harm, worsening mood, feelings of depression Trouble passing urine Urges to engage in impulsive behaviors such as gambling, binge eating, sexual activity, or shopping in ways that are unusual for you Side effects that usually do not require medical attention (report these to your care team if they continue or are bothersome): Blurry vision Confusion Constipation Dizziness Dry mouth Swelling of the  ankles, hands, or feet Trouble sleeping This list may not describe all possible side effects. Call your doctor for medical advice about side effects. You may report side effects to FDA at 1-800-FDA-1088. Where should I keep my medication? Keep out of the reach of children and pets. Store at room temperature between 20 and 25 degrees C (68 and 77 degrees F). Keep the container tightly closed. Get rid of any unused medication after the expiration date. To get rid of medications that are no longer needed or have expired: Take the medication to a medication take-back program. Check with your pharmacy or law enforcement to find a location. If you cannot return the medication, check the label or package insert to see if the medication should be thrown out in the garbage or flushed down the toilet. If you are not sure, ask your care team. If it is safe to put it in the trash, pour the medication out of the container. Mix the medication with cat litter, dirt, coffee grounds, or other unwanted substance. Seal the mixture in a bag or container. Put it in the trash. NOTE: This sheet is a summary. It may not cover all possible information. If you have questions about this medicine, talk to your doctor, pharmacist, or health care provider.  2024 [employer]vier/Gold Standard (2022-02-18 00:00:00)

## 2024-02-15 NOTE — Progress Notes (Unsigned)
 Virtual Visit via Video Note  I connected with [name] on 02/15/24 at  2:30 PM EDT by a video enabled telemedicine application and verified that I am speaking with the correct person using two identifiers.  Location Provider Location : [facility] Patient Location : Home  Participants: Patient , Provider   I discussed the limitations of evaluation and management by telemedicine and the availability of in person appointments. The patient expressed understanding and agreed to proceed.   I discussed the assessment and treatment plan with the patient. The patient was provided an opportunity to ask questions and all were answered. The patient agreed with the plan and demonstrated an understanding of the instructions.   The patient was advised to call back or seek an in-person evaluation if the symptoms worsen or if the condition fails to improve as anticipated.  BH MD OP Progress Note  02/16/2024 8:27 AM [name]  MRN:  [id number]  Chief Complaint:  Chief Complaint  Patient presents with   Follow-up   Depression   Anxiety   Medication Refill   Medication Reaction   Discussed the use of AI scribe software for clinical note transcription with the patient, who gave verbal consent to proceed.  History of Present Illness [name] is a 63 year old Caucasian female, on disability, married, currently lives in [place], has a history of bipolar disorder, GAD, primary insomnia, OSA on CPAP, hyperlipidemia, osteoporosis, history of cataract surgery was evaluated by telemedicine today.  She recently experienced significant nausea and vomiting for a week, which led to a visit to the emergency room. This episode is described as the worst she has ever felt. During this time, she discontinued propranolol , which she was using for tremor management, due to vomiting. Her heart rate was low, but lithium  levels remained normal.  I have reviewed notes per ED physician dated  02/06/2024-differential diagnosis multiple including viral etiology and patient was advised to reduce propranolol  to 5 mg and monitor her heart rate.  She has a history of tremors that persist despite treatment with propranolol . These tremors interfere with daily activities such as feeding herself and writing. Previous medications have been ineffective, and she is considering a break from trying new treatments.  She was recently started on levothyroxine 25 mcg for thyroid  issues identified after starting lithium . She began this medication about a week ago and is scheduled for a thyroid  level check in June. She is not currently taking Cogentin , which she had stopped previously.  She uses a CPAP machine for sleep apnea, and recent reports indicate good sleep quality.  Denies suicidal thoughts or hallucinations. She reports adequate energy levels during the day and maintains hydration, stating she drinks frequently.  She is currently compliant with her therapy sessions.   Visit Diagnosis:    ICD-10-CM   1. Bipolar 1 disorder, depressed, moderate (HCC)  F31.32 Lithium  level    2. GAD (generalized anxiety disorder)  F41.1     3. OSA on CPAP  G47.33     4. Tremor  R25.1    Parkinsonian secondary to psychotropics as well as functional    5. High risk medication use  Z79.899 Lithium  level      Past Psychiatric History: I have reviewed past psychiatric history from progress note on 09/01/2021.  Past trials of lithium -side effects of nausea, Ativan-side effect, Depakote , Trileptal-weight gain, Latuda-akathisia, Effexor-made her manic, trazodone, Tranxene, Zoloft, Prozac, BuSpar-did not help, Lamictal -restless, Seroquel , risperidone , Abilify, TCA, modafinil , propranolol -bradycardia.  Past Medical  History:  Past Medical History:  Diagnosis Date   Bipolar 1 disorder (HCC)    Hyperlipidemia    OSA (obstructive sleep apnea)    CPAP dependent   Osteoporosis    PPD positive     Past Surgical  History:  Procedure Laterality Date   ABDOMINAL HYSTERECTOMY     CATARACT EXTRACTION, BILATERAL     due to seroquel    ROBOTIC ASSISTED LAPAROSCOPIC HYSTERECTOMY AND SALPINGECTOMY      Family Psychiatric History: I have reviewed family psychiatric history from progress note on 09/01/2021.  Family History:  Family History  Problem Relation Age of Onset   Personality disorder Mother    Tremor Mother    Alcohol abuse Maternal Uncle    Tremor Maternal Grandfather    Bipolar disorder Cousin    Schizophrenia Other    Breast cancer Neg Hx     Social History: I have reviewed social history from progress note on 09/01/2021. Social History   Socioeconomic History   Marital status: Married    Spouse name: eric   Number of children: 2   Years of education: Not on file   Highest education level: Bachelor's degree (e.g., BA, AB, BS)  Occupational History    Comment: unemployed  Tobacco Use   Smoking status: Never   Smokeless tobacco: Never  Vaping Use   Vaping status: Never Used  Substance and Sexual Activity   Alcohol use: No   Drug use: No   Sexual activity: Yes  Other Topics Concern   Not on file  Social History Narrative   Right handed   Dosnt work    Social Drivers of Corporate investment banker Strain: Not on file  Food Insecurity: Not on file  Transportation Needs: Not on file  Physical Activity: Not on file  Stress: Not on file  Social Connections: Unknown (03/07/2022)   Received from [facility]   Social Network    Social Network: Not on file    Allergies:  Allergies  Allergen Reactions   Tyloxapol Rash   Prilosec [Omeprazole]     Interacts with psych meds   Propranolol  Nausea And Vomiting   Tylox [Oxycodone-Acetaminophen ] Rash    Metabolic Disorder Labs: No results found for: "HGBA1C", "MPG" Lab Results  Component Value Date   PROLACTIN 5.1 09/01/2021   No results found for: "CHOL", "TRIG", "HDL", "CHOLHDL", "VLDL", "LDLCALC" Lab  Results  Component Value Date   TSH 3.652 12/06/2023   TSH 0.955 09/01/2021    Therapeutic Level Labs: Lab Results  Component Value Date   LITHIUM  1.01 02/05/2024   LITHIUM  0.74 01/01/2024   Lab Results  Component Value Date   VALPROATE 89 05/24/2023   VALPROATE 69 01/17/2023   No results found for: "CBMZ"  Current Medications: Current Outpatient Medications  Medication Sig Dispense Refill   levothyroxine (SYNTHROID) 25 MCG tablet Take 25 mcg by mouth daily before breakfast.     alendronate (FOSAMAX) 70 MG tablet TAKE 1 TABLET BY MOUTH ONCE WEEKLY 30 MINUTES BEFORE FIRST FOOD, MEDICINE, OR WATER OF THE DAY     buPROPion  (WELLBUTRIN ) 75 MG tablet TAKE 1 TABLET(75 MG) BY MOUTH IN THE MORNING 90 tablet 1   hydrOXYzine  (ATARAX ) 25 MG tablet Take 1-2 tablets (25-50 mg total) by mouth at bedtime as needed. For sleep, anxiety 180 tablet 3   lithium  carbonate (LITHOBID ) 300 MG ER tablet Take 1 tablet (300 mg total) by mouth 2 (two) times daily. (Patient taking differently: Take 300  mg by mouth 2 (two) times daily. 300 XR 1:00 and the in the evening) 60 tablet 1   Multiple Vitamin (MULTIVITAMIN) tablet Take 1 tablet by mouth daily.     ondansetron  (ZOFRAN ) 4 MG tablet Take 1 tablet (4 mg total) by mouth every 8 (eight) hours as needed for nausea or vomiting. 10 tablet 0   simvastatin (ZOCOR) 40 MG tablet Take 40 mg by mouth daily.     valACYclovir (VALTREX) 500 MG tablet 1 tablet Orally Twice a day as needed for outbreak     No current facility-administered medications for this visit.     Musculoskeletal: Strength & Muscle Tone:  UTA Gait & Station:  Seated Patient leans: N/A  Psychiatric Specialty Exam: Review of Systems  Neurological:  Positive for tremors.  Psychiatric/Behavioral:  The patient is nervous/anxious.     There were no vitals taken for this visit.There is no height or weight on file to calculate BMI.  General Appearance: Casual  Eye Contact:  Fair  Speech:   Clear and Coherent  Volume:  Normal  Mood:  Anxious  Affect:  Congruent  Thought Process:  Goal Directed and Descriptions of Associations: Intact  Orientation:  Full (Time, Place, and Person)  Thought Content: Logical   Suicidal Thoughts:  No  Homicidal Thoughts:  No  Memory:  Immediate;   Fair Recent;   Fair Remote;   Fair  Judgement:  Fair  Insight:  Fair  Psychomotor Activity:  Tremor  Concentration:  Concentration: Fair and Attention Span: Fair  Recall:  [employer] of Knowledge: Fair  Language: Fair  Akathisia:  No  Handed:  Right  AIMS (if indicated): not done  Assets:  Desire for Improvement Housing Social Support Transportation  ADL's:  Intact  Cognition: WNL  Sleep:  Fair   Screenings: Midwife Visit from 05/15/2023 in [facility] Office Visit from 02/01/2023 in [facility] Office Visit from 01/10/2023 in [facility] Office Visit from 12/22/2022 in [facility] Office Visit from 10/26/2022 in [facility]  AIMS Total Score 0 0 0 0 0      GAD-7    Flowsheet Row Office Visit from 01/15/2024 in [facility] Office Visit from 05/15/2023 in [facility] Office Visit from 03/13/2023 in [facility] Office Visit from 02/01/2023 in [facility] Office Visit from 01/10/2023 in [facility]  Total GAD-7 Score 11 13 16 7 13       PHQ2-9    Flowsheet Row Office Visit from 01/15/2024 in [facility] Video Visit from 01/04/2024 in [facility] Video Visit from 07/25/2023 in [facility] Office Visit from 05/15/2023 in [facility] Office Visit from 03/13/2023 in [facility]  PHQ-2 Total Score 3 4 2 6 5   PHQ-9 Total Score 14 16 11 17 21       Flowsheet Row Video Visit from 02/15/2024 in [facility] ED from 02/05/2024 in [facility] at [facility] Video Visit from 01/24/2024 in [facility]  C-SSRS RISK CATEGORY Moderate Risk No Risk Moderate Risk  Assessment and Plan: [name] is a 63 year old Caucasian female, married, disabled, lives in [place], has a history of bipolar disorder, anxiety disorder, tremors, obstructive sleep apnea was evaluated by telemedicine today.  Patient with recent emergency department visit with nausea, vomiting and diarrhea, differential diagnosis include but not limited to viral etiology, possible concerns about propranolol  dosage, discussed assessment and plan as noted below.  Assessment & Plan Tremors-unstable Persistent tremors affecting daily activities such as writing and feeding. Propranolol  was discontinued due to bradycardia. Potential contribution of lithium  to tremors is considered, but she prefers no changes to her current medication regimen. Amantadine was discussed as a treatment option, but she prefers to delay adding new medications. Neurologist anticipates possible improvement in six months. - Provide information about Amantadine on MyChart for her review. - Consider starting Amantadine if she opts for it in the future. - Repeat Lithium  level in two weeks. - Consider discontinuing lithium  if tremors does not get better. -  Patient had Neurology consultation previously  Bipolar disorder most recent episode depressed-in partial remission Currently on lithium , reports mood symptoms as improving although  recently was worse due to being physically ill. - Continue Lithium  extended release 600 mg daily. (Most recent lithium  level-in the emergency department dated 02/05/2024-1.01.) - Continue Wellbutrin  75 mg daily  Generalized anxiety disorder-stable Currently anxiety symptoms are manageable although with recent exacerbation due to being physically ill. - Continue psychotherapy sessions with Dr. Peterson Brandt. - Continue Hydroxyzine  25 mg take 2 tablets at night.  Sleep apnea-improving Reports good sleep quality with CPAP therapy. Recent CPAP report was normal. - Encourage compliance on CPAP.  High risk medication use-will repeat Lithium  level in 2 weeks from now.  Patient to go to [facility] lab.  Will consider repeating TSH however patient has scheduled labs at her primary care provider's office in June.  Follow-up - Follow-up in clinic in 4 weeks or sooner if needed.   Collaboration of Care: Collaboration of Care: Referral or follow-up with counselor/therapist AEB continue CBT and follow-up with primary care provider  Patient/Guardian was advised Release of Information must be obtained prior to any record release in order to collaborate their care with an outside provider. Patient/Guardian was advised if they have not already done so to contact the registration department to sign all necessary forms in order for us  to release information regarding their care.   Consent: Patient/Guardian gives verbal consent for treatment and assignment of benefits for services provided during this visit. Patient/Guardian expressed understanding and agreed to proceed.  This note was generated in part or whole with voice recognition software. Voice recognition is usually quite accurate but there are transcription errors that can and very often do occur. I apologize for any typographical errors that were not detected and corrected.     Latisha Lasch, MD 02/16/2024, 8:27 AM

## 2024-02-24 ENCOUNTER — Other Ambulatory Visit: Payer: Self-pay | Admitting: Psychiatry

## 2024-02-24 DIAGNOSIS — F3132 Bipolar disorder, current episode depressed, moderate: Secondary | ICD-10-CM

## 2024-02-26 DIAGNOSIS — F3181 Bipolar II disorder: Secondary | ICD-10-CM | POA: Diagnosis not present

## 2024-03-13 ENCOUNTER — Other Ambulatory Visit
Admission: RE | Admit: 2024-03-13 | Discharge: 2024-03-13 | Disposition: A | Discharge status: Home / Self Care | Source: Ambulatory Visit | Attending: Psychiatry | Admitting: Psychiatry

## 2024-03-13 ENCOUNTER — Other Ambulatory Visit: Payer: Self-pay | Admitting: Psychiatry

## 2024-03-13 DIAGNOSIS — Z79899 Other long term (current) drug therapy: Secondary | ICD-10-CM | POA: Diagnosis not present

## 2024-03-13 DIAGNOSIS — F3132 Bipolar disorder, current episode depressed, moderate: Secondary | ICD-10-CM | POA: Diagnosis not present

## 2024-03-13 DIAGNOSIS — R251 Tremor, unspecified: Secondary | ICD-10-CM

## 2024-03-13 LAB — LITHIUM LEVEL: Lithium Lvl: 0.66 mmol/L (ref 0.60–1.20)

## 2024-03-14 ENCOUNTER — Ambulatory Visit: Payer: Self-pay | Admitting: Psychiatry

## 2024-03-21 ENCOUNTER — Encounter: Payer: Self-pay | Admitting: Psychiatry

## 2024-03-21 ENCOUNTER — Telehealth: Admitting: Psychiatry

## 2024-03-21 DIAGNOSIS — F411 Generalized anxiety disorder: Secondary | ICD-10-CM | POA: Diagnosis not present

## 2024-03-21 DIAGNOSIS — G4733 Obstructive sleep apnea (adult) (pediatric): Secondary | ICD-10-CM

## 2024-03-21 DIAGNOSIS — R251 Tremor, unspecified: Secondary | ICD-10-CM

## 2024-03-21 DIAGNOSIS — F3176 Bipolar disorder, in full remission, most recent episode depressed: Secondary | ICD-10-CM | POA: Diagnosis not present

## 2024-03-21 NOTE — Progress Notes (Signed)
 Virtual Visit via Video Note  I connected with [name] on 03/21/24 at  4:20 PM EDT by a video enabled telemedicine application and verified that I am speaking with the correct person using two identifiers.  Location Provider Location : [facility] Patient Location : Home  Participants: Patient , Provider    I discussed the limitations of evaluation and management by telemedicine and the availability of in person appointments. The patient expressed understanding and agreed to proceed.   I discussed the assessment and treatment plan with the patient. The patient was provided an opportunity to ask questions and all were answered. The patient agreed with the plan and demonstrated an understanding of the instructions.   The patient was advised to call back or seek an in-person evaluation if the symptoms worsen or if the condition fails to improve as anticipated.                                                                                           BH MD OP Progress Note  03/21/2024 10:38 PM [name]  MRN:  [id number]  Chief Complaint:  Chief Complaint  Patient presents with   Follow-up   Anxiety   Depression   Medication Refill   Discussed the use of AI scribe software for clinical note transcription with the patient, who gave verbal consent to proceed.  History of Present Illness [name] is a 63 year old Caucasian female on disability, married, currently lives in [place], has a history of bipolar disorder, GAD, primary insomnia, OSA on CPAP, hyperlipidemia, osteoporosis, history of cataract surgery was evaluated by telemedicine today.  She experiences persistent tremors that have been ongoing since her last visit on February 15, 2024. The tremors are variable, with some days being better than others. For instance, she had difficulty holding a glass yesterday, but today her symptoms have improved. She has previously tried propranolol  for the tremors,  but it was discontinued due to uncertainty about its effects and side effects.   She has a history of bipolar disorder and is currently taking Lithium  300 mg twice daily and Wellbutrin  75 mg daily. Her mood symptoms are stable, with no depression, mania, or hypomania.  She has been able to do more activities around the house, was able to go out for dinner with her husband yesterday.  Denies suicidal thoughts or thoughts of harming others. Hydroxyzine  25-50 mg is used at bedtime as needed to aid with sleep, which she describes as adequate.  She has generalized anxiety and continues to see a therapist. She uses a CPAP machine regularly for sleep apnea.  Her thyroid  function is being monitored, and she is currently on Synthroid. Her last lithium  level was 0.66 on Mar 13, 2024, and her renal function was checked on February 05, 2024 and was within normal limits.  She denies any other concerns today.    Visit Diagnosis:    ICD-10-CM   1. Bipolar disorder, in full remission, most recent episode depressed (HCC)  F31.76     2. GAD (generalized anxiety disorder)  F41.1     3. OSA on CPAP  G47.33     4. Tremor  R25.1    Parkinsonian secondary to medications as well as functional.      Past Psychiatric History: I have reviewed past psychiatric history from progress note on 09/01/2021.  Past trials of lithium -side effects of nausea, Ativan-side effect, Depakote , Trileptal-weight gain, Latuda-akathisia, Effexor-made her manic, trazodone, Tranxene, Zoloft, Prozac, BuSpar-did not help, Lamictal -restless, Seroquel , risperidone , Abilify, TCA, modafinil , propranolol -bradycardia.  Past Medical History:  Past Medical History:  Diagnosis Date   Bipolar 1 disorder (HCC)    Hyperlipidemia    OSA (obstructive sleep apnea)    CPAP dependent   Osteoporosis    PPD positive     Past Surgical History:  Procedure Laterality Date   ABDOMINAL HYSTERECTOMY     CATARACT EXTRACTION, BILATERAL     due to seroquel     ROBOTIC ASSISTED LAPAROSCOPIC HYSTERECTOMY AND SALPINGECTOMY      Family Psychiatric History: I have reviewed family psychiatric history from progress note on 09/01/2021.  Family History:  Family History  Problem Relation Age of Onset   Personality disorder Mother    Tremor Mother    Alcohol abuse Maternal Uncle    Tremor Maternal Grandfather    Bipolar disorder Cousin    Schizophrenia Other    Breast cancer Neg Hx     Social History: I have reviewed social history from progress note on 09/01/2021. Social History   Socioeconomic History   Marital status: Married    Spouse name: eric   Number of children: 2   Years of education: Not on file   Highest education level: Bachelor's degree (e.g., BA, AB, BS)  Occupational History    Comment: unemployed  Tobacco Use   Smoking status: Never   Smokeless tobacco: Never  Vaping Use   Vaping status: Never Used  Substance and Sexual Activity   Alcohol use: No   Drug use: No   Sexual activity: Yes  Other Topics Concern   Not on file  Social History Narrative   Right handed   Dosnt work    Social Drivers of Corporate investment banker Strain: Not on file  Food Insecurity: Not on file  Transportation Needs: Not on file  Physical Activity: Not on file  Stress: Not on file  Social Connections: Unknown (03/07/2022)   [facility]   Social Network    Social Network: Not on file    Allergies:  Allergies  Allergen Reactions   Tyloxapol Rash   Prilosec [Omeprazole]     Interacts with psych meds   Propranolol  Nausea And Vomiting   Tylox [Oxycodone-Acetaminophen ] Rash    Metabolic Disorder Labs: No results found for: "HGBA1C", "MPG" Lab Results  Component Value Date   PROLACTIN 5.1 09/01/2021   No results found for: "CHOL", "TRIG", "HDL", "CHOLHDL", "VLDL", "LDLCALC" Lab Results  Component Value Date   TSH 3.652 12/06/2023   TSH 0.955 09/01/2021    Therapeutic Level Labs: Lab  Results  Component Value Date   LITHIUM  0.66 03/13/2024   LITHIUM  1.01 02/05/2024   Lab Results  Component Value Date   VALPROATE 89 05/24/2023   VALPROATE 69 01/17/2023   No results found for: "CBMZ"  Current Medications: Current Outpatient Medications  Medication Sig Dispense Refill   alendronate (FOSAMAX) 70 MG tablet TAKE 1 TABLET BY MOUTH ONCE WEEKLY 30 MINUTES BEFORE FIRST FOOD, MEDICINE, OR WATER OF THE DAY     buPROPion  (WELLBUTRIN ) 75 MG tablet TAKE 1 TABLET(75 MG) BY MOUTH IN  THE MORNING 90 tablet 1   hydrOXYzine  (ATARAX ) 25 MG tablet Take 1-2 tablets (25-50 mg total) by mouth at bedtime as needed. For sleep, anxiety 180 tablet 3   levothyroxine (SYNTHROID) 25 MCG tablet Take 25 mcg by mouth daily before breakfast.     lithium  carbonate (LITHOBID ) 300 MG ER tablet TAKE 1 TABLET(300 MG) BY MOUTH TWICE DAILY 60 tablet 1   Multiple Vitamin (MULTIVITAMIN) tablet Take 1 tablet by mouth daily.     ondansetron  (ZOFRAN ) 4 MG tablet Take 1 tablet (4 mg total) by mouth every 8 (eight) hours as needed for nausea or vomiting. 10 tablet 0   simvastatin (ZOCOR) 40 MG tablet Take 40 mg by mouth daily.     valACYclovir (VALTREX) 500 MG tablet 1 tablet Orally Twice a day as needed for outbreak     No current facility-administered medications for this visit.     Musculoskeletal: Strength & Muscle Tone: UTA Gait & Station: Seated Patient leans: N/A  Psychiatric Specialty Exam: Review of Systems  Neurological:  Positive for tremors.  Psychiatric/Behavioral:  The patient is nervous/anxious.     There were no vitals taken for this visit.There is no height or weight on file to calculate BMI.  General Appearance: Casual  Eye Contact:  Fair  Speech:  Clear and Coherent  Volume:  Normal  Mood:  Anxious  Affect:  Appropriate  Thought Process:  Goal Directed and Descriptions of Associations: Intact  Orientation:  Full (Time, Place, and Person)  Thought Content: Logical   Suicidal  Thoughts:  No  Homicidal Thoughts:  No  Memory:  Immediate;   Fair Recent;   Fair Remote;   Fair  Judgement:  Fair  Insight:  Fair  Psychomotor Activity:  Normal  Concentration:  Concentration: Fair and Attention Span: Fair  Recall:  [employer] of Knowledge: Fair  Language: Fair  Akathisia:  No  Handed:  Right  AIMS (if indicated): not done  Assets:  Communication Skills Desire for Improvement Housing Social Support Talents/Skills  ADL's:  Intact  Cognition: WNL  Sleep:  Fair   Screenings: Midwife Visit from 05/15/2023 in [facility] Office Visit from 02/01/2023 in [facility] Office Visit from 01/10/2023 in [facility] Office Visit from 12/22/2022 in [facility] Office Visit from 10/26/2022 in [facility]  AIMS Total Score 0 0 0 0 0      GAD-7    Flowsheet Row Office Visit from 01/15/2024 in [facility] Office Visit from 05/15/2023 in [facility] Office Visit from 03/13/2023 in [facility] Office Visit from 02/01/2023 in [facility] Office Visit from 01/10/2023 in [facility]  Total GAD-7 Score 11 13 16 7 13       PHQ2-9    Flowsheet Row Office Visit from 01/15/2024 in [facility] Video Visit from 01/04/2024 in [facility] Video Visit from 07/25/2023 in [facility] Office Visit from 05/15/2023 in [facility] Office Visit from 03/13/2023 in [facility]  PHQ-2 Total  Score 3 4 2 6 5   PHQ-9 Total Score 14 16 11 17 21       Flowsheet Row Video  Visit from 03/21/2024 in [facility] Video Visit from 02/15/2024 in [facility] ED from 02/05/2024 in [facility] Emergency Department at [facility]  C-SSRS RISK CATEGORY Moderate Risk Moderate Risk No Risk        Assessment and Plan: [name] is a 63 year old Caucasian female, married, disabled, lives in [place], has a history of bipolar disorder, anxiety disorder, tremors, obstructive sleep apnea was evaluated by telemedicine today.  Discussed assessment and plan as noted below.  Bipolar disorder most recent episode depressed in remission Currently reports mood symptoms are stable on the current medication regimen. - Continue Lithium  600 mg daily.  - Reviewed and discussed most recent Lithium  level dated 03/13/2024-0.66-therapeutic. - Continue Wellbutrin  75 mg daily  Generalized anxiety disorder-stable Currently anxiety is manageable although she continues to be anxious about her comorbid traumas. - Continue psychotherapy sessions with Dr. Peterson Brandt - Continue Hydroxyzine  25 mg take 2 tablets at night  Sleep apnea-improving Currently reports sleep quality is improving. - Continue CPAP therapy.  Tremors-unstable Persistent tremors continues to affect daily activities.  Currently it varies from day to day. - Discussed Amantadine low dose. - Consider discontinuing Lithium  in the future however patient with history of treatment resistant depression. - Continue follow-up with neurology.   Follow-up Follow-up in clinic in 4 weeks or sooner if needed.  Collaboration of Care: Collaboration of Care: Referral or follow-up with counselor/therapist AEB encouraged to continue follow-up with therapist.  Patient/Guardian was advised Release of Information must be obtained prior to any record release in order to  collaborate their care with an outside provider. Patient/Guardian was advised if they have not already done so to contact the registration department to sign all necessary forms in order for us  to release information regarding their care.   Consent: Patient/Guardian gives verbal consent for treatment and assignment of benefits for services provided during this visit. Patient/Guardian expressed understanding and agreed to proceed.   This note was generated in part or whole with voice recognition software. Voice recognition is usually quite accurate but there are transcription errors that can and very often do occur. I apologize for any typographical errors that were not detected and corrected.    Mahreen Schewe, MD 03/21/2024, 10:38 PM

## 2024-03-21 NOTE — Patient Instructions (Signed)
Amantadine Capsules or Tablets What is this medication? AMANTADINE (a MAN ta deen) prevents and treats infections caused by the flu virus (influenza). It works by slowing the spread of the flu virus in your body and reducing how long your symptoms last. It will not treat colds or infections caused by bacteria or other viruses. It will not replace the annual flu vaccine. It may also be used to treat movement disorders, including those caused by Parkinson disease and some medications. It works by balancing substances in your brain that help manage body movements and coordination. This reduces symptoms, such as body stiffness and tremors. This medicine may be used for other purposes; ask your health care provider or pharmacist if you have questions. COMMON BRAND NAME(S): Symmetrel What should I tell my care team before I take this medication? They need to know if you have any of these conditions: Depression Eczema Feel sleepy or have fallen asleep suddenly during the day Frequently drink alcohol Glaucoma Heart failure Kidney disease Low blood pressure Schizophrenia Seizures Sleep apnea Suicidal thoughts, plans, or attempt by you or a family member An unusual or allergic reaction to amantadine, other medications, foods, dyes, or preservatives Pregnant or trying to get pregnant Breast-feeding How should I use this medication? Take this medication by mouth with water. Take it as directed on the prescription label at the same time every day. You can take it with or without food. If it upsets your stomach, take it with food. Take all of this medication unless your care team tells you to stop it early. Keep taking it even if you think you are better. Talk to your care team about the use of this medication in children. While it may be prescribed for children for selected conditions, precautions do apply. People over 65 years of age may have a stronger reaction and need a smaller dose. Overdosage: If  you think you have taken too much of this medicine contact a poison control center or emergency room at once. NOTE: This medicine is only for you. Do not share this medicine with others. What if I miss a dose? If you miss a dose, take it as soon as you can. If it is almost time for your next dose, take only that dose. Do not take double or extra doses. What may interact with this medication? Acetazolamide Alcohol Antihistamines for allergy, cough, and cold Atropine Bupropion Certain medications for bladder problems, such as oxybutynin or tolterodine Certain medications for Parkinson disease, such as benztropine or trihexyphenidyl Certain medications for stomach problems, such as dicyclomine or hyoscyamine Certain medications for travel sickness, such as scopolamine Ipratropium Methazolamide Quinidine Quinine Sodium bicarbonate Some flu vaccines Thioridazine This list may not describe all possible interactions. Give your health care provider a list of all the medicines, herbs, non-prescription drugs, or dietary supplements you use. Also tell them if you smoke, drink alcohol, or use illegal drugs. Some items may interact with your medicine. What should I watch for while using this medication? Visit your care team for regular checks on your progress. Tell your care team if your symptoms do not start to get better or if they get worse. A severe reaction similar to neuroleptic malignant syndrome (NMS) may occur if you reduce the dose of or stop taking this medication too quickly. Symptoms of NMS include high fever, stiff muscles, increased sweating, fast or irregular heartbeat, and confusion. Contact your care team right away if think you have NMS. This medication may affect your coordination,  reaction time, or judgment. Do not drive or operate machinery until you know how this medication affects you. Sit up or stand slowly to reduce the risk of dizzy or fainting spells. Drinking alcohol with  this medication can increase the risk of these side effects. When taking this medication, you may fall asleep without notice. You may be doing activities, such as driving a car, talking, or eating. You may not feel drowsy before it happens. Contact your care team right away if this happens to you. There have been reports of increased sexual urges or other strong urges, such as gambling while taking this medication. If you experience any of these while taking this medication, you should report this to your care team as soon as possible. This medication may cause dry eyes and blurred vision. If you wear contact lenses, you may feel some discomfort. Lubricating eye drops may help. See your care team if the problem does not go away or is severe. Your mouth may get dry. Chewing sugarless gum or sucking hard candy and drinking plenty of water may help. Contact your care team if the problem does not go away or is severe. Talk to your care team about your risk of skin cancer. You may be more at risk for skin cancer if you take this medication. What side effects may I notice from receiving this medication? Side effects that you should report to your care team as soon as possible: Allergic reactions--skin rash, itching, hives, swelling of the face, lips, tongue, or throat Falling asleep during daily activities Low blood pressure--dizziness, feeling faint or lightheaded, blurry vision Mood and behavior changes--anxiety, nervousness, confusion, hallucinations, irritability, hostility, thoughts of suicide or self-harm, worsening mood, feelings of depression Trouble passing urine Urges to engage in impulsive behaviors such as gambling, binge eating, sexual activity, or shopping in ways that are unusual for you Side effects that usually do not require medical attention (report these to your care team if they continue or are bothersome): Blurry vision Confusion Constipation Dizziness Dry mouth Swelling of the  ankles, hands, or feet Trouble sleeping This list may not describe all possible side effects. Call your doctor for medical advice about side effects. You may report side effects to FDA at 1-800-FDA-1088. Where should I keep my medication? Keep out of the reach of children and pets. Store at room temperature between 20 and 25 degrees C (68 and 77 degrees F). Keep the container tightly closed. Get rid of any unused medication after the expiration date. To get rid of medications that are no longer needed or have expired: Take the medication to a medication take-back program. Check with your pharmacy or law enforcement to find a location. If you cannot return the medication, check the label or package insert to see if the medication should be thrown out in the garbage or flushed down the toilet. If you are not sure, ask your care team. If it is safe to put it in the trash, pour the medication out of the container. Mix the medication with cat litter, dirt, coffee grounds, or other unwanted substance. Seal the mixture in a bag or container. Put it in the trash. NOTE: This sheet is a summary. It may not cover all possible information. If you have questions about this medicine, talk to your doctor, pharmacist, or health care provider.  2024 [employer]vier/Gold Standard (2022-02-18 00:00:00)

## 2024-03-22 DIAGNOSIS — R251 Tremor, unspecified: Secondary | ICD-10-CM

## 2024-03-23 MED ORDER — AMANTADINE HCL 100 MG PO CAPS: 100.0000 mg | ORAL_CAPSULE | Freq: Every day | ORAL | 0 refills | Status: DC

## 2024-03-27 DIAGNOSIS — F411 Generalized anxiety disorder: Secondary | ICD-10-CM | POA: Diagnosis not present

## 2024-03-27 DIAGNOSIS — E039 Hypothyroidism, unspecified: Secondary | ICD-10-CM | POA: Diagnosis not present

## 2024-04-19 ENCOUNTER — Other Ambulatory Visit: Payer: Self-pay | Admitting: Psychiatry

## 2024-04-19 DIAGNOSIS — R251 Tremor, unspecified: Secondary | ICD-10-CM

## 2024-04-19 DIAGNOSIS — F3132 Bipolar disorder, current episode depressed, moderate: Secondary | ICD-10-CM

## 2024-04-25 DIAGNOSIS — W548XXA Other contact with dog, initial encounter: Secondary | ICD-10-CM | POA: Diagnosis not present

## 2024-04-25 DIAGNOSIS — D489 Neoplasm of uncertain behavior, unspecified: Secondary | ICD-10-CM | POA: Diagnosis not present

## 2024-04-25 DIAGNOSIS — L570 Actinic keratosis: Secondary | ICD-10-CM | POA: Diagnosis not present

## 2024-04-25 DIAGNOSIS — S50812A Abrasion of left forearm, initial encounter: Secondary | ICD-10-CM | POA: Diagnosis not present

## 2024-05-02 DIAGNOSIS — F3181 Bipolar II disorder: Secondary | ICD-10-CM | POA: Diagnosis not present

## 2024-05-15 ENCOUNTER — Ambulatory Visit: Admitting: Psychiatry

## 2024-05-15 ENCOUNTER — Other Ambulatory Visit: Payer: Self-pay

## 2024-05-15 ENCOUNTER — Encounter: Payer: Self-pay | Admitting: Psychiatry

## 2024-05-15 VITALS — BP 136/89 | HR 65 | Temp 98.0°F | Ht 65.0 in | Wt 153.2 lb

## 2024-05-15 DIAGNOSIS — F3176 Bipolar disorder, in full remission, most recent episode depressed: Secondary | ICD-10-CM

## 2024-05-15 DIAGNOSIS — R251 Tremor, unspecified: Secondary | ICD-10-CM

## 2024-05-15 DIAGNOSIS — G4733 Obstructive sleep apnea (adult) (pediatric): Secondary | ICD-10-CM | POA: Diagnosis not present

## 2024-05-15 DIAGNOSIS — F411 Generalized anxiety disorder: Secondary | ICD-10-CM

## 2024-05-15 MED ORDER — AMANTADINE HCL 100 MG PO CAPS: 100.0000 mg | ORAL_CAPSULE | Freq: Two times a day (BID) | ORAL | 1 refills | Status: DC

## 2024-05-15 NOTE — Progress Notes (Signed)
 BH MD OP Progress Note  05/15/2024 11:33 AM [name]  MRN:  [id number]  Chief Complaint:  Chief Complaint  Patient presents with   Follow-up   Anxiety   Medication Refill   Depression    Discussed the use of AI scribe software for clinical note transcription with the patient, who gave verbal consent to proceed.  History of Present Illness [name] is a 62 year old Caucasian female, on disability, married, currently lives in [place], has a history of bipolar disorder, GAD, primary insomnia, OSA on CPAP, hyperlipidemia, osteoporosis, history of cataract surgery was evaluated in office today for a follow-up appointment.  Her tremors have shown improvement since the last visit, allowing her to perform tasks. She is currently taking amantadine  100 mg daily, which she believes has helped, particularly with her walking. She is able to feed herself better now, although she still experiences some difficulty with utensils.  She is experiencing anxiety related to a recent financial decision made by her husband without her consent, which involved using her inheritance to pay off her house. This has caused significant emotional distress and trust issues in her marriage. She is currently in therapy to address these issues.  No significant manic or depressive symptoms are present, and her sleep and appetite are stable.  She does have hydroxyzine  available as needed for sleep although she has not been using it.  Her current medications include lithium  600 mg, Wellbutrin  75 mg, hydroxyzine  as needed, amantadine . She reports no new medications and no issues with her current regimen.    Visit Diagnosis:    ICD-10-CM   1. Bipolar disorder, in full remission, most recent episode depressed (HCC)  F31.76    Type I    2. GAD (generalized anxiety disorder)  F41.1     3. OSA on CPAP  G47.33     4. Tremor  R25.1 amantadine  (SYMMETREL ) 100 MG capsule   Parkinsonian secondary to  medications as well as functional      Past Psychiatric History: I have reviewed past psychiatric history from progress note on 09/01/2021 as well as 03/21/2024.  Past Medical History:  Past Medical History:  Diagnosis Date   Bipolar 1 disorder (HCC)    Hyperlipidemia    OSA (obstructive sleep apnea)    CPAP dependent   Osteoporosis    PPD positive     Past Surgical History:  Procedure Laterality Date   ABDOMINAL HYSTERECTOMY     CATARACT EXTRACTION, BILATERAL     due to seroquel    ROBOTIC ASSISTED LAPAROSCOPIC HYSTERECTOMY AND SALPINGECTOMY      Family Psychiatric History: I have reviewed family psychiatric history from progress note on 09/01/2021  Family History:  Family History  Problem Relation Age of Onset   Personality disorder Mother    Tremor Mother    Alcohol abuse Maternal Uncle    Tremor Maternal Grandfather    Bipolar disorder Cousin    Schizophrenia Other    Breast cancer Neg Hx     Social History: I have reviewed social history from progress note on 09/01/2021 Social History   Socioeconomic History   Marital status: Married    Spouse name: eric   Number of children: 2   Years of education: Not on file   Highest education level: Bachelor's degree (e.g., BA, AB, BS)  Occupational History    Comment: unemployed  Tobacco Use   Smoking status: Never   Smokeless tobacco: Never  Vaping Use   Vaping status:  Never Used  Substance and Sexual Activity   Alcohol use: No   Drug use: No   Sexual activity: Yes  Other Topics Concern   Not on file  Social History Narrative   Right handed   Dosnt work    Social Drivers of Corporate investment banker Strain: Not on file  Food Insecurity: Not on file  Transportation Needs: Not on file  Physical Activity: Not on file  Stress: Not on file  Social Connections: Unknown (03/07/2022)   Received from [facility]   Social Network    Social Network: Not on file    Allergies:  Allergies  Allergen Reactions    Tyloxapol Rash   Prilosec [Omeprazole]     Interacts with psych meds   Propranolol  Nausea And Vomiting   Tylox [Oxycodone-Acetaminophen ] Rash    Metabolic Disorder Labs: No results found for: HGBA1C, MPG Lab Results  Component Value Date   PROLACTIN 5.1 09/01/2021   No results found for: CHOL, TRIG, HDL, CHOLHDL, VLDL, LDLCALC Lab Results  Component Value Date   TSH 3.652 12/06/2023   TSH 0.955 09/01/2021    Therapeutic Level Labs: Lab Results  Component Value Date   LITHIUM  0.66 03/13/2024   LITHIUM  1.01 02/05/2024   Lab Results  Component Value Date   VALPROATE 89 05/24/2023   VALPROATE 69 01/17/2023   No results found for: CBMZ  Current Medications: Current Outpatient Medications  Medication Sig Dispense Refill   alendronate (FOSAMAX) 70 MG tablet TAKE 1 TABLET BY MOUTH ONCE WEEKLY 30 MINUTES BEFORE FIRST FOOD, MEDICINE, OR WATER OF THE DAY     amantadine  (SYMMETREL ) 100 MG capsule Take 1 capsule (100 mg total) by mouth 2 (two) times daily. 60 capsule 1   buPROPion  (WELLBUTRIN ) 75 MG tablet TAKE 1 TABLET(75 MG) BY MOUTH IN THE MORNING 90 tablet 1   hydrOXYzine  (ATARAX ) 25 MG tablet Take 1-2 tablets (25-50 mg total) by mouth at bedtime as needed. For sleep, anxiety 180 tablet 3   levothyroxine (SYNTHROID) 25 MCG tablet Take 25 mcg by mouth daily before breakfast.     lithium  carbonate (LITHOBID ) 300 MG ER tablet TAKE 1 TABLET(300 MG) BY MOUTH TWICE DAILY 60 tablet 2   Multiple Vitamin (MULTIVITAMIN) tablet Take 1 tablet by mouth daily.     ondansetron  (ZOFRAN ) 4 MG tablet Take 1 tablet (4 mg total) by mouth every 8 (eight) hours as needed for nausea or vomiting. 10 tablet 0   simvastatin (ZOCOR) 40 MG tablet Take 40 mg by mouth daily.     valACYclovir (VALTREX) 500 MG tablet 1 tablet Orally Twice a day as needed for outbreak     No current facility-administered medications for this visit.     Musculoskeletal: Strength & Muscle Tone: within  normal limits Gait & Station: normal Patient leans: N/A  Psychiatric Specialty Exam: Review of Systems  Neurological:  Positive for tremors.  Psychiatric/Behavioral:  The patient is nervous/anxious.     Blood pressure 136/89, pulse 65, temperature 98 F (36.7 C), temperature source Temporal, height 5' 5 (1.651 m), weight 153 lb 3.2 oz (69.5 kg).Body mass index is 25.49 kg/m.  General Appearance: Casual  Eye Contact:  Good  Speech:  Normal Rate  Volume:  Normal  Mood:  Anxious  Affect:  Congruent  Thought Process:  Goal Directed and Descriptions of Associations: Intact  Orientation:  Full (Time, Place, and Person)  Thought Content: Logical   Suicidal Thoughts:  No  Homicidal Thoughts:  No  Memory:  Immediate;   Fair Recent;   Fair Remote;   Fair  Judgement:  Fair  Insight:  Fair  Psychomotor Activity:  Tremor  Concentration:  Concentration: Fair and Attention Span: Fair  Recall:  [employer] of Knowledge: Fair  Language: Fair  Akathisia:  No  Handed:  Right  AIMS (if indicated): done  Assets:  Communication Skills Desire for Improvement Housing Intimacy Talents/Skills Transportation  ADL's:  Intact  Cognition: WNL  Sleep:  Fair   Screenings: Midwife Visit from 05/15/2024 in [facility] Office Visit from 05/15/2023 in [facility] Office Visit from 02/01/2023 in [facility] Office Visit from 01/10/2023 in [facility] Office Visit from 12/22/2022 in [facility]  AIMS Total Score 0 0 0 0 0   GAD-7    Flowsheet Row Office Visit from 05/15/2024 in [facility] Office Visit from 01/15/2024 in [facility] Office Visit from 05/15/2023 in [facility] Office Visit from 03/13/2023 in [facility] Office Visit from 02/01/2023 in [facility]  Total GAD-7 Score 15 11 13 16 7    PHQ2-9    Flowsheet Row Office Visit from 05/15/2024 in [facility] Office Visit from 01/15/2024 in [facility] Video Visit from 01/04/2024 in [facility] Video Visit from 07/25/2023 in [facility] Office Visit from 05/15/2023 in [facility]  PHQ-2 Total Score 2 3 4 2 6   PHQ-9 Total Score 8 14 16 11 17    Flowsheet Row Office Visit from 05/15/2024 in [facility] Video Visit from 03/21/2024 in [facility] Video Visit from 02/15/2024 in [facility]  C-SSRS RISK CATEGORY Moderate Risk Moderate Risk Moderate Risk     Assessment and Plan: JISELL MAJER is a 63 year old Caucasian female, married, disabled, lives in [place], has a history of bipolar disorder, anxiety disorder, traumas, obstructive sleep apnea was evaluated in office today.  Discussed assessment and plan as noted below.  Bipolar disorder most recent episode depressed in remission Currently denies any depression or manic symptoms Continue Lithium  600 mg daily Most recent Lithium  level-03/13/2024-0.66-therapeutic Continue Wellbutrin  75 mg daily.  Generalized anxiety disorder-stable Does have anxiety mostly situational although managing it well. Continue psychotherapy sessions with Dr. Nat. Continue Hydroxyzine  25 mg up to 2 tablets at night as needed  Sleep apnea-improving Currently reports sleep quality is improving.  Denies any concerns. Continue CPAP therapy.  Tremors-improving Persistent  tremors continues to affect daily activities although with improvement.  Good response to amantadine  and denies side effects. Increase Amantadine  to 100 mg twice daily Provided medication education, patient to monitor for side effects. Continue follow-up with neurology.  Follow-up Follow-up in clinic in 2 months or sooner if needed.   Collaboration of Care: Collaboration of Care: Referral or follow-up with counselor/therapist AEB encouraged to continue psychotherapy sessions.  Patient/Guardian was advised Release of Information must be obtained prior to any record release in order to collaborate their care with an outside provider. Patient/Guardian was advised if they have not already done so to contact the registration department to sign all necessary forms in order  for us  to release information regarding their care.   Consent: Patient/Guardian gives verbal consent for treatment and assignment of benefits for services provided during this visit. Patient/Guardian expressed understanding and agreed to proceed.  This note was generated in part or whole with voice recognition software. Voice recognition is usually quite accurate but there are transcription errors that can and very often do occur. I apologize for any typographical errors that were not detected and corrected.     Garnie Borchardt, MD 05/15/2024, 11:33 AM

## 2024-05-21 DIAGNOSIS — F3181 Bipolar II disorder: Secondary | ICD-10-CM | POA: Diagnosis not present

## 2024-06-04 DIAGNOSIS — F411 Generalized anxiety disorder: Secondary | ICD-10-CM | POA: Diagnosis not present

## 2024-06-12 ENCOUNTER — Other Ambulatory Visit: Payer: Self-pay | Admitting: Physician Assistant

## 2024-06-12 ENCOUNTER — Ambulatory Visit
Admission: RE | Admit: 2024-06-12 | Discharge: 2024-06-12 | Disposition: A | Discharge status: Home / Self Care | Source: Ambulatory Visit | Attending: Physician Assistant | Admitting: Physician Assistant

## 2024-06-12 DIAGNOSIS — R0602 Shortness of breath: Secondary | ICD-10-CM | POA: Diagnosis not present

## 2024-06-12 DIAGNOSIS — M810 Age-related osteoporosis without current pathological fracture: Secondary | ICD-10-CM | POA: Diagnosis not present

## 2024-06-12 DIAGNOSIS — R600 Localized edema: Secondary | ICD-10-CM | POA: Diagnosis not present

## 2024-06-12 DIAGNOSIS — F319 Bipolar disorder, unspecified: Secondary | ICD-10-CM | POA: Diagnosis not present

## 2024-06-12 DIAGNOSIS — R251 Tremor, unspecified: Secondary | ICD-10-CM | POA: Diagnosis not present

## 2024-06-18 DIAGNOSIS — F3181 Bipolar II disorder: Secondary | ICD-10-CM | POA: Diagnosis not present

## 2024-06-19 ENCOUNTER — Telehealth: Payer: Self-pay

## 2024-06-19 NOTE — Telephone Encounter (Signed)
 pt left a message states that she having feet swelling, nauses feeling, tired all the time. she wanted to know if any of her medications would cause this.

## 2024-06-19 NOTE — Telephone Encounter (Signed)
 Contacted patient, she reports she is having nausea feeling tired and having peripheral edema, her primary care provider is aware and worked her up and she was prescribed hydrochlorothiazide.  She continues to struggle.  She wonders if her psychotropics likely causing this.  Discussed with patient her medications like lithium , amantadine  could cause similar side effects.  She tried reducing the amantadine  dosage 2 weeks ago and that did not help.  Patient agreeable to call her primary care provider for further evaluation prior to stopping her psychotropics further.  Patient advised to call us  back with any further concerns.

## 2024-06-20 DIAGNOSIS — R600 Localized edema: Secondary | ICD-10-CM | POA: Diagnosis not present

## 2024-06-20 DIAGNOSIS — R5383 Other fatigue: Secondary | ICD-10-CM | POA: Diagnosis not present

## 2024-06-20 DIAGNOSIS — R197 Diarrhea, unspecified: Secondary | ICD-10-CM | POA: Diagnosis not present

## 2024-06-20 DIAGNOSIS — R110 Nausea: Secondary | ICD-10-CM | POA: Diagnosis not present

## 2024-06-29 ENCOUNTER — Ambulatory Visit: Admission: RE | Admit: 2024-06-29 | Discharge: 2024-06-29 | Disposition: A | Discharge status: Home / Self Care

## 2024-06-29 VITALS — BP 145/78 | HR 70 | Temp 98.7°F | Resp 18

## 2024-06-29 DIAGNOSIS — M5442 Lumbago with sciatica, left side: Secondary | ICD-10-CM | POA: Diagnosis not present

## 2024-06-29 MED ORDER — METHOCARBAMOL 500 MG PO TABS
500.0000 mg | ORAL_TABLET | Freq: Two times a day (BID) | ORAL | 0 refills | Status: DC | PRN
Start: 2024-06-29 — End: 2024-09-04

## 2024-06-29 NOTE — ED Triage Notes (Signed)
 Patient to Urgent Care with complaints of lower back pain that radiates into her left leg.  Symptoms x3 days. Reports that she tweaked her bac moving around her dog.  Taking 600mg  motrin  Q6/ lidocaine  patches/ heating pad.

## 2024-06-29 NOTE — ED Provider Notes (Signed)
 [name]    CSN: [id number] Arrival date & time: 06/29/24  0846      History   Chief Complaint Chief Complaint  Patient presents with   Back Pain    pulled back muscle and have difficculty getting up and down.  some pain going down left leg - Entered by patient    HPI [name] is a 63 y.o. female.  Accompanied by her husband, patient presents with 3-day history of left lower back pain which radiates down her left leg.  No falls or injury.  She denies numbness, weakness, saddle anesthesia, loss of bowel/bladder control, abdominal pain, flank pain, dysuria, hematuria.  She took ibuprofen  this morning.  She also has been treating it with lidocaine  patch and heating pad.  The history is provided by the patient, the spouse and medical records.    Past Medical History:  Diagnosis Date   Bipolar 1 disorder (HCC)    Hyperlipidemia    OSA (obstructive sleep apnea)    CPAP dependent   Osteoporosis    PPD positive     Patient Active Problem List   Diagnosis Date Noted   Tremor 12/01/2023   Bipolar 1 disorder, depressed, moderate (HCC) 12/01/2023   Fatigue 03/13/2023   Hypomania (HCC) 12/22/2022   Hardening of the aorta (main artery of the heart) (HCC) 12/22/2022   Esophagitis 12/22/2022   Esophageal stenosis 12/22/2022   Prediabetes 01/17/2022   Bipolar disorder, in full remission, most recent episode depressed (HCC) 11/17/2021   Elevated blood pressure reading 09/24/2021   Herpes simplex 09/01/2021   Helicobacter pylori (H. pylori) as the cause of diseases classified elsewhere 09/01/2021   Gastro-esophageal reflux disease without esophagitis 09/01/2021   Moderate bipolar II disorder, depressed, in partial remission, with anxious distress (HCC) 09/01/2021   High risk medication use 09/01/2021   At risk for prolonged QT interval syndrome 09/01/2021   Insomnia 09/01/2021   Mixed obsessional thoughts and acts 06/19/2019   Osteoporosis 12/14/2018    History of electroconvulsive therapy 01/29/2015   Hyperlipidemia 01/09/2015   History of Clostridium difficile 01/09/2015   H/O Clostridium difficile infection 01/09/2015   GAD (generalized anxiety disorder) 12/25/2014   Bipolar 1 disorder (HCC) 12/25/2014   OSA on CPAP 10/04/2001    Past Surgical History:  Procedure Laterality Date   ABDOMINAL HYSTERECTOMY     CATARACT EXTRACTION, BILATERAL     due to seroquel    ROBOTIC ASSISTED LAPAROSCOPIC HYSTERECTOMY AND SALPINGECTOMY      OB History   No obstetric history on file.      Home Medications    Prior to Admission medications   Medication Sig Start Date End Date Taking? Authorizing Provider  alendronate (FOSAMAX) 70 MG tablet TAKE 1 TABLET BY MOUTH ONCE WEEKLY 30 MINUTES BEFORE FIRST FOOD, MEDICINE, OR WATER OF THE DAY 08/10/20  Yes [provider]  amantadine  (SYMMETREL ) 100 MG capsule Take 1 capsule (100 mg total) by mouth 2 (two) times daily. 05/15/24  Yes Eappen, Saramma, MD  buPROPion  (WELLBUTRIN ) 75 MG tablet TAKE 1 TABLET(75 MG) BY MOUTH IN THE MORNING 02/12/24  Yes Okey Barnie SAUNDERS, MD  hydrochlorothiazide (HYDRODIURIL) 25 MG tablet 1 tablet in the morning Orally Once a day for edema/BP; Duration: 30 days 06/12/24  Yes [provider]  levothyroxine (SYNTHROID) 25 MCG tablet Take 25 mcg by mouth daily before breakfast. 02/07/24  Yes [provider]  lithium  carbonate (LITHOBID ) 300 MG ER tablet TAKE 1 TABLET(300 MG) BY MOUTH TWICE DAILY  04/22/24  Yes Eappen, Saramma, MD  methocarbamol  (ROBAXIN ) 500 MG tablet Take 1 tablet (500 mg total) by mouth 2 (two) times daily as needed for muscle spasms. 06/29/24  Yes Corlis Burnard DEL, NP  simvastatin (ZOCOR) 40 MG tablet Take 40 mg by mouth daily.   Yes [provider]  hydrOXYzine  (ATARAX ) 25 MG tablet Take 1-2 tablets (25-50 mg total) by mouth at bedtime as needed. For sleep, anxiety 10/24/23   Eappen, Saramma, MD  Multiple Vitamin (MULTIVITAMIN) tablet  Take 1 tablet by mouth daily.    [provider]  ondansetron  (ZOFRAN ) 4 MG tablet Take 1 tablet (4 mg total) by mouth every 8 (eight) hours as needed for nausea or vomiting. 02/05/24   Harris, Abigail, PA-C  valACYclovir (VALTREX) 500 MG tablet 1 tablet Orally Twice a day as needed for outbreak    [provider]    Family History Family History  Problem Relation Age of Onset   Personality disorder Mother    Tremor Mother    Alcohol abuse Maternal Uncle    Tremor Maternal Grandfather    Bipolar disorder Cousin    Schizophrenia Other    Breast cancer Neg Hx     Social History Social History   Tobacco Use   Smoking status: Never   Smokeless tobacco: Never  Vaping Use   Vaping status: Never Used  Substance Use Topics   Alcohol use: No   Drug use: No     Allergies   Tyloxapol, Prilosec [omeprazole], Propranolol , and Tylox [oxycodone-acetaminophen ]   Review of Systems Review of Systems  Constitutional:  Negative for chills and fever.  Gastrointestinal:  Negative for abdominal pain, constipation, diarrhea, nausea and vomiting.  Genitourinary:  Negative for dysuria, flank pain and hematuria.  Musculoskeletal:  Positive for back pain. Negative for arthralgias, gait problem and joint swelling.  Skin:  Negative for color change, rash and wound.  Neurological:  Negative for weakness and numbness.     Physical Exam Triage Vital Signs ED Triage Vitals  Encounter Vitals Group     BP 06/29/24 0905 (!) 145/78     Girls Systolic BP Percentile --      Girls Diastolic BP Percentile --      Boys Systolic BP Percentile --      Boys Diastolic BP Percentile --      Pulse Rate 06/29/24 0905 70     Resp --      Temp 06/29/24 0905 98.7 F (37.1 C)     Temp src --      SpO2 06/29/24 0905 98 %     Weight --      Height --      Head Circumference --      Peak Flow --      Pain Score 06/29/24 0858 8     Pain Loc --      Pain Education --      Exclude from  Growth Chart --    No data found.  Updated Vital Signs BP (!) 145/78   Pulse 70   Temp 98.7 F (37.1 C)   Resp 18   SpO2 98%   Visual Acuity Right Eye Distance:   Left Eye Distance:   Bilateral Distance:    Right Eye Near:   Left Eye Near:    Bilateral Near:     Physical Exam Constitutional:      General: She is not in acute distress. HENT:     Mouth/Throat:  Mouth: Mucous membranes are moist.  Cardiovascular:     Rate and Rhythm: Normal rate and regular rhythm.  Pulmonary:     Effort: Pulmonary effort is normal. No respiratory distress.  Abdominal:     General: Bowel sounds are normal.     Palpations: Abdomen is soft.     Tenderness: There is no abdominal tenderness. There is no right CVA tenderness, left CVA tenderness, guarding or rebound.  Musculoskeletal:        General: Tenderness present. No swelling, deformity or signs of injury. Normal range of motion.     Comments: Muscular tenderness in left lower back.  No spine tenderness.  Skin:    General: Skin is warm and dry.     Capillary Refill: Capillary refill takes less than 2 seconds.     Findings: No bruising, erythema, lesion or rash.  Neurological:     General: No focal deficit present.     Mental Status: She is alert.     Sensory: No sensory deficit.     Motor: No weakness.     Gait: Gait normal.      UC Treatments / Results  Labs (all labs ordered are listed, but only abnormal results are displayed) Labs Reviewed - No data to display  EKG   Radiology No results found.  Procedures Procedures (including critical care time)  Medications Ordered in UC Medications - No data to display  Initial Impression / Assessment and Plan / UC Course  I have reviewed the triage vital signs and the nursing notes.  Pertinent labs & imaging results that were available during my care of the patient were reviewed by me and considered in my medical decision making (see chart for details).    Acute left  lower back pain with left-sided sciatica.  Afebrile and vital signs are stable.  Patient has been taking ibuprofen  for her discomfort; last dose taken this morning.  Treating today with methocarbamol .  Precautions for drowsiness with methocarbamol  discussed.  Education provided on sciatica.  Instructed her to follow-up with her PCP.  ED precautions given.  She agrees to plan of care.  Final Clinical Impressions(s) / UC Diagnoses   Final diagnoses:  Acute left-sided low back pain with left-sided sciatica     Discharge Instructions      Take ibuprofen  as needed for discomfort.   Take the muscle relaxer as needed for muscle spasm; Do not drive, operate machinery, or drink alcohol with this medication as it can cause drowsiness.   Follow up with your primary care provider.         ED Prescriptions     Medication Sig Dispense Auth. Provider   methocarbamol  (ROBAXIN ) 500 MG tablet Take 1 tablet (500 mg total) by mouth 2 (two) times daily as needed for muscle spasms. 10 tablet Corlis Burnard DEL, NP      I have reviewed the PDMP during this encounter.   Corlis Burnard DEL, NP 06/29/24 [phone]

## 2024-06-29 NOTE — Discharge Instructions (Addendum)
Take ibuprofen as needed for discomfort.  Take the muscle relaxer as needed for muscle spasm; Do not drive, operate machinery, or drink alcohol with this medication as it can cause drowsiness.   Follow up with your primary care provider.

## 2024-06-30 ENCOUNTER — Emergency Department (HOSPITAL_COMMUNITY)

## 2024-06-30 ENCOUNTER — Other Ambulatory Visit: Payer: Self-pay

## 2024-06-30 ENCOUNTER — Emergency Department (HOSPITAL_COMMUNITY)
Admission: EM | Admit: 2024-06-30 | Discharge: 2024-06-30 | Disposition: A | Discharge status: Home / Self Care | Attending: Emergency Medicine | Admitting: Emergency Medicine

## 2024-06-30 ENCOUNTER — Encounter (HOSPITAL_COMMUNITY): Payer: Self-pay | Admitting: Emergency Medicine

## 2024-06-30 DIAGNOSIS — X58XXXA Exposure to other specified factors, initial encounter: Secondary | ICD-10-CM | POA: Diagnosis not present

## 2024-06-30 DIAGNOSIS — S32009A Unspecified fracture of unspecified lumbar vertebra, initial encounter for closed fracture: Secondary | ICD-10-CM

## 2024-06-30 DIAGNOSIS — I700 Atherosclerosis of aorta: Secondary | ICD-10-CM | POA: Diagnosis not present

## 2024-06-30 DIAGNOSIS — M5442 Lumbago with sciatica, left side: Secondary | ICD-10-CM | POA: Insufficient documentation

## 2024-06-30 DIAGNOSIS — E876 Hypokalemia: Secondary | ICD-10-CM | POA: Diagnosis not present

## 2024-06-30 DIAGNOSIS — M47816 Spondylosis without myelopathy or radiculopathy, lumbar region: Secondary | ICD-10-CM | POA: Diagnosis not present

## 2024-06-30 DIAGNOSIS — S32039A Unspecified fracture of third lumbar vertebra, initial encounter for closed fracture: Secondary | ICD-10-CM | POA: Diagnosis not present

## 2024-06-30 DIAGNOSIS — R600 Localized edema: Secondary | ICD-10-CM | POA: Diagnosis not present

## 2024-06-30 DIAGNOSIS — Y9301 Activity, walking, marching and hiking: Secondary | ICD-10-CM | POA: Diagnosis not present

## 2024-06-30 DIAGNOSIS — S3992XA Unspecified injury of lower back, initial encounter: Secondary | ICD-10-CM | POA: Diagnosis not present

## 2024-06-30 DIAGNOSIS — E039 Hypothyroidism, unspecified: Secondary | ICD-10-CM | POA: Insufficient documentation

## 2024-06-30 DIAGNOSIS — M419 Scoliosis, unspecified: Secondary | ICD-10-CM | POA: Diagnosis not present

## 2024-06-30 DIAGNOSIS — M5432 Sciatica, left side: Secondary | ICD-10-CM

## 2024-06-30 LAB — TSH: TSH: 2.521 u[IU]/mL (ref 0.350–4.500)

## 2024-06-30 LAB — CBC WITH DIFFERENTIAL/PLATELET
Abs Immature Granulocytes: 0.05 K/uL (ref 0.00–0.07)
Basophils Absolute: 0 K/uL (ref 0.0–0.1)
Basophils Relative: 0 %
Eosinophils Absolute: 0 K/uL (ref 0.0–0.5)
Eosinophils Relative: 0 %
HCT: 44.6 % (ref 36.0–46.0)
Hemoglobin: 14.1 g/dL (ref 12.0–15.0)
Immature Granulocytes: 0 %
Lymphocytes Relative: 24 %
Lymphs Abs: 2.8 K/uL (ref 0.7–4.0)
MCH: 31.2 pg (ref 26.0–34.0)
MCHC: 31.6 g/dL (ref 30.0–36.0)
MCV: 98.7 fL (ref 80.0–100.0)
Monocytes Absolute: 0.9 K/uL (ref 0.1–1.0)
Monocytes Relative: 8 %
Neutro Abs: 7.9 K/uL — ABNORMAL HIGH (ref 1.7–7.7)
Neutrophils Relative %: 68 %
Platelets: 360 K/uL (ref 150–400)
RBC: 4.52 MIL/uL (ref 3.87–5.11)
RDW: 12 % (ref 11.5–15.5)
WBC: 11.7 K/uL — ABNORMAL HIGH (ref 4.0–10.5)
nRBC: 0 % (ref 0.0–0.2)

## 2024-06-30 LAB — BASIC METABOLIC PANEL WITH GFR
Anion gap: 14 (ref 5–15)
BUN: 15 mg/dL (ref 8–23)
CO2: 22 mmol/L (ref 22–32)
Calcium: 9.7 mg/dL (ref 8.9–10.3)
Chloride: 96 mmol/L — ABNORMAL LOW (ref 98–111)
Creatinine, Ser: 0.9 mg/dL (ref 0.44–1.00)
GFR, Estimated: >60 mL/min (ref >60)
Glucose, Bld: 126 mg/dL — ABNORMAL HIGH (ref 70–99)
Potassium: 3.1 mmol/L — ABNORMAL LOW (ref 3.5–5.1)
Sodium: 132 mmol/L — ABNORMAL LOW (ref 135–145)

## 2024-06-30 LAB — LITHIUM LEVEL: Lithium Lvl: 1.05 mmol/L (ref 0.60–1.20)

## 2024-06-30 LAB — URINALYSIS, ROUTINE W REFLEX MICROSCOPIC
Bacteria, UA: NONE SEEN
Bilirubin Urine: NEGATIVE
Glucose, UA: NEGATIVE mg/dL
Hgb urine dipstick: NEGATIVE
Ketones, ur: NEGATIVE mg/dL
Nitrite: NEGATIVE
Protein, ur: NEGATIVE mg/dL
Specific Gravity, Urine: 1.012 (ref 1.005–1.030)
pH: 6 (ref 5.0–8.0)

## 2024-06-30 MED ORDER — HYDROCODONE-ACETAMINOPHEN 5-325 MG PO TABS: 1.0000 | ORAL_TABLET | ORAL | 0 refills | Status: DC | PRN

## 2024-06-30 MED ORDER — ONDANSETRON HCL 4 MG/2ML IJ SOLN
4.0000 mg | Freq: Once | INTRAMUSCULAR | Status: AC
  Administered 2024-06-30: 4 mg via INTRAVENOUS
  Filled 2024-06-30: qty 2

## 2024-06-30 MED ORDER — DIAZEPAM 5 MG PO TABS: 5.0000 mg | ORAL_TABLET | Freq: Two times a day (BID) | ORAL | 0 refills | Status: DC | PRN

## 2024-06-30 MED ORDER — MORPHINE SULFATE (PF) 4 MG/ML IV SOLN
4.0000 mg | Freq: Once | INTRAVENOUS | Status: AC
  Administered 2024-06-30: 4 mg via INTRAVENOUS
  Filled 2024-06-30: qty 1

## 2024-06-30 MED ORDER — KETOROLAC TROMETHAMINE 30 MG/ML IJ SOLN
30.0000 mg | Freq: Once | INTRAMUSCULAR | Status: AC
  Administered 2024-06-30: 30 mg via INTRAVENOUS
  Filled 2024-06-30: qty 1

## 2024-06-30 MED ORDER — DEXAMETHASONE SODIUM PHOSPHATE 10 MG/ML IJ SOLN
10.0000 mg | Freq: Once | INTRAMUSCULAR | Status: AC
  Administered 2024-06-30: 10 mg via INTRAVENOUS
  Filled 2024-06-30: qty 1

## 2024-06-30 MED ORDER — PREDNISONE 50 MG PO TABS: 50.0000 mg | ORAL_TABLET | Freq: Every day | ORAL | 0 refills | Status: DC

## 2024-06-30 MED ORDER — POTASSIUM CHLORIDE CRYS ER 20 MEQ PO TBCR
40.0000 meq | EXTENDED_RELEASE_TABLET | Freq: Once | ORAL | Status: AC
  Administered 2024-06-30: 40 meq via ORAL
  Filled 2024-06-30: qty 2

## 2024-06-30 NOTE — ED Triage Notes (Signed)
 Pt pulled back on Wed trying to step around her sleeping dog and has lower back pain shooting down left leg. Has tried to manage it with Ibuprofen  and heat at home. Seen at [facility] in [place] yesterday, given Robaxin . She feels like it is getting worse. Having trouble standing and walking.

## 2024-06-30 NOTE — ED Provider Notes (Signed)
 [place] EMERGENCY DEPARTMENT AT [facility] Provider Note   CSN: [id number] Arrival date & time: 06/30/24  1022     Patient presents with: Back Pain   [name] is a 63 y.o. female.   Pt is a 63 yo female with pmhx significant for bipolar d/o, hld, sleep apnea, osteoporosis, and hypothyroidism.  Pt was trying to step around her sleeping dog on Wednesday (9/3) and felt immediate pain in her left lower back with pain radiating down the left leg.  She tried ibuprofen , but that was not working, so she went to [facility] on 9/5.  She was given a rx for robaxin , but that is not helping either.  She is having trouble moving, standing, and walking.  She denies any urinary or bowel incontinence.  She has had some swelling in her lower legs and pcp put her on hydrochlorothiazide and referred her to cards.         Prior to Admission medications   Medication Sig Start Date End Date Taking? Authorizing Provider  diazepam  (VALIUM ) 5 MG tablet Take 1 tablet (5 mg total) by mouth 2 (two) times daily as needed for anxiety. 06/30/24  Yes Dean Clarity, MD  HYDROcodone -acetaminophen  (NORCO /VICODIN) 5-325 MG tablet Take 1 tablet by mouth every 4 (four) hours as needed. 06/30/24  Yes Dean Clarity, MD  predniSONE  (DELTASONE ) 50 MG tablet Take 1 tablet (50 mg total) by mouth daily with breakfast. 06/30/24  Yes Kailah Pennel, MD  alendronate (FOSAMAX) 70 MG tablet TAKE 1 TABLET BY MOUTH ONCE WEEKLY 30 MINUTES BEFORE FIRST FOOD, MEDICINE, OR WATER OF THE DAY 08/10/20   [provider]  amantadine  (SYMMETREL ) 100 MG capsule Take 1 capsule (100 mg total) by mouth 2 (two) times daily. 05/15/24   Eappen, Saramma, MD  buPROPion  (WELLBUTRIN ) 75 MG tablet TAKE 1 TABLET(75 MG) BY MOUTH IN THE MORNING 02/12/24   Okey Barnie SAUNDERS, MD  hydrochlorothiazide (HYDRODIURIL) 25 MG tablet 1 tablet in the morning Orally Once a day for edema/BP; Duration: 30 days 06/12/24   [provider]  hydrOXYzine   (ATARAX ) 25 MG tablet Take 1-2 tablets (25-50 mg total) by mouth at bedtime as needed. For sleep, anxiety 10/24/23   Eappen, Saramma, MD  levothyroxine (SYNTHROID) 25 MCG tablet Take 25 mcg by mouth daily before breakfast. 02/07/24   [provider]  lithium  carbonate (LITHOBID ) 300 MG ER tablet TAKE 1 TABLET(300 MG) BY MOUTH TWICE DAILY 04/22/24   Eappen, Saramma, MD  methocarbamol  (ROBAXIN ) 500 MG tablet Take 1 tablet (500 mg total) by mouth 2 (two) times daily as needed for muscle spasms. 06/29/24   Corlis Burnard DEL, NP  Multiple Vitamin (MULTIVITAMIN) tablet Take 1 tablet by mouth daily.    [provider]  ondansetron  (ZOFRAN ) 4 MG tablet Take 1 tablet (4 mg total) by mouth every 8 (eight) hours as needed for nausea or vomiting. 02/05/24   Harris, Abigail, PA-C  simvastatin (ZOCOR) 40 MG tablet Take 40 mg by mouth daily.    [provider]  valACYclovir (VALTREX) 500 MG tablet 1 tablet Orally Twice a day as needed for outbreak    [provider]    Allergies: Tyloxapol, Prilosec [omeprazole], Propranolol , and Tylox [oxycodone-acetaminophen ]    Review of Systems  Musculoskeletal:  Positive for back pain.       Left leg pain  All other systems reviewed and are negative.   Updated Vital Signs BP (!) 151/82 (BP Location: Left Arm)   Pulse 69  Temp 98.2 F (36.8 C) (Oral)   Resp 18   SpO2 100%   Physical Exam Vitals and nursing note reviewed.  Constitutional:      Appearance: Normal appearance.  HENT:     Head: Normocephalic and atraumatic.     Right Ear: External ear normal.     Left Ear: External ear normal.     Nose: Nose normal.     Mouth/Throat:     Mouth: Mucous membranes are moist.     Pharynx: Oropharynx is clear.  Eyes:     Extraocular Movements: Extraocular movements intact.     Conjunctiva/sclera: Conjunctivae normal.     Pupils: Pupils are equal, round, and reactive to light.  Cardiovascular:     Rate and Rhythm: Normal rate and  regular rhythm.     Pulses: Normal pulses.     Heart sounds: Normal heart sounds.  Pulmonary:     Effort: Pulmonary effort is normal.     Breath sounds: Normal breath sounds.  Abdominal:     General: Abdomen is flat. Bowel sounds are normal.     Palpations: Abdomen is soft.  Musculoskeletal:     Cervical back: Normal range of motion and neck supple.     Lumbar back: Decreased range of motion. Positive left straight leg raise test.     Right lower leg: Edema present.     Left lower leg: Edema present.  Skin:    General: Skin is warm.     Capillary Refill: Capillary refill takes less than 2 seconds.  Neurological:     General: No focal deficit present.     Mental Status: She is alert and oriented to person, place, and time.     Comments: Baseline diffuse tremor  Psychiatric:        Mood and Affect: Mood normal.        Behavior: Behavior normal.     (all labs ordered are listed, but only abnormal results are displayed) Labs Reviewed  BASIC METABOLIC PANEL WITH GFR - Abnormal; Notable for the following components:      Result Value   Sodium 132 (*)    Potassium 3.1 (*)    Chloride 96 (*)    Glucose, Bld 126 (*)    All other components within normal limits  CBC WITH DIFFERENTIAL/PLATELET - Abnormal; Notable for the following components:   WBC 11.7 (*)    Neutro Abs 7.9 (*)    All other components within normal limits  URINALYSIS, ROUTINE W REFLEX MICROSCOPIC - Abnormal; Notable for the following components:   Leukocytes,Ua MODERATE (*)    All other components within normal limits  LITHIUM  LEVEL  TSH    EKG: None  Radiology: CT Lumbar Spine Wo Contrast Result Date: 06/30/2024 CLINICAL DATA:  Low back pain, increased fracture risk. EXAM: CT LUMBAR SPINE WITHOUT CONTRAST TECHNIQUE: Multidetector CT imaging of the lumbar spine was performed without intravenous contrast administration. Multiplanar CT image reconstructions were also generated. RADIATION DOSE REDUCTION: This  exam was performed according to the departmental dose-optimization program which includes automated exposure control, adjustment of the mA and/or kV according to patient size and/or use of iterative reconstruction technique. COMPARISON:  CT abdomen and pelvis 08/31/2022 FINDINGS: Segmentation: 5 lumbar type vertebrae. Alignment: Mild upper lumbar dextroscoliosis. No significant listhesis. Vertebrae: Recent, nondisplaced left L3 transverse process fracture. Preserved vertebral body heights. No destructive process. Paraspinal and other soft tissues: Mild abdominal aortic atherosclerosis without aneurysm. Mild edema in the soft tissues surrounding the transverse  process fracture. Disc levels: Mild lumbar spondylosis. Mild-to-moderate right and mild left neural foraminal stenosis at L4-5 due to disc bulging and mild facet hypertrophy. No evidence of significant spinal canal stenosis. IMPRESSION: 1. Nondisplaced left L3 transverse process fracture. 2.  Aortic Atherosclerosis (ICD10-I70.0). Electronically Signed   By: Dasie Hamburg M.D.   On: 06/30/2024 12:59     Procedures   Medications Ordered in the ED  potassium chloride  SA (KLOR-CON  M) CR tablet 40 mEq (has no administration in time range)  morphine  (PF) 4 MG/ML injection 4 mg (4 mg Intravenous Given 06/30/24 1156)  ondansetron  (ZOFRAN ) injection 4 mg (4 mg Intravenous Given 06/30/24 1156)  ketorolac  (TORADOL ) 30 MG/ML injection 30 mg (30 mg Intravenous Given 06/30/24 1156)  dexamethasone  (DECADRON ) injection 10 mg (10 mg Intravenous Given 06/30/24 1157)                                    Medical Decision Making Amount and/or Complexity of Data Reviewed Labs: ordered. Radiology: ordered.  Risk Prescription drug management.   This patient presents to the ED for concern of lbp, this involves an extensive number of treatment options, and is a complaint that carries with it a high risk of complications and morbidity.  The differential diagnosis includes  muscular strain, sciatica, compression fx   Co morbidities that complicate the patient evaluation  bipolar d/o, hld, sleep apnea, osteoporosis, and hypothyroidism   Additional history obtained:  Additional history obtained from epic chart review External records from outside source obtained and reviewed including husband   Lab Tests:  I Ordered, and personally interpreted labs.  The pertinent results include:  cbc with wbc sl elevated at 11.7; bmp nl other than k low at 3.1; lithium  and tsh nl; ua neg for uti   Imaging Studies ordered:  I ordered imaging studies including ct lumbar  I independently visualized and interpreted imaging which showed  . Nondisplaced left L3 transverse process fracture.  2.  Aortic Atherosclerosis (ICD10-I70.0).   I agree with the radiologist interpretation     Medicines ordered and prescription drug management:  I ordered medication including morphine , decadron , toradol , and zofran   for sx  Reevaluation of the patient after these medicines showed that the patient improved I have reviewed the patients home medicines and have made adjustments as needed   Test Considered:  ct   Critical Interventions:  Pain control   Problem List / ED Course:  L3 lumbar process fracture:  pain is much improved.  She is able to ambulate.  Brace ordered.   Hypokalemia:  kdur ordered.  Likely from hydrochlorothiazide.   Reevaluation:  After the interventions noted above, I reevaluated the patient and found that they have :improved   Social Determinants of Health:  Lives at home   Dispostion:  After consideration of the diagnostic results and the patients response to treatment, I feel that the patent would benefit from discharge with outpatient f/u.       Final diagnoses:  Closed fracture of transverse process of lumbar vertebra, initial encounter (HCC)  Sciatica of left side  Hypokalemia    ED Discharge Orders          Ordered     HYDROcodone -acetaminophen  (NORCO /VICODIN) 5-325 MG tablet  Every 4 hours PRN        06/30/24 1308    predniSONE  (DELTASONE ) 50 MG tablet  Daily with breakfast  06/30/24 1308    diazepam  (VALIUM ) 5 MG tablet  2 times daily PRN        06/30/24 1308               Dean Clarity, MD 06/30/24 1343

## 2024-06-30 NOTE — Progress Notes (Signed)
 Orthopedic Tech Progress Note Patient Details:  [name] 12-Jul-1961 [id number]  Ortho Devices Type of Ortho Device: Lumbar corsett Ortho Device/Splint Interventions: Ordered, Application, Adjustment   Post Interventions Patient Tolerated: Fair Instructions Provided: Care of device  [name] 06/30/2024, 1:32 PM

## 2024-07-03 DIAGNOSIS — M545 Low back pain, unspecified: Secondary | ICD-10-CM | POA: Insufficient documentation

## 2024-07-03 DIAGNOSIS — M5459 Other low back pain: Secondary | ICD-10-CM | POA: Diagnosis not present

## 2024-07-03 DIAGNOSIS — M5416 Radiculopathy, lumbar region: Secondary | ICD-10-CM | POA: Diagnosis not present

## 2024-07-08 DIAGNOSIS — M5416 Radiculopathy, lumbar region: Secondary | ICD-10-CM | POA: Insufficient documentation

## 2024-07-10 DIAGNOSIS — M5416 Radiculopathy, lumbar region: Secondary | ICD-10-CM | POA: Diagnosis not present

## 2024-07-10 DIAGNOSIS — M5440 Lumbago with sciatica, unspecified side: Secondary | ICD-10-CM | POA: Diagnosis not present

## 2024-07-11 DIAGNOSIS — M545 Low back pain, unspecified: Secondary | ICD-10-CM | POA: Diagnosis not present

## 2024-07-16 DIAGNOSIS — M5416 Radiculopathy, lumbar region: Secondary | ICD-10-CM | POA: Diagnosis not present

## 2024-07-16 DIAGNOSIS — M5440 Lumbago with sciatica, unspecified side: Secondary | ICD-10-CM | POA: Diagnosis not present

## 2024-07-17 ENCOUNTER — Telehealth: Admitting: Psychiatry

## 2024-07-17 ENCOUNTER — Encounter: Payer: Self-pay | Admitting: Psychiatry

## 2024-07-17 DIAGNOSIS — Z79899 Other long term (current) drug therapy: Secondary | ICD-10-CM

## 2024-07-17 DIAGNOSIS — F3132 Bipolar disorder, current episode depressed, moderate: Secondary | ICD-10-CM

## 2024-07-17 DIAGNOSIS — R251 Tremor, unspecified: Secondary | ICD-10-CM | POA: Diagnosis not present

## 2024-07-17 DIAGNOSIS — F411 Generalized anxiety disorder: Secondary | ICD-10-CM

## 2024-07-17 MED ORDER — BUPROPION HCL 75 MG PO TABS
75.0000 mg | ORAL_TABLET | Freq: Every morning | ORAL | 1 refills | Status: AC
Start: 2024-07-17 — End: ?

## 2024-07-17 MED ORDER — AMANTADINE HCL 100 MG PO CAPS: 100.0000 mg | ORAL_CAPSULE | Freq: Two times a day (BID) | ORAL | 4 refills | Status: DC

## 2024-07-17 MED ORDER — LITHIUM CARBONATE ER 300 MG PO TBCR: 300.0000 mg | EXTENDED_RELEASE_TABLET | Freq: Every day | ORAL | 4 refills | Status: AC

## 2024-07-17 NOTE — Progress Notes (Signed)
 Virtual Visit via Video Note  I connected with [name] on 07/17/24 at 11:20 AM EDT by a video enabled telemedicine application and verified that I am speaking with the correct person using two identifiers.  Location Provider Location : [facility] Patient Location : Home  Participants: Patient , Provider    I discussed the limitations of evaluation and management by telemedicine and the availability of in person appointments. The patient expressed understanding and agreed to proceed.   I discussed the assessment and treatment plan with the patient. The patient was provided an opportunity to ask questions and all were answered. The patient agreed with the plan and demonstrated an understanding of the instructions.   The patient was advised to call back or seek an in-person evaluation if the symptoms worsen or if the condition fails to improve as anticipated.  BH MD OP Progress Note  07/17/2024 2:21 PM SANII KUKLA  MRN:  [id number]  Chief Complaint:  Chief Complaint  Patient presents with   Medication Refill   Follow-up   Depression   Anxiety   Insomnia   Discussed the use of AI scribe software for clinical note transcription with the patient, who gave verbal consent to proceed.  History of Present Illness [name] is a 63 year old Caucasian female, on disability, married, currently lives in [place], has a history of bipolar disorder, GAD, primary insomnia, OSA on CPAP, hyperlipidemia, osteoporosis, history of cataract surgery was evaluated by telemedicine today.  Significant stress related to recent medical issues, including a vertebral fracture, ongoing pain, and physical limitations requiring wheelchair use and reliance on her husband for assistance with daily activities, contributes to her current experience. She reports feeling tired and acknowledges that the situation has been very stressful, particularly as she awaits further diagnostic results  and continues to manage her recovery at home. She states she is trying to make use of her support system and continues to see her therapist, with a video session scheduled for the following day.  She denies any thoughts about hurting herself or others. She continues to have difficulty sleeping, as she typically gets only 3 to 4 hours of sleep per night due to pain and discomfort, even though she uses a therapeutic bed. She describes challenges with comfort and rest, and notes that hydrocodone , which her provider prescribed for pain, causes her to vomit, so she takes it sparingly and in small doses.  Her current medication regimen includes lithium , amantadine  (twice daily), Wellbutrin  75 mg once daily in the morning, and hydroxyzine  as needed.  She describes efforts to maintain hydration due to concerns about lithium  toxicity, especially because she also takes hydrochlorothiazide.    Visit Diagnosis:    ICD-10-CM   1. Bipolar 1 disorder, depressed, moderate (HCC)  F31.32 lithium  carbonate (LITHOBID ) 300 MG ER tablet    2. GAD (generalized anxiety disorder)  F41.1 buPROPion  (WELLBUTRIN ) 75 MG tablet    3. Tremor  R25.1 amantadine  (SYMMETREL ) 100 MG capsule   Parkinsonian secondary to medications as well as functional    4. High risk medication use  Z79.899 Lithium  level    BUN    Creatinine, serum      Past Psychiatric History: I have reviewed past psychiatric history from progress note on 09/01/2021 as well as 03/21/2024.  Past Medical History:  Past Medical History:  Diagnosis Date   Bipolar 1 disorder (HCC)    Hyperlipidemia    OSA (obstructive sleep apnea)    CPAP dependent   Osteoporosis  PPD positive     Past Surgical History:  Procedure Laterality Date   ABDOMINAL HYSTERECTOMY     CATARACT EXTRACTION, BILATERAL     due to seroquel    ROBOTIC ASSISTED LAPAROSCOPIC HYSTERECTOMY AND SALPINGECTOMY      Family Psychiatric History: I have reviewed family psychiatric history  from progress note on 09/01/2021.  Family History:  Family History  Problem Relation Age of Onset   Personality disorder Mother    Tremor Mother    Alcohol abuse Maternal Uncle    Tremor Maternal Grandfather    Bipolar disorder Cousin    Schizophrenia Other    Breast cancer Neg Hx     Social History: I have reviewed social history from progress note on 09/01/2021. Social History   Socioeconomic History   Marital status: Married    Spouse name: eric   Number of children: 2   Years of education: Not on file   Highest education level: Bachelor's degree (e.g., BA, AB, BS)  Occupational History    Comment: unemployed  Tobacco Use   Smoking status: Never   Smokeless tobacco: Never  Vaping Use   Vaping status: Never Used  Substance and Sexual Activity   Alcohol use: No   Drug use: No   Sexual activity: Yes  Other Topics Concern   Not on file  Social History Narrative   Right handed   Dosnt work    Social Drivers of Corporate investment banker Strain: Not on file  Food Insecurity: Not on file  Transportation Needs: Not on file  Physical Activity: Not on file  Stress: Not on file  Social Connections: Unknown (03/07/2022)   Received from [employer]   Social Network    Social Network: Not on file    Allergies:  Allergies  Allergen Reactions   Tyloxapol Rash   Prilosec [Omeprazole]     Interacts with psych meds   Propranolol  Nausea And Vomiting   Tylox [Oxycodone-Acetaminophen ] Rash    Metabolic Disorder Labs: No results found for: HGBA1C, MPG Lab Results  Component Value Date   PROLACTIN 5.1 09/01/2021   No results found for: CHOL, TRIG, HDL, CHOLHDL, VLDL, LDLCALC Lab Results  Component Value Date   TSH 2.521 06/30/2024   TSH 3.652 12/06/2023    Therapeutic Level Labs: Lab Results  Component Value Date   LITHIUM  1.05 06/30/2024   LITHIUM  0.66 03/13/2024   Lab Results  Component Value Date   VALPROATE 89 05/24/2023   VALPROATE  69 01/17/2023   No results found for: CBMZ  Current Medications: Current Outpatient Medications  Medication Sig Dispense Refill   alendronate (FOSAMAX) 70 MG tablet TAKE 1 TABLET BY MOUTH ONCE WEEKLY 30 MINUTES BEFORE FIRST FOOD, MEDICINE, OR WATER OF THE DAY     amantadine  (SYMMETREL ) 100 MG capsule Take 1 capsule (100 mg total) by mouth 2 (two) times daily. 60 capsule 4   buPROPion  (WELLBUTRIN ) 75 MG tablet Take 1 tablet (75 mg total) by mouth in the morning. 90 tablet 1   diazepam  (VALIUM ) 5 MG tablet Take 1 tablet (5 mg total) by mouth 2 (two) times daily as needed for anxiety. 10 tablet 0   hydrochlorothiazide (HYDRODIURIL) 25 MG tablet 1 tablet in the morning Orally Once a day for edema/BP; Duration: 30 days     HYDROcodone -acetaminophen  (NORCO /VICODIN) 5-325 MG tablet Take 1 tablet by mouth every 4 (four) hours as needed. 10 tablet 0   hydrOXYzine  (ATARAX ) 25 MG tablet Take 1-2 tablets (25-50 mg  total) by mouth at bedtime as needed. For sleep, anxiety 180 tablet 3   levothyroxine (SYNTHROID) 25 MCG tablet Take 25 mcg by mouth daily before breakfast.     lithium  carbonate (LITHOBID ) 300 MG ER tablet Take 1 tablet (300 mg total) by mouth daily with lunch. DOSE REDUCTION 30 tablet 4   methocarbamol  (ROBAXIN ) 500 MG tablet Take 1 tablet (500 mg total) by mouth 2 (two) times daily as needed for muscle spasms. 10 tablet 0   Multiple Vitamin (MULTIVITAMIN) tablet Take 1 tablet by mouth daily.     ondansetron  (ZOFRAN ) 4 MG tablet Take 1 tablet (4 mg total) by mouth every 8 (eight) hours as needed for nausea or vomiting. 10 tablet 0   simvastatin (ZOCOR) 40 MG tablet Take 40 mg by mouth daily.     valACYclovir (VALTREX) 500 MG tablet 1 tablet Orally Twice a day as needed for outbreak     No current facility-administered medications for this visit.     Musculoskeletal: Strength & Muscle Tone: UTA Gait & Station: Seated Patient leans: N/A  Psychiatric Specialty Exam: Review of Systems   Psychiatric/Behavioral:  Positive for dysphoric mood and sleep disturbance. The patient is nervous/anxious.     There were no vitals taken for this visit.There is no height or weight on file to calculate BMI.  General Appearance: Casual  Eye Contact:  Fair  Speech:  Clear and Coherent  Volume:  Normal  Mood:  Anxious and Depressed due to situational stressors, pain, recent medical problems  Affect:  Congruent  Thought Process:  Goal Directed and Descriptions of Associations: Intact  Orientation:  Full (Time, Place, and Person)  Thought Content: Logical   Suicidal Thoughts:  No  Homicidal Thoughts:  No  Memory:  Immediate;   Fair Recent;   Fair Remote;   Fair  Judgement:  Fair  Insight:  Fair  Psychomotor Activity:  Normal  Concentration:  Concentration: Fair and Attention Span: Fair  Recall:  [employer] of Knowledge: Fair  Language: Fair  Akathisia:  No  Handed:  Right  AIMS (if indicated): not done  Assets:  Communication Skills Desire for Improvement Housing Social Support  ADL's:  Intact  Cognition: WNL  Sleep:  Poor due to pain   Screenings: AIMS    Flowsheet Row Office Visit from 05/15/2024 in [facility] Office Visit from 05/15/2023 in [facility] Office Visit from 02/01/2023 in [facility] Office Visit from 01/10/2023 in [facility] Office Visit from 12/22/2022 in [facility]  AIMS Total Score 0 0 0 0 0   GAD-7    Flowsheet Row Office Visit from 05/15/2024 in [facility] Office Visit from 01/15/2024 in [facility] Office Visit from 05/15/2023 in [facility] Office Visit from 03/13/2023 in [facility] Office  Visit from 02/01/2023 in [facility]  Total GAD-7 Score 15 11 13 16 7    PHQ2-9    Flowsheet Row Office Visit from 05/15/2024 in [facility] Office Visit from 01/15/2024 in [facility] Video Visit from 01/04/2024 in [facility] Video Visit from 07/25/2023 in [facility] Office Visit from 05/15/2023 in [facility]  PHQ-2 Total Score  2 3 4 2 6   PHQ-9 Total Score 8 14 16 11 17    Flowsheet Row Video Visit from 07/17/2024 in [facility] ED from 06/30/2024 in [facility] at [facility] UC from 06/29/2024 in [facility]   C-SSRS RISK CATEGORY No Risk No Risk No Risk     Assessment and Plan: [name] is a 63 year old Caucasian female, presented for a follow-up appointment.  Discussed assessment and plan as noted below.  1. Bipolar 1 disorder, depressed, moderate (HCC)-unstable Currently mood symptoms worsened due to current physical problems including recent fracture, pain and physical limitations.  Currently undergoing further evaluation by orthopedic provider and is undergoing treatment for fracture.  Will not make any further medication changes at this time.  Lithium  level recently was borderline high at 1.05.  Likely due to interaction with HCTZ other factors.  Patient agreeable to dosage reduction of lithium  at this time. Reduce Lithium  to 300 mg daily Will repeat Lithium  level in a week to 2 weeks. Continue Wellbutrin  75 mg daily  2. GAD (generalized anxiety disorder)-unstable Currently with multiple situational stressors, which is making anxiety worse.  Engaged in psychotherapy. Continue psychotherapy sessions with Dr. Nat. Continue Hydroxyzine  25 mg up  to 2 tablets at night as needed.  3. Tremor-improving Parkinsonian as well as secondary to medications and functional.  Neurology evaluation completed.  Currently tolerating Amantadine  with good benefit. Continue Amantadine  100 mg twice daily Continue follow-up with neurology as needed.  4. High risk medication use Will order labs including lithium  level, BUN, creatinine.  Patient to go to [facility] lab.  Patient will also benefit from sodium, potassium levels since recent levels were low.  Patient to discuss this with primary care provider.  Patient is also on HCTZ likely contributing to abnormal labs as well.  Patient to be cautious while combining HCTZ with lithium .  Lithium  dosage has been reduced as noted above.  Follow-up Follow-up in clinic in 4 to 6 weeks or sooner if needed.  Collaboration of Care: Collaboration of Care: Reviewed emergency department visit dated 06/30/2024-Dr. Haviland-patient with closed fracture of transverse process of lumbar vertebrae.  Patient/Guardian was advised Release of Information must be obtained prior to any record release in order to collaborate their care with an outside provider. Patient/Guardian was advised if they have not already done so to contact the registration department to sign all necessary forms in order for us  to release information regarding their care.   Consent: Patient/Guardian gives verbal consent for treatment and assignment of benefits for services provided during this visit. Patient/Guardian expressed understanding and agreed to proceed.   This note was generated in part or whole with voice recognition software. Voice recognition is usually quite accurate but there are transcription errors that can and very often do occur. I apologize for any typographical errors that were not detected and corrected.    Ammiel Guiney, MD 07/17/2024, 2:21 PM

## 2024-07-18 DIAGNOSIS — F3181 Bipolar II disorder: Secondary | ICD-10-CM | POA: Diagnosis not present

## 2024-07-18 NOTE — Progress Notes (Addendum)
 [facility] [place] Follow Up Location Information: Patient State (at time of visit): [place]  Patient Location (at time of visit):Home/Other Non-Medical  Provider Location: Non-Provider-Based Clinic (Clinic, non-hospital) Is provider licensed to provide clinical care in the current location/state of the patient? Yes   Consent:  Patient's identity was confirmed. Presenting condition or illness was discussed with the patient/personal representative. Current proposed treatment for presenting condition or illness was explained to patient/personal representative along with the likely benefits and any significant risks or complications associated with the provision of treatment by audio/video means. The patient/personal representative verbally authorized treatment to be provided by audio/video, which may include a limited review of patient's current health status, medication, or other treatment recommendations, patient education, and an opportunity to ask questions about condition and treatment. Verbal Consent Granted by Patient/Personal Representative:Yes   Visit Information: Modality: 2-Way Real-Time Audio/Video  Video Start Time: 2:13 Video Stop Time: 2:43 Video Total Time: 30 min  Therapy  Subjective: Earlier this month Tammy fractured her spine and was in such pain she went to the ED and subsequently has been taking pain medication, using a wheelchair at home, and needs follow-up MRIs. Pain persists but she is managing, she said. Mood has been OK, stable. Her psychiatrist lowered her lithium  to 1 pill a day. She denied worries, mood swings, or any issues with her husband.   Mental Status Exam Appearance: Normal/appropriate Gen: Alert Mood: OK despite pain Affect: Appropriate Sleep: no changes reported Appetite: low Speech: Clear Thought Content: Appropriate Hallucinations: None Intelligence: Average Insight: Aware of  problems Judgment: Managing life situations Suicidal Ideation: Denies suicidal ideation Homicidal Ideation: Denies homicidal ideation   Treatment: Cognitive Behavior Therapy/ Cognitive Re-structuring, Reassurance and Supportive Counseling   Treatment Goals:  Improve mood/mood stability  Manage anxiety   Diagnosis Bipolar 2, stable GAD, in partial remission OCD, in remission    Plan: Individual therapy in 2 weeks Continue med management with [facility] psychiatrist, Maryellen Barth, MD, in [place]

## 2024-07-23 DIAGNOSIS — M5416 Radiculopathy, lumbar region: Secondary | ICD-10-CM | POA: Diagnosis not present

## 2024-07-23 DIAGNOSIS — M546 Pain in thoracic spine: Secondary | ICD-10-CM | POA: Diagnosis not present

## 2024-07-24 DIAGNOSIS — E871 Hypo-osmolality and hyponatremia: Secondary | ICD-10-CM | POA: Diagnosis not present

## 2024-07-24 DIAGNOSIS — R0602 Shortness of breath: Secondary | ICD-10-CM | POA: Diagnosis not present

## 2024-07-24 DIAGNOSIS — D72829 Elevated white blood cell count, unspecified: Secondary | ICD-10-CM | POA: Diagnosis not present

## 2024-07-24 DIAGNOSIS — Z79899 Other long term (current) drug therapy: Secondary | ICD-10-CM

## 2024-07-24 DIAGNOSIS — E876 Hypokalemia: Secondary | ICD-10-CM | POA: Diagnosis not present

## 2024-07-29 ENCOUNTER — Other Ambulatory Visit
Admission: RE | Admit: 2024-07-29 | Discharge: 2024-07-29 | Disposition: A | Discharge status: Home / Self Care | Attending: Psychiatry | Admitting: Psychiatry

## 2024-07-29 ENCOUNTER — Ambulatory Visit: Payer: Self-pay | Admitting: Psychiatry

## 2024-07-29 DIAGNOSIS — Z79899 Other long term (current) drug therapy: Secondary | ICD-10-CM | POA: Diagnosis not present

## 2024-07-29 LAB — LITHIUM LEVEL: Lithium Lvl: 0.45 mmol/L — ABNORMAL LOW (ref 0.60–1.20)

## 2024-07-31 ENCOUNTER — Other Ambulatory Visit (HOSPITAL_COMMUNITY): Payer: Self-pay | Admitting: Internal Medicine

## 2024-07-31 DIAGNOSIS — R0602 Shortness of breath: Secondary | ICD-10-CM

## 2024-08-14 NOTE — H&P (View-Only) (Signed)
 Cardiology Office Note:    Date:  08/28/2024   ID:  [name], DOB 1961-03-22, MRN [id number]  PCP:  Kristin Velna SAUNDERS, MD   [facility] Providers Cardiologist:  None     Referring MD: Kristin Velna SAUNDERS, MD   Chief Complaint  Patient presents with   Shortness of Breath   Leg Swelling    History of Present Illness:    [name] is a 63 y.o. female seen at the request of Dr Kristin for evaluation of SOB and edema. She has a history of obesity with OSA on CPAP, HLD and bipolar disorder. She is a retired Insurance Underwriter.  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 She had never had this before. Was placed on hydrochlorothiazide but this did not help and resulted in hyponatremia and hypokalemia. Reports BNP done and was normal. CXR was normal. Denies any injury or extended travel. States her chest feels sore. She did have an L3 compression fracture in September and had a lot of N/V with this. She feels like in October her SOB and edema are worse. Also notes significant fatigue. Husband states just taking a shower now she gets out of breath and has to sit down.   Past Medical History:  Diagnosis Date   Atherosclerosis of aorta    Bipolar 1 disorder (HCC)    Depression    Esophageal stenosis    Esophagitis    GAD (generalized anxiety disorder)    GERD (gastroesophageal reflux disease)    Herpes simplex    Hyperlipidemia    Hypomania (HCC)    Insomnia    OSA (obstructive sleep apnea)    CPAP dependent   Osteoporosis    PPD positive    Pre-diabetes    Tremor     Past Surgical History:  Procedure Laterality Date   ABDOMINAL HYSTERECTOMY     CATARACT EXTRACTION, BILATERAL     due to seroquel    ROBOTIC ASSISTED LAPAROSCOPIC HYSTERECTOMY AND SALPINGECTOMY      Current Medications: Current Meds  Medication Sig   alendronate (FOSAMAX) 70 MG tablet TAKE 1 TABLET BY MOUTH ONCE WEEKLY 30 MINUTES BEFORE FIRST FOOD,  MEDICINE, OR WATER OF THE DAY   amantadine  (SYMMETREL ) 100 MG capsule Take 1 capsule (100 mg total) by mouth 2 (two) times daily.   buPROPion  (WELLBUTRIN ) 75 MG tablet Take 1 tablet (75 mg total) by mouth in the morning.   hydrOXYzine  (ATARAX ) 25 MG tablet Take 1-2 tablets (25-50 mg total) by mouth at bedtime as needed. For sleep, anxiety   levothyroxine (SYNTHROID) 25 MCG tablet Take 25 mcg by mouth daily before breakfast.   lithium  carbonate (LITHOBID ) 300 MG ER tablet Take 1 tablet (300 mg total) by mouth daily with lunch. DOSE REDUCTION   Multiple Vitamin (MULTIVITAMIN) tablet Take 1 tablet by mouth daily.   simvastatin (ZOCOR) 40 MG tablet Take 40 mg by mouth daily.   valACYclovir (VALTREX) 500 MG tablet 1 tablet Orally Twice a day as needed for outbreak     Allergies:   Tyloxapol, Prilosec [omeprazole], Propranolol , and Tylox [oxycodone-acetaminophen ]   Social History   Socioeconomic History   Marital status: Married    Spouse name: eric   Number of children: 2   Years of education: Not on file   Highest education level: Bachelor's degree (e.g., BA, AB, BS)  Occupational History    Comment: unemployed  Tobacco Use   Smoking status: Never  Smokeless tobacco: Never  Vaping Use   Vaping status: Never Used  Substance and Sexual Activity   Alcohol use: No   Drug use: No   Sexual activity: Yes  Other Topics Concern   Not on file  Social History Narrative   Right handed   Dosnt work    Social Drivers of Corporate Investment Banker Strain: Not on file  Food Insecurity: Not on file  Transportation Needs: Not on file  Physical Activity: Not on file  Stress: Not on file  Social Connections: Unknown (03/07/2022)   Received from [employer]   Social Network    Social Network: Not on file     Family History: The patient's family history includes AAA (abdominal aortic aneurysm) in her father; Alcohol abuse in her maternal uncle; Bipolar disorder in her cousin;  Hypertension in her father; Personality disorder in her mother; Schizophrenia in an other family member; Stroke in her father; Tremor in her maternal grandfather and mother. There is no history of Breast cancer.  ROS:   Please see the history of present illness.    All other systems reviewed and are negative.  EKGs/Labs/Other Studies Reviewed:    The following studies were reviewed today: EKG Interpretation Date/Time:  Wednesday August 28 2024 11:29:23 EST Ventricular Rate:  78 PR Interval:  154 QRS Duration:  78 QT Interval:  366 QTC Calculation: 417 R Axis:   -5  Text Interpretation: Normal sinus rhythm Normal ECG When compared with ECG of 05-Feb-2024 16:00, Vent. rate has increased BY  28 BPM Confirmed by Antwoin Lackey [phone]) on 08/28/2024 11:35:47 AM   EKG Interpretation Date/Time:  Wednesday August 28 2024 11:29:23 EST Ventricular Rate:  78 PR Interval:  154 QRS Duration:  78 QT Interval:  366 QTC Calculation: 417 R Axis:   -5  Text Interpretation: Normal sinus rhythm Normal ECG When compared with ECG of 05-Feb-2024 16:00, Vent. rate has increased BY  28 BPM Confirmed by Kambra Beachem [phone]) on 08/28/2024 11:35:47 AM  Echo 08/15/24: IMPRESSIONS     1. Left ventricular ejection fraction, by estimation, is 50 to 55%. Left  ventricular ejection fraction by 3D volume is 50 %. The left ventricle has  low normal function. The left ventricle demonstrates regional wall motion  abnormalities (see scoring  diagram/findings for description). Left ventricular diastolic parameters  were normal. The average left ventricular global longitudinal strain is  -17.3 %. The global longitudinal strain is abnormal.   2. Right ventricular systolic function is normal. The right ventricular  size is mildly enlarged. Tricuspid regurgitation signal is inadequate for  assessing PA pressure.   3. The mitral valve is normal in structure. Mild mitral valve  regurgitation. No evidence of mitral  stenosis.   4. The aortic valve is tricuspid. Aortic valve regurgitation is not  visualized. No aortic stenosis is present.   5. The inferior vena cava is normal in size with greater than 50%  respiratory variability, suggesting right atrial pressure of 3 mmHg.   Comparison(s): No prior Echocardiogram.    Recent Labs: 02/05/2024: ALT 26 06/30/2024: BUN 15; Creatinine, Ser 0.90; Hemoglobin 14.1; Platelets 360; Potassium 3.1; Sodium 132; TSH 2.521  Recent Lipid Panel No results found for: CHOL, TRIG, HDL, CHOLHDL, VLDL, LDLCALC, LDLDIRECT   Risk Assessment/Calculations:           Physical Exam:    VS:  BP (!) 150/90   Pulse 78   Ht 5' 5 (1.651 m)   Wt 142 lb (  64.4 kg)   SpO2 98%   BMI 23.63 kg/m     Wt Readings from Last 3 Encounters:  08/28/24 142 lb (64.4 kg)  01/22/24 160 lb 9.6 oz (72.8 kg)  08/31/22 149 lb (67.6 kg)     GEN:  Well nourished, well developed in no acute distress2 HEENT: Normal NECK: No JVD; No carotid bruits LYMPHATICS: No lymphadenopathy CARDIAC: RRR, no murmurs, rubs, gallops RESPIRATORY:  Clear to auscultation without rales, wheezing or rhonchi  ABDOMEN: Soft, non-tender, non-distended MUSCULOSKELETAL:  1+ pitting edema; No deformity  SKIN: Warm and dry NEUROLOGIC:  Alert and oriented x 3 PSYCHIATRIC:  Normal affect   ASSESSMENT:    1. Exertional shortness of breath   2. Bilateral edema of lower extremity   3. Edema, unspecified type   4. Hypercholesteremia    PLAN:    In order of problems listed above:  Progressive DOE and LE edema. Labs including blood counts, thyroid , LFTs, renal function and UA ok. Apparently BNP normal. CXR clear. Echo shows mildly reduced EF with regional wall motion abnormality raising concern for ischemic heart disease. Other consideration would be pulmonary embolus. I recommend CT with contrast to rule out PE first. If negative then I feel the next step would be a right and left heart cath. Will  repeat BMET, CBC and lipids today.  HLD. On simvastatin. LDL 128.  If  she does have CAD will need more aggressive lipid lowering therapy.  OSA on CPAP Bipolar disorder.            Medication Adjustments/Labs and Tests Ordered: Current medicines are reviewed at length with the patient today.  Concerns regarding medicines are outlined above.  Orders Placed This Encounter  Procedures   CT Angio Chest Pulmonary Embolism (PE) W or WO Contrast   Basic metabolic panel with GFR   CBC w/Diff   Lipid panel   EKG 12-Lead   No orders of the defined types were placed in this encounter.   Patient Instructions  Medication Instructions:  Continue same medications  Lab Work: Bmet,cbc,lipid panel   Testing/Procedures: Chest CT    schedule soon  Follow-Up: At [facility], you and your health needs are our priority.  As part of our continuing mission to provide you with exceptional heart care, our providers are all part of one team.  This team includes your primary Cardiologist (physician) and Advanced Practice Providers or APPs (Physician Assistants and Nurse Practitioners) who all work together to provide you with the care you need, when you need it.  Your next appointment:  To Be Determined    Provider:  Dr.Celedonio Sortino   We recommend signing up for the patient portal called MyChart.  Sign up information is provided on this After Visit Summary.  MyChart is used to connect with patients for Virtual Visits (Telemedicine).  Patients are able to view lab/test results, encounter notes, upcoming appointments, etc.  Non-urgent messages can be sent to your provider as well.   To learn more about what you can do with MyChart, go to [email or web address].      Signed, Zeek Rostron, MD  08/28/2024 12:07 PM    [facility]

## 2024-08-14 NOTE — Progress Notes (Signed)
 Cardiology Office Note:    Date:  08/28/2024   ID:  [name], DOB 1961-03-22, MRN [id number]  PCP:  [name], MD   [facility] Providers Cardiologist:  None     Referring MD: [name], MD   Chief Complaint  Patient presents with   Shortness of Breath   Leg Swelling    History of Present Illness:    [name] is a 63 y.o. female seen at the request of Dr Vernon for evaluation of SOB and edema. She has a history of obesity with OSA on CPAP, HLD and bipolar disorder. She is a retired Insurance Underwriter.  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 She had never had this before. Was placed on hydrochlorothiazide but this did not help and resulted in hyponatremia and hypokalemia. Reports BNP done and was normal. CXR was normal. Denies any injury or extended travel. States her chest feels sore. She did have an L3 compression fracture in September and had a lot of N/V with this. She feels like in October her SOB and edema are worse. Also notes significant fatigue. Husband states just taking a shower now she gets out of breath and has to sit down.   Past Medical History:  Diagnosis Date   Atherosclerosis of aorta    Bipolar 1 disorder (HCC)    Depression    Esophageal stenosis    Esophagitis    GAD (generalized anxiety disorder)    GERD (gastroesophageal reflux disease)    Herpes simplex    Hyperlipidemia    Hypomania (HCC)    Insomnia    OSA (obstructive sleep apnea)    CPAP dependent   Osteoporosis    PPD positive    Pre-diabetes    Tremor     Past Surgical History:  Procedure Laterality Date   ABDOMINAL HYSTERECTOMY     CATARACT EXTRACTION, BILATERAL     due to seroquel    ROBOTIC ASSISTED LAPAROSCOPIC HYSTERECTOMY AND SALPINGECTOMY      Current Medications: Current Meds  Medication Sig   alendronate (FOSAMAX) 70 MG tablet TAKE 1 TABLET BY MOUTH ONCE WEEKLY 30 MINUTES BEFORE FIRST FOOD,  MEDICINE, OR WATER OF THE DAY   amantadine  (SYMMETREL ) 100 MG capsule Take 1 capsule (100 mg total) by mouth 2 (two) times daily.   buPROPion  (WELLBUTRIN ) 75 MG tablet Take 1 tablet (75 mg total) by mouth in the morning.   hydrOXYzine  (ATARAX ) 25 MG tablet Take 1-2 tablets (25-50 mg total) by mouth at bedtime as needed. For sleep, anxiety   levothyroxine (SYNTHROID) 25 MCG tablet Take 25 mcg by mouth daily before breakfast.   lithium  carbonate (LITHOBID ) 300 MG ER tablet Take 1 tablet (300 mg total) by mouth daily with lunch. DOSE REDUCTION   Multiple Vitamin (MULTIVITAMIN) tablet Take 1 tablet by mouth daily.   simvastatin (ZOCOR) 40 MG tablet Take 40 mg by mouth daily.   valACYclovir (VALTREX) 500 MG tablet 1 tablet Orally Twice a day as needed for outbreak     Allergies:   Tyloxapol, Prilosec [omeprazole], Propranolol , and Tylox [oxycodone-acetaminophen ]   Social History   Socioeconomic History   Marital status: Married    Spouse name: eric   Number of children: 2   Years of education: Not on file   Highest education level: Bachelor's degree (e.g., BA, AB, BS)  Occupational History    Comment: unemployed  Tobacco Use   Smoking status: Never  Smokeless tobacco: Never  Vaping Use   Vaping status: Never Used  Substance and Sexual Activity   Alcohol use: No   Drug use: No   Sexual activity: Yes  Other Topics Concern   Not on file  Social History Narrative   Right handed   Dosnt work    Social Drivers of Corporate Investment Banker Strain: Not on file  Food Insecurity: Not on file  Transportation Needs: Not on file  Physical Activity: Not on file  Stress: Not on file  Social Connections: Unknown (03/07/2022)   Received from [employer]   Social Network    Social Network: Not on file     Family History: The patient's family history includes AAA (abdominal aortic aneurysm) in her father; Alcohol abuse in her maternal uncle; Bipolar disorder in her cousin;  Hypertension in her father; Personality disorder in her mother; Schizophrenia in an other family member; Stroke in her father; Tremor in her maternal grandfather and mother. There is no history of Breast cancer.  ROS:   Please see the history of present illness.    All other systems reviewed and are negative.  EKGs/Labs/Other Studies Reviewed:    The following studies were reviewed today: EKG Interpretation Date/Time:  Wednesday August 28 2024 11:29:23 EST Ventricular Rate:  78 PR Interval:  154 QRS Duration:  78 QT Interval:  366 QTC Calculation: 417 R Axis:   -5  Text Interpretation: Normal sinus rhythm Normal ECG When compared with ECG of 05-Feb-2024 16:00, Vent. rate has increased BY  28 BPM Confirmed by Antwoin Lackey [phone]) on 08/28/2024 11:35:47 AM   EKG Interpretation Date/Time:  Wednesday August 28 2024 11:29:23 EST Ventricular Rate:  78 PR Interval:  154 QRS Duration:  78 QT Interval:  366 QTC Calculation: 417 R Axis:   -5  Text Interpretation: Normal sinus rhythm Normal ECG When compared with ECG of 05-Feb-2024 16:00, Vent. rate has increased BY  28 BPM Confirmed by Kambra Beachem [phone]) on 08/28/2024 11:35:47 AM  Echo 08/15/24: IMPRESSIONS     1. Left ventricular ejection fraction, by estimation, is 50 to 55%. Left  ventricular ejection fraction by 3D volume is 50 %. The left ventricle has  low normal function. The left ventricle demonstrates regional wall motion  abnormalities (see scoring  diagram/findings for description). Left ventricular diastolic parameters  were normal. The average left ventricular global longitudinal strain is  -17.3 %. The global longitudinal strain is abnormal.   2. Right ventricular systolic function is normal. The right ventricular  size is mildly enlarged. Tricuspid regurgitation signal is inadequate for  assessing PA pressure.   3. The mitral valve is normal in structure. Mild mitral valve  regurgitation. No evidence of mitral  stenosis.   4. The aortic valve is tricuspid. Aortic valve regurgitation is not  visualized. No aortic stenosis is present.   5. The inferior vena cava is normal in size with greater than 50%  respiratory variability, suggesting right atrial pressure of 3 mmHg.   Comparison(s): No prior Echocardiogram.    Recent Labs: 02/05/2024: ALT 26 06/30/2024: BUN 15; Creatinine, Ser 0.90; Hemoglobin 14.1; Platelets 360; Potassium 3.1; Sodium 132; TSH 2.521  Recent Lipid Panel No results found for: CHOL, TRIG, HDL, CHOLHDL, VLDL, LDLCALC, LDLDIRECT   Risk Assessment/Calculations:           Physical Exam:    VS:  BP (!) 150/90   Pulse 78   Ht 5' 5 (1.651 m)   Wt 142 lb (  64.4 kg)   SpO2 98%   BMI 23.63 kg/m     Wt Readings from Last 3 Encounters:  08/28/24 142 lb (64.4 kg)  01/22/24 160 lb 9.6 oz (72.8 kg)  08/31/22 149 lb (67.6 kg)     GEN:  Well nourished, well developed in no acute distress2 HEENT: Normal NECK: No JVD; No carotid bruits LYMPHATICS: No lymphadenopathy CARDIAC: RRR, no murmurs, rubs, gallops RESPIRATORY:  Clear to auscultation without rales, wheezing or rhonchi  ABDOMEN: Soft, non-tender, non-distended MUSCULOSKELETAL:  1+ pitting edema; No deformity  SKIN: Warm and dry NEUROLOGIC:  Alert and oriented x 3 PSYCHIATRIC:  Normal affect   ASSESSMENT:    1. Exertional shortness of breath   2. Bilateral edema of lower extremity   3. Edema, unspecified type   4. Hypercholesteremia    PLAN:    In order of problems listed above:  Progressive DOE and LE edema. Labs including blood counts, thyroid , LFTs, renal function and UA ok. Apparently BNP normal. CXR clear. Echo shows mildly reduced EF with regional wall motion abnormality raising concern for ischemic heart disease. Other consideration would be pulmonary embolus. I recommend CT with contrast to rule out PE first. If negative then I feel the next step would be a right and left heart cath. Will  repeat BMET, CBC and lipids today.  HLD. On simvastatin. LDL 128.  If  she does have CAD will need more aggressive lipid lowering therapy.  OSA on CPAP Bipolar disorder.            Medication Adjustments/Labs and Tests Ordered: Current medicines are reviewed at length with the patient today.  Concerns regarding medicines are outlined above.  Orders Placed This Encounter  Procedures   CT Angio Chest Pulmonary Embolism (PE) W or WO Contrast   Basic metabolic panel with GFR   CBC w/Diff   Lipid panel   EKG 12-Lead   No orders of the defined types were placed in this encounter.   Patient Instructions  Medication Instructions:  Continue same medications  Lab Work: Bmet,cbc,lipid panel   Testing/Procedures: Chest CT    schedule soon  Follow-Up: At [facility], you and your health needs are our priority.  As part of our continuing mission to provide you with exceptional heart care, our providers are all part of one team.  This team includes your primary Cardiologist (physician) and Advanced Practice Providers or APPs (Physician Assistants and Nurse Practitioners) who all work together to provide you with the care you need, when you need it.  Your next appointment:  To Be Determined    Provider:  Dr.Celedonio Sortino   We recommend signing up for the patient portal called MyChart.  Sign up information is provided on this After Visit Summary.  MyChart is used to connect with patients for Virtual Visits (Telemedicine).  Patients are able to view lab/test results, encounter notes, upcoming appointments, etc.  Non-urgent messages can be sent to your provider as well.   To learn more about what you can do with MyChart, go to [email or web address].      Signed, Zeek Rostron, MD  08/28/2024 12:07 PM    [facility]

## 2024-08-15 ENCOUNTER — Ambulatory Visit (HOSPITAL_COMMUNITY)
Admission: RE | Admit: 2024-08-15 | Discharge: 2024-08-15 | Disposition: A | Discharge status: Home / Self Care | Source: Ambulatory Visit | Attending: Internal Medicine | Admitting: Internal Medicine

## 2024-08-15 DIAGNOSIS — I517 Cardiomegaly: Secondary | ICD-10-CM | POA: Diagnosis not present

## 2024-08-15 DIAGNOSIS — I340 Nonrheumatic mitral (valve) insufficiency: Secondary | ICD-10-CM | POA: Insufficient documentation

## 2024-08-15 DIAGNOSIS — M5416 Radiculopathy, lumbar region: Secondary | ICD-10-CM | POA: Diagnosis not present

## 2024-08-15 DIAGNOSIS — M546 Pain in thoracic spine: Secondary | ICD-10-CM | POA: Diagnosis not present

## 2024-08-15 DIAGNOSIS — R0602 Shortness of breath: Secondary | ICD-10-CM | POA: Insufficient documentation

## 2024-08-15 LAB — ECHOCARDIOGRAM COMPLETE
Area-P 1/2: 3.37 cm2
Calc EF: 61.3 %
S' Lateral: 2.84 cm
Single Plane A2C EF: 65.9 %
Single Plane A4C EF: 54.9 %

## 2024-08-15 NOTE — Progress Notes (Signed)
  Echocardiogram 2D Echocardiogram has been performed.  [place] R 08/15/2024, 11:00 AM

## 2024-08-20 ENCOUNTER — Telehealth: Payer: Self-pay

## 2024-08-20 ENCOUNTER — Encounter: Payer: Self-pay | Admitting: Psychiatry

## 2024-08-20 ENCOUNTER — Telehealth: Admitting: Psychiatry

## 2024-08-20 DIAGNOSIS — R251 Tremor, unspecified: Secondary | ICD-10-CM | POA: Diagnosis not present

## 2024-08-20 DIAGNOSIS — F411 Generalized anxiety disorder: Secondary | ICD-10-CM

## 2024-08-20 DIAGNOSIS — F3176 Bipolar disorder, in full remission, most recent episode depressed: Secondary | ICD-10-CM

## 2024-08-20 NOTE — Progress Notes (Signed)
 Virtual Visit via Video Note  I connected with [name] on 08/20/24 at 10:30 AM EDT by a video enabled telemedicine application and verified that I am speaking with the correct person using two identifiers.  Location Provider Location : [facility] Patient Location : Home  Participants: Patient , Provider   I discussed the limitations of evaluation and management by telemedicine and the availability of in person appointments. The patient expressed understanding and agreed to proceed.   I discussed the assessment and treatment plan with the patient. The patient was provided an opportunity to ask questions and all were answered. The patient agreed with the plan and demonstrated an understanding of the instructions.   The patient was advised to call back or seek an in-person evaluation if the symptoms worsen or if the condition fails to improve as anticipated.   BH MD OP Progress Note  08/20/2024 11:58 AM [name]  MRN:  [id number]  Chief Complaint:  Chief Complaint  Patient presents with   Follow-up   Anxiety   Depression   Medication Refill   Discussed the use of AI scribe software for clinical note transcription with the patient, who gave verbal consent to proceed.  History of Present Illness [name] is a 63 year old Caucasian female, on disability, married, currently lives in [place], has a history of bipolar disorder, GAD, primary insomnia, OSA on CPAP, hyperlipidemia, osteoporosis, history of cataract surgery was evaluated by telemedicine today.  She describes experiencing a combination of anxiety and mild depressive symptoms related to ongoing health concerns, including recent cardiac and physical issues. Difficulty distinguishing whether her mood is more anxious or depressed persists, but she ultimately identifies anxiety as the predominant feeling, particularly in response to her medical situation. To help manage her mood, she uses coping  strategies such as diverting her attention and engaging in activities for others.  She reports that her sleep remains good, with her typically sleeping 7 to 8 hours per night and going to bed early. On some occasions, she reports that increased fatigue leads her to go to bed earlier than usual.  She continues to be compliant on CPAP.  She continues her lithium  regimen and notes that she cut her dose in half at the end of September due to persistent nausea and vomiting at that time. She reports ongoing efforts to maintain adequate hydration while taking lithium . She does not report any current or recent suicidal or homicidal thoughts.  She is currently compliant with psychotherapy sessions and reports it is beneficial.  She has upcoming appointment with cardiology for further workup.   Visit Diagnosis:    ICD-10-CM   1. Bipolar disorder, in full remission, most recent episode depressed  F31.76    Type I    2. GAD (generalized anxiety disorder)  F41.1     3. Tremor  R25.1    Parkinsonian secondary to medications as well as functional      Past Psychiatric History: I have reviewed past psychiatric history from progress note on 09/01/2021 as well as 03/21/2024.  Past Medical History:  Past Medical History:  Diagnosis Date   Atherosclerosis of aorta    Bipolar 1 disorder (HCC)    Depression    Esophageal stenosis    Esophagitis    GAD (generalized anxiety disorder)    GERD (gastroesophageal reflux disease)    Herpes simplex    Hyperlipidemia    Hypomania (HCC)    Insomnia    OSA (obstructive sleep apnea)  CPAP dependent   Osteoporosis    PPD positive    Pre-diabetes    Tremor     Past Surgical History:  Procedure Laterality Date   ABDOMINAL HYSTERECTOMY     CATARACT EXTRACTION, BILATERAL     due to seroquel    ROBOTIC ASSISTED LAPAROSCOPIC HYSTERECTOMY AND SALPINGECTOMY      Family Psychiatric History: I have reviewed family psychiatric history from progress note on  09/01/2021.  Family History:  Family History  Problem Relation Age of Onset   Personality disorder Mother    Tremor Mother    Alcohol abuse Maternal Uncle    Tremor Maternal Grandfather    Bipolar disorder Cousin    Schizophrenia Other    Breast cancer Neg Hx     Social History: I have reviewed social history from progress note on 09/01/2021. Social History   Socioeconomic History   Marital status: Married    Spouse name: eric   Number of children: 2   Years of education: Not on file   Highest education level: Bachelor's degree (e.g., BA, AB, BS)  Occupational History    Comment: unemployed  Tobacco Use   Smoking status: Never   Smokeless tobacco: Never  Vaping Use   Vaping status: Never Used  Substance and Sexual Activity   Alcohol use: No   Drug use: No   Sexual activity: Yes  Other Topics Concern   Not on file  Social History Narrative   Right handed   Dosnt work    Social Drivers of Corporate Investment Banker Strain: Not on file  Food Insecurity: Not on file  Transportation Needs: Not on file  Physical Activity: Not on file  Stress: Not on file  Social Connections: Unknown (03/07/2022)   Received from [employer]   Social Network    Social Network: Not on file    Allergies:  Allergies  Allergen Reactions   Tyloxapol Rash   Prilosec [Omeprazole]     Interacts with psych meds   Propranolol  Nausea And Vomiting   Tylox [Oxycodone-Acetaminophen ] Rash    Metabolic Disorder Labs: No results found for: HGBA1C, MPG Lab Results  Component Value Date   PROLACTIN 5.1 09/01/2021   No results found for: CHOL, TRIG, HDL, CHOLHDL, VLDL, LDLCALC Lab Results  Component Value Date   TSH 2.521 06/30/2024   TSH 3.652 12/06/2023    Therapeutic Level Labs: Lab Results  Component Value Date   LITHIUM  0.45 (L) 07/29/2024   LITHIUM  1.05 06/30/2024   Lab Results  Component Value Date   VALPROATE 89 05/24/2023   VALPROATE 69 01/17/2023    No results found for: CBMZ  Current Medications: Current Outpatient Medications  Medication Sig Dispense Refill   alendronate (FOSAMAX) 70 MG tablet TAKE 1 TABLET BY MOUTH ONCE WEEKLY 30 MINUTES BEFORE FIRST FOOD, MEDICINE, OR WATER OF THE DAY     amantadine  (SYMMETREL ) 100 MG capsule Take 1 capsule (100 mg total) by mouth 2 (two) times daily. 60 capsule 4   buPROPion  (WELLBUTRIN ) 75 MG tablet Take 1 tablet (75 mg total) by mouth in the morning. 90 tablet 1   diazepam  (VALIUM ) 5 MG tablet Take 1 tablet (5 mg total) by mouth 2 (two) times daily as needed for anxiety. 10 tablet 0   hydrochlorothiazide (HYDRODIURIL) 25 MG tablet 1 tablet in the morning Orally Once a day for edema/BP; Duration: 30 days     HYDROcodone -acetaminophen  (NORCO /VICODIN) 5-325 MG tablet Take 1 tablet by mouth every 4 (four) hours  as needed. 10 tablet 0   hydrOXYzine  (ATARAX ) 25 MG tablet Take 1-2 tablets (25-50 mg total) by mouth at bedtime as needed. For sleep, anxiety 180 tablet 3   levothyroxine (SYNTHROID) 25 MCG tablet Take 25 mcg by mouth daily before breakfast.     lithium  carbonate (LITHOBID ) 300 MG ER tablet Take 1 tablet (300 mg total) by mouth daily with lunch. DOSE REDUCTION 30 tablet 4   methocarbamol  (ROBAXIN ) 500 MG tablet Take 1 tablet (500 mg total) by mouth 2 (two) times daily as needed for muscle spasms. 10 tablet 0   Multiple Vitamin (MULTIVITAMIN) tablet Take 1 tablet by mouth daily.     ondansetron  (ZOFRAN ) 4 MG tablet Take 1 tablet (4 mg total) by mouth every 8 (eight) hours as needed for nausea or vomiting. 10 tablet 0   simvastatin (ZOCOR) 40 MG tablet Take 40 mg by mouth daily.     valACYclovir (VALTREX) 500 MG tablet 1 tablet Orally Twice a day as needed for outbreak     No current facility-administered medications for this visit.     Musculoskeletal: Strength & Muscle Tone: UTA Gait & Station: Seated Patient leans: N/A  Psychiatric Specialty Exam: Review of Systems   Psychiatric/Behavioral:  The patient is nervous/anxious.     There were no vitals taken for this visit.There is no height or weight on file to calculate BMI.  General Appearance: Casual  Eye Contact:  Fair  Speech:  Clear and Coherent  Volume:  Normal  Mood:  Anxious  Affect:  Congruent  Thought Process:  Goal Directed and Descriptions of Associations: Intact  Orientation:  Full (Time, Place, and Person)  Thought Content: Logical   Suicidal Thoughts:  No  Homicidal Thoughts:  No  Memory:  Immediate;   Fair Recent;   Fair Remote;   Fair  Judgement:  Fair  Insight:  Fair  Psychomotor Activity:  Normal  Concentration:  Concentration: Fair and Attention Span: Fair  Recall:  [employer] of Knowledge: Fair  Language: Fair  Akathisia:  No  Handed:  Right  AIMS (if indicated): not done  Assets:  Manufacturing Systems Engineer Desire for Improvement Housing Social Support Transportation  ADL's:  Intact  Cognition: WNL  Sleep:  improving   Screenings: Geneticist, Molecular Office Visit from 05/15/2024 in [facility] Office Visit from 05/15/2023 in [facility] Office Visit from 02/01/2023 in [facility] Office Visit from 01/10/2023 in [facility] Office Visit from 12/22/2022 in [facility]  AIMS Total Score 0 0 0 0 0   GAD-7    Flowsheet Row Office Visit from 05/15/2024 in [facility] Office Visit from 01/15/2024 in [facility] Office Visit from 05/15/2023 in [facility] Office Visit from 03/13/2023 in [facility] Office Visit from 02/01/2023 in [facility]  Total GAD-7 Score 15 11 13 16 7     PHQ2-9    Flowsheet Row Office Visit from 05/15/2024 in [facility] Office Visit from 01/15/2024 in [facility] Video Visit from 01/04/2024 in [facility] Video Visit from 07/25/2023 in [facility] Office Visit from 05/15/2023 in [facility]  PHQ-2 Total Score 2 3  4 2 6   PHQ-9 Total Score 8 14 16 11 17    Flowsheet Row Video Visit from 08/20/2024 in [facility] Video Visit from 07/17/2024 in [facility] ED from 06/30/2024 in [facility] Emergency Department at [facility]  C-SSRS RISK CATEGORY No Risk No Risk No Risk     Assessment and Plan: [name] is a 63 year old Caucasian female, presented for a follow-up appointment.  Discussed assessment and plan as noted below.  1. Bipolar disorder, in full remission, most recent episode depressed Currently denies any significant depression symptoms. Continue Lithium  300 mg daily at reduced dosage.(Lithium  level dated 07/29/2024-0.45-subtherapeutic) Continue Wellbutrin  75 mg daily  2. GAD (generalized anxiety disorder)-unstable Although improving continues to have anxiety mostly related to situational/health challenges. Continue psychotherapy sessions with Dr.Nicola Continue Hydroxyzine  25 mg up to 2 tablets at night as needed  3. Tremor-improving Currently reports tremors is better. Continue Amantadine  100 mg twice daily Continue follow-up with neurology as scheduled.  Follow-up Follow-up in clinic in 2 months or sooner if needed.   Consent: Patient/Guardian gives verbal consent for treatment and assignment of benefits for services provided during this visit. Patient/Guardian expressed understanding and agreed to proceed.   This note was  generated in part or whole with voice recognition software. Voice recognition is usually quite accurate but there are transcription errors that can and very often do occur. I apologize for any typographical errors that were not detected and corrected.    Sheranda Seabrooks, MD 08/20/2024, 11:58 AM

## 2024-08-20 NOTE — Telephone Encounter (Signed)
 Medication management - Called pt's [facility], after receiving a call from pt's BC[employer] Medicare, to inform patient's Hydroxyzine  25 mg tablets had been approved and pharmacist verified medications could now be filled as oredered for patient.

## 2024-08-21 DIAGNOSIS — K080 Exfoliation of teeth due to systemic causes: Secondary | ICD-10-CM | POA: Diagnosis not present

## 2024-08-28 ENCOUNTER — Encounter: Payer: Self-pay | Admitting: Cardiology

## 2024-08-28 ENCOUNTER — Ambulatory Visit: Attending: Cardiology | Admitting: Cardiology

## 2024-08-28 VITALS — BP 150/90 | HR 78 | Ht 65.0 in | Wt 142.0 lb

## 2024-08-28 DIAGNOSIS — R609 Edema, unspecified: Secondary | ICD-10-CM | POA: Diagnosis not present

## 2024-08-28 DIAGNOSIS — R0602 Shortness of breath: Secondary | ICD-10-CM

## 2024-08-28 DIAGNOSIS — R600 Localized edema: Secondary | ICD-10-CM

## 2024-08-28 DIAGNOSIS — E7800 Pure hypercholesterolemia, unspecified: Secondary | ICD-10-CM

## 2024-08-28 LAB — LIPID PANEL

## 2024-08-28 NOTE — Patient Instructions (Addendum)
 Medication Instructions:  Continue same medications  Lab Work: Bmet,cbc,lipid panel   Testing/Procedures: Chest CT    schedule soon  Follow-Up: At [facility], you and your health needs are our priority.  As part of our continuing mission to provide you with exceptional heart care, our providers are all part of one team.  This team includes your primary Cardiologist (physician) and Advanced Practice Providers or APPs (Physician Assistants and Nurse Practitioners) who all work together to provide you with the care you need, when you need it.  Your next appointment:  To Be Determined    Provider:  Dr.Jordan   We recommend signing up for the patient portal called MyChart.  Sign up information is provided on this After Visit Summary.  MyChart is used to connect with patients for Virtual Visits (Telemedicine).  Patients are able to view lab/test results, encounter notes, upcoming appointments, etc.  Non-urgent messages can be sent to your provider as well.   To learn more about what you can do with MyChart, go to [email or web address].

## 2024-08-29 ENCOUNTER — Ambulatory Visit: Payer: Self-pay | Admitting: Cardiology

## 2024-08-29 LAB — BASIC METABOLIC PANEL WITH GFR
BUN/Creatinine Ratio: 13 (ref 12–28)
BUN: 12 mg/dL (ref 8–27)
CO2: 23 mmol/L (ref 20–29)
Calcium: 10.2 mg/dL (ref 8.7–10.3)
Chloride: 100 mmol/L (ref 96–106)
Creatinine, Ser: 0.89 mg/dL (ref 0.57–1.00)
Glucose: 104 mg/dL — AB (ref 70–99)
Potassium: 4.5 mmol/L (ref 3.5–5.2)
Sodium: 139 mmol/L (ref 134–144)
eGFR: 72 mL/min/1.73 (ref >59)

## 2024-08-29 LAB — LIPID PANEL
Cholesterol, Total: 191 mg/dL (ref 100–199)
HDL: 55 mg/dL (ref >39)
LDL CALC COMMENT:: 3.5 ratio (ref 0.0–4.4)
LDL Chol Calc (NIH): 108 mg/dL — AB (ref 0–99)
Triglycerides: 163 mg/dL — AB (ref 0–149)
VLDL Cholesterol Cal: 28 mg/dL (ref 5–40)

## 2024-08-29 LAB — CBC WITH DIFFERENTIAL/PLATELET
Basophils Absolute: 0.1 x10E3/uL (ref 0.0–0.2)
Basos: 1 %
EOS (ABSOLUTE): 0 x10E3/uL (ref 0.0–0.4)
Eos: 1 %
Hematocrit: 43.9 % (ref 34.0–46.6)
Hemoglobin: 14.5 g/dL (ref 11.1–15.9)
Immature Grans (Abs): 0 x10E3/uL (ref 0.0–0.1)
Immature Granulocytes: 0 %
Lymphocytes Absolute: 2.9 x10E3/uL (ref 0.7–3.1)
Lymphs: 36 %
MCH: 32 pg (ref 26.6–33.0)
MCHC: 33 g/dL (ref 31.5–35.7)
MCV: 97 fL (ref 79–97)
Monocytes Absolute: 0.6 x10E3/uL (ref 0.1–0.9)
Monocytes: 7 %
Neutrophils Absolute: 4.5 x10E3/uL (ref 1.4–7.0)
Neutrophils: 55 %
Platelets: 366 x10E3/uL (ref 150–450)
RBC: 4.53 x10E6/uL (ref 3.77–5.28)
RDW: 12.2 % (ref 11.7–15.4)
WBC: 8.1 x10E3/uL (ref 3.4–10.8)

## 2024-09-01 ENCOUNTER — Ambulatory Visit (HOSPITAL_BASED_OUTPATIENT_CLINIC_OR_DEPARTMENT_OTHER)
Admission: RE | Admit: 2024-09-01 | Discharge: 2024-09-01 | Disposition: A | Source: Ambulatory Visit | Attending: Cardiology | Admitting: Cardiology

## 2024-09-01 DIAGNOSIS — R0602 Shortness of breath: Secondary | ICD-10-CM | POA: Insufficient documentation

## 2024-09-01 DIAGNOSIS — R0600 Dyspnea, unspecified: Secondary | ICD-10-CM | POA: Diagnosis not present

## 2024-09-01 DIAGNOSIS — J984 Other disorders of lung: Secondary | ICD-10-CM | POA: Diagnosis not present

## 2024-09-01 MED ORDER — IOHEXOL 350 MG/ML SOLN
75.0000 mL | Freq: Once | INTRAVENOUS | Status: AC | PRN
  Administered 2024-09-01: 75 mL via INTRAVENOUS

## 2024-09-02 ENCOUNTER — Telehealth: Payer: Self-pay

## 2024-09-02 NOTE — Telephone Encounter (Signed)
  [place] A DEPT OF [place]. [facility] AT [address] A DEPT OF THE [place]. [facility] [address] [place] [address] Dept: [phone] Loc: [phone]  [name]  09/02/2024  You are scheduled for a Cardiac Catheterization on Tuesday, November 18 with Dr. Peter Jordan.  1. Please arrive at the [facility] (Main Entrance A) at [facility]: [address] [place], [place] [address] at 7:00 AM (This time is 2 hour(s) before your procedure to ensure your preparation).   Free valet parking service is available. You will check in at ADMITTING. The support person will be asked to wait in the waiting room.  It is OK to have someone drop you off and come back when you are ready to be discharged.    Special note: Every effort is made to have your procedure done on time. Please understand that emergencies sometimes delay scheduled procedures.  2. Diet: Nothing to eat after midnight.   3. Hydration: You need to be well hydrated before your procedure. On November 18, you may drink approved liquids (see below) until 2 hours before the procedure, with 16 oz of water as your last intake.   List of approved liquids water, clear juice, clear tea, black coffee, fruit juices, non-citric and without pulp, carbonated beverages, Gatorade, Kool -Aid, plain Jello-O and plain ice popsicles.  4. Labs: Already had done  5. Medication instructions in preparation for your procedure:         On the morning of your procedure, take your Aspirin 81 mg and any morning medicines NOT listed above.  You may use sips of water.  6. Plan to go home the same day, you will only stay overnight if medically necessary. 7. Bring a current list of your medications and current insurance cards. 8. You MUST have a responsible person to drive you home. 9. Someone MUST be with you the first 24 hours after you arrive home or your discharge will be delayed. 10.  Please wear clothes that are easy to get on and off and wear slip-on shoes.  Thank you for allowing us  to care for you!   -- [facility]

## 2024-09-03 ENCOUNTER — Other Ambulatory Visit: Payer: Self-pay | Admitting: Cardiology

## 2024-09-03 DIAGNOSIS — R0609 Other forms of dyspnea: Secondary | ICD-10-CM

## 2024-09-05 ENCOUNTER — Telehealth: Payer: Self-pay | Admitting: *Deleted

## 2024-09-05 NOTE — Telephone Encounter (Signed)
 Cardiac Catheterization scheduled at [facility] for: Tuesday September 10, 2024 9 AM Arrival time [facility] Main Entrance A at: 7 AM  Diet: -Nothing to eat after midnight.  Hydration: -May drink clear liquids until 2 hours before the procedure.  Approved liquids: Water, clear tea, black coffee, fruit juices-non-citric and without pulp,Gatorade, plain Jello/popsicles.   -Please drink 16 oz of water 2 hours before procedure.  Medication instructions: -Usual morning medications can be taken including aspirin 81 mg.  Plan to go home the same day, you will only stay overnight if medically necessary.  You must have responsible adult to drive you home.  Someone must be with you the first 24 hours after you arrive home.  Reviewed procedure instructions with patient.

## 2024-09-10 ENCOUNTER — Other Ambulatory Visit: Payer: Self-pay

## 2024-09-10 ENCOUNTER — Encounter (HOSPITAL_COMMUNITY)
Admission: RE | Disposition: A | Discharge status: Home / Self Care | Payer: Self-pay | Source: Home / Self Care | Attending: Cardiology

## 2024-09-10 ENCOUNTER — Ambulatory Visit (HOSPITAL_COMMUNITY)
Admission: RE | Admit: 2024-09-10 | Discharge: 2024-09-10 | Disposition: A | Discharge status: Home / Self Care | Attending: Cardiology | Admitting: Cardiology

## 2024-09-10 DIAGNOSIS — R600 Localized edema: Secondary | ICD-10-CM | POA: Insufficient documentation

## 2024-09-10 DIAGNOSIS — R0602 Shortness of breath: Secondary | ICD-10-CM | POA: Diagnosis not present

## 2024-09-10 DIAGNOSIS — G4733 Obstructive sleep apnea (adult) (pediatric): Secondary | ICD-10-CM | POA: Diagnosis not present

## 2024-09-10 DIAGNOSIS — E7800 Pure hypercholesterolemia, unspecified: Secondary | ICD-10-CM | POA: Insufficient documentation

## 2024-09-10 DIAGNOSIS — R0609 Other forms of dyspnea: Secondary | ICD-10-CM | POA: Diagnosis not present

## 2024-09-10 DIAGNOSIS — Z79899 Other long term (current) drug therapy: Secondary | ICD-10-CM | POA: Insufficient documentation

## 2024-09-10 DIAGNOSIS — F319 Bipolar disorder, unspecified: Secondary | ICD-10-CM | POA: Insufficient documentation

## 2024-09-10 DIAGNOSIS — I2510 Atherosclerotic heart disease of native coronary artery without angina pectoris: Secondary | ICD-10-CM | POA: Insufficient documentation

## 2024-09-10 HISTORY — PX: RIGHT/LEFT HEART CATH AND CORONARY ANGIOGRAPHY: CATH118266

## 2024-09-10 LAB — POCT I-STAT 7, (LYTES, BLD GAS, ICA,H+H)
Acid-base deficit: 1 mmol/L (ref 0.0–2.0)
Bicarbonate: 22.3 mmol/L (ref 20.0–28.0)
Calcium, Ion: 1.24 mmol/L (ref 1.15–1.40)
HCT: 37 % (ref 36.0–46.0)
Hemoglobin: 12.6 g/dL (ref 12.0–15.0)
O2 Saturation: 97 %
Potassium: 3.6 mmol/L (ref 3.5–5.1)
Sodium: 141 mmol/L (ref 135–145)
TCO2: 23 mmol/L (ref 22–32)
pCO2 arterial: 32.4 mmHg (ref 32–48)
pH, Arterial: 7.445 (ref 7.35–7.45)
pO2, Arterial: 88 mmHg (ref 83–108)

## 2024-09-10 LAB — POCT I-STAT EG7
Acid-Base Excess: 0 mmol/L (ref 0.0–2.0)
Bicarbonate: 24.3 mmol/L (ref 20.0–28.0)
Calcium, Ion: 1.25 mmol/L (ref 1.15–1.40)
HCT: 37 % (ref 36.0–46.0)
Hemoglobin: 12.6 g/dL (ref 12.0–15.0)
O2 Saturation: 71 %
Potassium: 3.6 mmol/L (ref 3.5–5.1)
Sodium: 140 mmol/L (ref 135–145)
TCO2: 25 mmol/L (ref 22–32)
pCO2, Ven: 39.1 mmHg — ABNORMAL LOW (ref 44–60)
pH, Ven: 7.401 (ref 7.25–7.43)
pO2, Ven: 37 mmHg (ref 32–45)

## 2024-09-10 SURGERY — RIGHT/LEFT HEART CATH AND CORONARY ANGIOGRAPHY
Anesthesia: LOCAL

## 2024-09-10 MED ORDER — SODIUM CHLORIDE 0.9% FLUSH: 3.0000 mL | INTRAVENOUS | Status: DC | PRN

## 2024-09-10 MED ORDER — SODIUM CHLORIDE 0.9% FLUSH: 3.0000 mL | Freq: Two times a day (BID) | INTRAVENOUS | Status: DC

## 2024-09-10 MED ORDER — IOHEXOL 350 MG/ML SOLN
INTRAVENOUS | Status: DC | PRN
  Administered 2024-09-10: 30 mL via INTRA_ARTERIAL

## 2024-09-10 MED ORDER — ASPIRIN 81 MG PO CHEW: 81.0000 mg | CHEWABLE_TABLET | ORAL | Status: DC

## 2024-09-10 MED ORDER — FENTANYL CITRATE (PF) 100 MCG/2ML IJ SOLN
INTRAMUSCULAR | Status: AC
  Filled 2024-09-10: qty 2

## 2024-09-10 MED ORDER — HEPARIN (PORCINE) IN NACL 1000-0.9 UT/500ML-% IV SOLN
INTRAVENOUS | Status: DC | PRN
  Administered 2024-09-10: 1000 mL via INTRA_ARTERIAL

## 2024-09-10 MED ORDER — SODIUM CHLORIDE 0.9 % IV SOLN: 250.0000 mL | INTRAVENOUS | Status: DC | PRN

## 2024-09-10 MED ORDER — HYDRALAZINE HCL 20 MG/ML IJ SOLN: 10.0000 mg | INTRAMUSCULAR | Status: DC | PRN

## 2024-09-10 MED ORDER — FENTANYL CITRATE (PF) 100 MCG/2ML IJ SOLN
INTRAMUSCULAR | Status: DC | PRN
  Administered 2024-09-10: 25 ug via INTRAVENOUS

## 2024-09-10 MED ORDER — ONDANSETRON HCL 4 MG/2ML IJ SOLN: 4.0000 mg | Freq: Four times a day (QID) | INTRAMUSCULAR | Status: DC | PRN

## 2024-09-10 MED ORDER — HEPARIN SODIUM (PORCINE) 1000 UNIT/ML IJ SOLN
INTRAMUSCULAR | Status: AC
  Filled 2024-09-10: qty 10

## 2024-09-10 MED ORDER — MIDAZOLAM HCL 2 MG/2ML IJ SOLN
INTRAMUSCULAR | Status: AC
  Filled 2024-09-10: qty 2

## 2024-09-10 MED ORDER — FREE WATER: 500.0000 mL | Freq: Once | Status: DC

## 2024-09-10 MED ORDER — LIDOCAINE HCL (PF) 1 % IJ SOLN
INTRAMUSCULAR | Status: AC
  Filled 2024-09-10: qty 30

## 2024-09-10 MED ORDER — ACETAMINOPHEN 325 MG PO TABS: 650.0000 mg | ORAL_TABLET | ORAL | Status: DC | PRN

## 2024-09-10 MED ORDER — LIDOCAINE HCL (PF) 1 % IJ SOLN
INTRAMUSCULAR | Status: DC | PRN
  Administered 2024-09-10: 5 mL

## 2024-09-10 MED ORDER — VERAPAMIL HCL 2.5 MG/ML IV SOLN
INTRAVENOUS | Status: AC
  Filled 2024-09-10: qty 2

## 2024-09-10 MED ORDER — MIDAZOLAM HCL (PF) 2 MG/2ML IJ SOLN
INTRAMUSCULAR | Status: DC | PRN
  Administered 2024-09-10: 2 mg via INTRAVENOUS

## 2024-09-10 MED ORDER — HEPARIN SODIUM (PORCINE) 1000 UNIT/ML IJ SOLN
INTRAMUSCULAR | Status: DC | PRN
  Administered 2024-09-10: 3500 [IU] via INTRAVENOUS

## 2024-09-10 MED ORDER — VERAPAMIL HCL 2.5 MG/ML IV SOLN
INTRAVENOUS | Status: DC | PRN
  Administered 2024-09-10: 10 mL via INTRA_ARTERIAL

## 2024-09-10 SURGICAL SUPPLY — 12 items
CATH BALLN WEDGE 5F 110CM (CATHETERS) IMPLANT
CATH INFINITI 5 FR JL3.5 (CATHETERS) IMPLANT
CATH INFINITI JR4 5F (CATHETERS) IMPLANT
DEVICE RAD TR BAND REGULAR (VASCULAR PRODUCTS) IMPLANT
GLIDESHEATH SLEND SS 6F .021 (SHEATH) IMPLANT
GUIDEWIRE .025 260CM (WIRE) IMPLANT
GUIDEWIRE INQWIRE 1.5J.035X260 (WIRE) IMPLANT
KIT SYRINGE INJ CVI SPIKEX1 (MISCELLANEOUS) IMPLANT
PACK CARDIAC CATHETERIZATION (CUSTOM PROCEDURE TRAY) ×1 IMPLANT
SET ATX-X65L (MISCELLANEOUS) IMPLANT
SHEATH GLIDE SLENDER 4/5FR (SHEATH) IMPLANT
SHEATH PROBE COVER 6X72 (BAG) IMPLANT

## 2024-09-10 NOTE — Interval H&P Note (Signed)
 History and Physical Interval Note:  09/10/2024 8:49 AM  [name]  has presented today for surgery, with the diagnosis of shortness of breath.  The various methods of treatment have been discussed with the patient and family. After consideration of risks, benefits and other options for treatment, the patient has consented to  Procedure(s): RIGHT/LEFT HEART CATH AND CORONARY ANGIOGRAPHY (N/A) as a surgical intervention.  The patient's history has been reviewed, patient examined, no change in status, stable for surgery.  I have reviewed the patient's chart and labs.  Questions were answered to the patient's satisfaction.    Cath Lab Visit (complete for each Cath Lab visit)  Clinical Evaluation Leading to the Procedure:   ACS: No.  Non-ACS:    Anginal Classification: CCS III  Anti-ischemic medical therapy: No Therapy  Non-Invasive Test Results: No non-invasive testing performed  Prior CABG: No previous CABG       [facility] 09/10/2024 8:49 AM

## 2024-09-10 NOTE — Discharge Instructions (Signed)
Brachial Site Care   This sheet gives you information about how to care for yourself after your procedure. Your health care provider may also give you more specific instructions. If you have problems or questions, contact your health care provider. What can I expect after the procedure? After the procedure, it is common to have: Bruising and tenderness at the catheter insertion area. Follow these instructions at home:  Insertion site care Follow instructions from your health care provider about how to take care of your insertion site. Make sure you: Wash your hands with soap and water before you change your bandage (dressing). If soap and water are not available, use hand sanitizer. Remove your dressing as told by your health care provider. In 24 hours Check your insertion site every day for signs of infection. Check for: Redness, swelling, or pain. Pus or a bad smell. Warmth. You may shower 24-48 hours after the procedure. Do not apply powder or lotion to the site.  Activity For 24 hours after the procedure, or as directed by your health care provider: Do not push or pull heavy objects with the affected arm. Do not drive yourself home from the hospital or clinic. You may drive 24 hours after the procedure unless your health care provider tells you not to. Do not lift anything that is heavier than 10 lb (4.5 kg), or the limit that you are told, until your health care provider says that it is safe.  For 24 hours    Radial Site Care  This sheet gives you information about how to care for yourself after your procedure. Your health care provider may also give you more specific instructions. If you have problems or questions, contact your health care provider. What can I expect after the procedure? After the procedure, it is common to have: Bruising and tenderness at the catheter insertion area. Follow these instructions at home: Medicines Take over-the-counter and prescription  medicines only as told by your health care provider. Insertion site care Follow instructions from your health care provider about how to take care of your insertion site. Make sure you: Wash your hands with soap and water before you remove your bandage (dressing). If soap and water are not available, use hand sanitizer. May remove dressing in 24 hours. Check your insertion site every day for signs of infection. Check for: Redness, swelling, or pain. Fluid or blood. Pus or a bad smell. Warmth. Do no take baths, swim, or use a hot tub for 5 days. You may shower 24-48 hours after the procedure. Remove the dressing and gently wash the site with plain soap and water. Pat the area dry with a clean towel. Do not rub the site. That could cause bleeding. Do not apply powder or lotion to the site. Activity  For 24 hours after the procedure, or as directed by your health care provider: Do not flex or bend the affected arm. Do not push or pull heavy objects with the affected arm. Do not drive yourself home from the hospital or clinic. You may drive 24 hours after the procedure. Do not operate machinery or power tools. KEEP ARM ELEVATED THE REMAINDER OF THE DAY. Do not push, pull or lift anything that is heavier than 10 lb for 5 days. Ask your health care provider when it is okay to: Return to work or school. Resume usual physical activities or sports. Resume sexual activity. General instructions If the catheter site starts to bleed, raise your arm and put firm pressure  on the site. If the bleeding does not stop, get help right away. This is a medical emergency. DRINK PLENTY OF FLUIDS FOR THE NEXT 2-3 DAYS. No alcohol consumption for 24 hours after receiving sedation. If you went home on the same day as your procedure, a responsible adult should be with you for the first 24 hours after you arrive home. Keep all follow-up visits as told by your health care provider. This is important. Contact a  health care provider if: You have a fever. You have redness, swelling, or yellow drainage around your insertion site. Get help right away if: You have unusual pain at the radial site. The catheter insertion area swells very fast. The insertion area is bleeding, and the bleeding does not stop when you hold steady pressure on the area. Your arm or hand becomes pale, cool, tingly, or numb. These symptoms may represent a serious problem that is an emergency. Do not wait to see if the symptoms will go away. Get medical help right away. Call your local emergency services (911 in the [place] Do not drive yourself to the hospital. Summary After the procedure, it is common to have bruising and tenderness at the site. Follow instructions from your health care provider about how to take care of your radial site wound. Check the wound every day for signs of infection.  This information is not intended to replace advice given to you by your health care provider. Make sure you discuss any questions you have with your health care provider. Document Revised: 11/15/2017 Document Reviewed: 11/15/2017 Elsevier Patient Education  2020 [employer].

## 2024-09-11 ENCOUNTER — Encounter: Payer: Self-pay | Admitting: Cardiology

## 2024-09-11 ENCOUNTER — Encounter (HOSPITAL_COMMUNITY): Payer: Self-pay | Admitting: Cardiology

## 2024-09-11 LAB — POCT I-STAT EG7
Acid-base deficit: 1 mmol/L (ref 0.0–2.0)
Bicarbonate: 23.2 mmol/L (ref 20.0–28.0)
Calcium, Ion: 1.21 mmol/L (ref 1.15–1.40)
HCT: 36 % (ref 36.0–46.0)
Hemoglobin: 12.2 g/dL (ref 12.0–15.0)
O2 Saturation: 72 %
Potassium: 3.5 mmol/L (ref 3.5–5.1)
Sodium: 141 mmol/L (ref 135–145)
TCO2: 24 mmol/L (ref 22–32)
pCO2, Ven: 37.4 mmHg — ABNORMAL LOW (ref 44–60)
pH, Ven: 7.401 (ref 7.25–7.43)
pO2, Ven: 37 mmHg (ref 32–45)

## 2024-10-07 NOTE — Progress Notes (Unsigned)
 Cardiology Office Note:    Date:  10/09/2024   ID:  [name], DOB Jul 21, 1961, MRN [id number]  PCP:  [name], MD   [name] Providers Cardiologist:  None     Referring MD: [name], MD   No chief complaint on file.   History of Present Illness:    [name] is a 63 y.o. female seen for follow up of SOB and edema. She has a history of obesity with OSA on CPAP, HLD and bipolar disorder. She is a retired Insurance Underwriter.  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 She had never had this before. Was placed on hydrochlorothiazide but this did not help and resulted in hyponatremia and hypokalemia. Reports BNP done and was normal. CXR was normal. Denies any injury or extended travel. States her chest feels sore. She did have an L3 compression fracture in September and had a lot of N/V with this. She feels like in October her SOB and edema were worse. Also notes significant fatigue. Husband states just taking a shower now she gets out of breath and has to sit down.  Echo was done has was unremarkable.   She underwent cardiac cath showing no significant CAD, normal right and left heart pressures.    On follow up today she is feeling much better. Her SOB  has resolved and energy level is better. Mild edema at times.   Past Medical History:  Diagnosis Date   Atherosclerosis of aorta    Bipolar 1 disorder (HCC)    Depression    Esophageal stenosis    Esophagitis    GAD (generalized anxiety disorder)    GERD (gastroesophageal reflux disease)    Herpes simplex    Hyperlipidemia    Hypomania (HCC)    Insomnia    OSA (obstructive sleep apnea)    CPAP dependent   Osteoporosis    PPD positive    Pre-diabetes    Tremor     Past Surgical History:  Procedure Laterality Date   ABDOMINAL HYSTERECTOMY     CATARACT EXTRACTION, BILATERAL     due to seroquel    RIGHT/LEFT HEART CATH AND CORONARY ANGIOGRAPHY  N/A 09/10/2024   Procedure: RIGHT/LEFT HEART CATH AND CORONARY ANGIOGRAPHY;  Surgeon: Jaleia Hanke M, MD;  Location: [facility];  Service: Cardiovascular;  Laterality: N/A;   ROBOTIC ASSISTED LAPAROSCOPIC HYSTERECTOMY AND SALPINGECTOMY      Current Medications: Current Meds  Medication Sig   alendronate (FOSAMAX) 70 MG tablet TAKE 1 TABLET BY MOUTH ONCE WEEKLY 30 MINUTES BEFORE FIRST FOOD, MEDICINE, OR WATER  OF THE DAY (Patient taking differently: Take 70 mg by mouth once a week.)   amantadine  (SYMMETREL ) 100 MG capsule Take 1 capsule (100 mg total) by mouth 2 (two) times daily. (Patient taking differently: Take 100 mg by mouth daily.)   buPROPion  (WELLBUTRIN ) 75 MG tablet Take 1 tablet (75 mg total) by mouth in the morning.   hydrOXYzine  (ATARAX ) 25 MG tablet Take 1-2 tablets (25-50 mg total) by mouth at bedtime as needed. For sleep, anxiety   levothyroxine (SYNTHROID) 25 MCG tablet Take 25 mcg by mouth daily before breakfast.   lithium  carbonate (LITHOBID ) 300 MG ER tablet Take 1 tablet (300 mg total) by mouth daily with lunch. DOSE REDUCTION   Multiple Vitamin (MULTIVITAMIN) tablet Take 1 tablet by mouth daily.   Probiotic Product (PROBIOTIC GUMMIES PO) Take 2 each by mouth daily at 12  noon.   simvastatin (ZOCOR) 40 MG tablet Take 40 mg by mouth every evening.   valACYclovir (VALTREX) 500 MG tablet 1 tablet Orally Twice a day as needed for outbreak (Patient taking differently: Take 500 mg by mouth as needed (for outbreaks).)     Allergies:   Tyloxapol, Prilosec [omeprazole], Propranolol , and Tylox [oxycodone-acetaminophen ]   Social History   Socioeconomic History   Marital status: Married    Spouse name: eric   Number of children: 2   Years of education: Not on file   Highest education level: Bachelor's degree (e.g., BA, AB, BS)  Occupational History    Comment: unemployed  Tobacco Use   Smoking status: Never   Smokeless tobacco: Never  Vaping Use   Vaping status:  Never Used  Substance and Sexual Activity   Alcohol use: No   Drug use: No   Sexual activity: Yes  Other Topics Concern   Not on file  Social History Narrative   Right handed   Dosnt work    Social Drivers of Health   Tobacco Use: Low Risk (10/09/2024)   Patient History    Smoking Tobacco Use: Never    Smokeless Tobacco Use: Never    Passive Exposure: Not on file  Financial Resource Strain: Not on file  Food Insecurity: Not on file  Transportation Needs: Not on file  Physical Activity: Not on file  Stress: Not on file  Social Connections: Unknown (03/07/2022)   Received from [facility]   Social Network    Social Network: Not on file  Depression (PHQ2-9): Medium Risk (05/15/2024)   Depression (PHQ2-9)    PHQ-2 Score: 8  Alcohol Screen: Not on file  Housing: Not on file  Utilities: Not on file  Health Literacy: Not on file     Family History: The patient's family history includes AAA (abdominal aortic aneurysm) in her father; Alcohol abuse in her maternal uncle; Bipolar disorder in her cousin; Hypertension in her father; Personality disorder in her mother; Schizophrenia in an other family member; Stroke in her father; Tremor in her maternal grandfather and mother. There is no history of Breast cancer.  ROS:   Please see the history of present illness.    All other systems reviewed and are negative.  EKGs/Labs/Other Studies Reviewed:    The following studies were reviewed today:        Echo 08/15/24: IMPRESSIONS     1. Left ventricular ejection fraction, by estimation, is 50 to 55%. Left  ventricular ejection fraction by 3D volume is 50 %. The left ventricle has  low normal function. The left ventricle demonstrates regional wall motion  abnormalities (see scoring  diagram/findings for description). Left ventricular diastolic parameters  were normal. The average left ventricular global longitudinal strain is  -17.3 %. The global longitudinal strain is abnormal.    2. Right ventricular systolic function is normal. The right ventricular  size is mildly enlarged. Tricuspid regurgitation signal is inadequate for  assessing PA pressure.   3. The mitral valve is normal in structure. Mild mitral valve  regurgitation. No evidence of mitral stenosis.   4. The aortic valve is tricuspid. Aortic valve regurgitation is not  visualized. No aortic stenosis is present.   5. The inferior vena cava is normal in size with greater than 50%  respiratory variability, suggesting right atrial pressure of 3 mmHg.   Comparison(s): No prior Echocardiogram.   Cardiac cath 09/10/24:  RIGHT/LEFT HEART CATH AND CORONARY ANGIOGRAPHY   Conclusion  Prox LAD lesion is 20% stenosed.   The left ventricular systolic function is normal.   LV end diastolic pressure is normal.   The left ventricular ejection fraction is 55-65% by visual estimate.   Minimal nonobstructive CAD. 20% proximal LAD. Otherwise normal Normal LV filling pressures. LVEDP 14 mm Hg. PCWP 16/15, mean 14 mm Hg Normal right heart pressures. PAP 35/15, mean 23 mm Hg. RA pressure 13/11, mean 10 mm Hg Normal cardiac output 4.6 L/min, index 2.7   Plan: no cardiac cause for symptoms seen.      Recent Labs: 02/05/2024: ALT 26 06/30/2024: TSH 2.521 08/28/2024: BUN 12; Creatinine, Ser 0.89; Platelets 366 09/10/2024: Hemoglobin 12.6; Potassium 3.6; Sodium 141  Recent Lipid Panel    Component Value Date/Time   CHOL 191 08/28/2024 1231   TRIG 163 (H) 08/28/2024 1231   HDL 55 08/28/2024 1231   CHOLHDL 3.5 08/28/2024 1231   LDLCALC 108 (H) 08/28/2024 1231     Risk Assessment/Calculations:           Physical Exam:    VS:  BP (!) 143/82   Pulse 78   Ht 5' 5 (1.651 m)   Wt 144 lb (65.3 kg)   SpO2 98%   BMI 23.96 kg/m     Wt Readings from Last 3 Encounters:  10/09/24 144 lb (65.3 kg)  09/10/24 142 lb (64.4 kg)  08/28/24 142 lb (64.4 kg)     GEN:  Well nourished, well developed in no acute  distress2 HEENT: Normal NECK: No JVD; No carotid bruits LYMPHATICS: No lymphadenopathy CARDIAC: RRR, no murmurs, rubs, gallops RESPIRATORY:  Clear to auscultation without rales, wheezing or rhonchi  ABDOMEN: Soft, non-tender, non-distended MUSCULOSKELETAL:  trace  edema; No deformity  SKIN: Warm and dry NEUROLOGIC:  Alert and oriented x 3 PSYCHIATRIC:  Normal affect   ASSESSMENT:    1. Dyspnea on exertion   2. Edema, unspecified type     PLAN:    In order of problems listed above:  Progressive DOE and LE edema. Labs including blood counts, thyroid , LFTs, renal function and UA ok. BNP normal. CXR clear. Echo shows mildly reduced EF with regional wall motion abnormality raising concern for ischemic heart disease. CT negative for PE. On cardiac cath she had normal LV function, normal coronaries and normal right and left heart pressures. Fortunately symptoms have markedly improved. Can follow up PRN. HLD. On simvastatin. LDL 128.  No significant CAD OSA on CPAP Bipolar disorder.            Medication Adjustments/Labs and Tests Ordered: Current medicines are reviewed at length with the patient today.  Concerns regarding medicines are outlined above.  No orders of the defined types were placed in this encounter.  No orders of the defined types were placed in this encounter.   There are no Patient Instructions on file for this visit.   Signed, Shailee Foots, MD  10/09/2024 3:39 PM    [facility]

## 2024-10-09 ENCOUNTER — Ambulatory Visit: Attending: Cardiology | Admitting: Cardiology

## 2024-10-09 ENCOUNTER — Encounter: Payer: Self-pay | Admitting: Cardiology

## 2024-10-09 VITALS — BP 143/82 | HR 78 | Ht 65.0 in | Wt 144.0 lb

## 2024-10-09 DIAGNOSIS — R0609 Other forms of dyspnea: Secondary | ICD-10-CM

## 2024-10-09 DIAGNOSIS — R609 Edema, unspecified: Secondary | ICD-10-CM

## 2024-10-09 NOTE — Patient Instructions (Signed)
 Medication Instructions:  Continue same medications  Lab Work: None ordered  Testing/Procedures: None ordered  Follow-Up: At [facility], you and your health needs are our priority.  As part of our continuing mission to provide you with exceptional heart care, our providers are all part of one team.  This team includes your primary Cardiologist (physician) and Advanced Practice Providers or APPs (Physician Assistants and Nurse Practitioners) who all work together to provide you with the care you need, when you need it.  Your next appointment:  As needed    Provider:  Dr.Jordan  We recommend signing up for the patient portal called MyChart.  Sign up information is provided on this After Visit Summary.  MyChart is used to connect with patients for Virtual Visits (Telemedicine).  Patients are able to view lab/test results, encounter notes, upcoming appointments, etc.  Non-urgent messages can be sent to your provider as well.   To learn more about what you can do with MyChart, go to [email or web address].

## 2024-10-21 ENCOUNTER — Encounter: Payer: Self-pay | Admitting: Psychiatry

## 2024-10-21 ENCOUNTER — Telehealth: Admitting: Psychiatry

## 2024-10-21 DIAGNOSIS — G4733 Obstructive sleep apnea (adult) (pediatric): Secondary | ICD-10-CM | POA: Diagnosis not present

## 2024-10-21 DIAGNOSIS — F3176 Bipolar disorder, in full remission, most recent episode depressed: Secondary | ICD-10-CM

## 2024-10-21 DIAGNOSIS — R251 Tremor, unspecified: Secondary | ICD-10-CM

## 2024-10-21 DIAGNOSIS — Z79899 Other long term (current) drug therapy: Secondary | ICD-10-CM

## 2024-10-21 DIAGNOSIS — F411 Generalized anxiety disorder: Secondary | ICD-10-CM | POA: Diagnosis not present

## 2024-10-21 MED ORDER — HYDROXYZINE HCL 25 MG PO TABS: 25.0000 mg | ORAL_TABLET | Freq: Every evening | ORAL | 3 refills | Status: AC | PRN

## 2024-10-21 MED ORDER — BUSPIRONE HCL 10 MG PO TABS: 10.0000 mg | ORAL_TABLET | Freq: Two times a day (BID) | ORAL | 1 refills | Status: DC

## 2024-10-21 NOTE — Patient Instructions (Signed)
 Buspirone  Tablets What is this medication? BUSPIRONE  (byoo SPYE rone) treats anxiety. It works by balancing the levels of dopamine and serotonin in your brain, substances that help regulate mood. This medicine may be used for other purposes; ask your health care provider or pharmacist if you have questions. COMMON BRAND NAME(S): BuSpar , Buspar  Dividose What should I tell my care team before I take this medication? They need to know if you have any of these conditions: Kidney disease Liver disease An unusual or allergic reaction to buspirone , other medications, foods, dyes, or preservatives Pregnant or trying to get pregnant Breastfeeding How should I use this medication? Take this medication by mouth with water . Take it as directed on the prescription label. You can take it with or without food. You should always take it the same way. Keep taking it unless your care team tells you to stop. Do not take this medication with foods or drinks that contain grapefruit. Talk to your care team about the use of this medication in children. Special care may be needed. Overdosage: If you think you have taken too much of this medicine contact a poison control center or emergency room at once. NOTE: This medicine is only for you. Do not share this medicine with others. What if I miss a dose? If you miss a dose, take it as soon as you can. If it is almost time for your next dose, take only that dose. Do not take double or extra doses. What may interact with this medication? Do not take this medication with any of the following: Linezolid MAOIs like Carbex, Eldepryl, Marplan, Nardil, and Parnate Methylene blue Procarbazine This medication may also interact with the following: Alcohol Diazepam Digoxin Droperidol Grapefruit juice Haloperidol Metoclopramide Opioids Phenothiazines, such as chlorpromazine, prochlorperazine, thioridazine Some medications for depression, anxiety, or other mental health  conditions Some medication for migraines, such as sumatriptan Stimulant medications for ADHD, weight loss, or staying awake Supplements, such as St. John's wort or tryptophan Tetrabenazine Other medications may affect the way this medication works. Talk with your care team about all the medications you take. They may suggest changes to your treatment plan to lower the risk of side effects and to make sure your medications work as intended. This list may not describe all possible interactions. Give your health care provider a list of all the medicines, herbs, non-prescription drugs, or dietary supplements you use. Also tell them if you smoke, drink alcohol, or use illegal drugs. Some items may interact with your medicine. What should I watch for while using this medication? Visit your care team for regular checks on your progress. It may take 1 to 2 weeks before your anxiety gets better. This medication may affect your coordination, reaction time, or judgment. Do not drive or operate machinery until you know how this medication affects you. Sit up or stand slowly to reduce the risk of dizzy or fainting spells. Drinking alcohol with this medication can increase the risk of these side effects. Serotonin syndrome is when your body has too much serotonin in it. This happens when this medication is used with other ones that increase serotonin levels. Common medications that increase serotonin levels are antidepressants, some medications for migraines, and some antibiotics. The symptoms of serotonin syndrome include irritability, confusion, fast or irregular heartbeat, muscle stiffness, twitching muscles, sweating, high fever, seizure, chills, vomiting and diarrhea. Contact your care team right away if you think you have serotonin syndrome. Talk to your care team about this medication if  you are breastfeeding. There are benefits and risks to taking medications while breastfeeding. Your care team can help you  find the option that works for you. What side effects may I notice from receiving this medication? Side effects that you should report to your care team as soon as possible: Allergic reactions--skin rash, itching, hives, swelling of the face, lips, tongue, or throat Irritability, confusion, fast or irregular heartbeat, muscle stiffness, twitching muscles, sweating, high fever, seizure, chills, vomiting, diarrhea, which may be signs of serotonin syndrome Side effects that usually do not require medical attention (report to your care team if they continue or are bothersome): Anxiety, nervousness Dizziness Drowsiness Headache Nausea Trouble sleeping This list may not describe all possible side effects. Call your doctor for medical advice about side effects. You may report side effects to FDA at 1-800-FDA-1088. Where should I keep my medication? Keep out of reach of children and pets. Store at room temperature between 20 and 25 degrees C (68 and 77 degrees F). Get rid of any unused medication after the expiration date. To get rid of medications that are no longer needed or expired: Take the medication to a take-back program. Check with your pharmacy or law enforcement to find a location. If you cannot return the medication, check the label or package insert to see if the medication should be thrown out in the garbage or flushed down the toilet. If you are not sure, ask your care team. If it is safe to put it in the trash, empty the medication out of the container. Mix it with cat litter, dirt, coffee grounds, or another unwanted substance. Seal the mixture in a bag or container. Put it in the trash. NOTE: This sheet is a summary. It may not cover all possible information. If you have questions about this medicine, talk to your doctor, pharmacist, or health care provider.  2025 Elsevier/Gold Standard (2024-03-19 00:00:00)

## 2024-10-21 NOTE — Progress Notes (Unsigned)
 Virtual Visit via Video Note  I connected with [name] on 10/21/2024 at  1:30 PM EST by a video enabled telemedicine application and verified that I am speaking with the correct person using two identifiers.  Location Provider Location : [facility] Patient Location : Home  Participants: Patient , Provider    I discussed the limitations of evaluation and management by telemedicine and the availability of in person appointments. The patient expressed understanding and agreed to proceed.   I discussed the assessment and treatment plan with the patient. The patient was provided an opportunity to ask questions and all were answered. The patient agreed with the plan and demonstrated an understanding of the instructions.   The patient was advised to call back or seek an in-person evaluation if the symptoms worsen or if the condition fails to improve as anticipated. [facility] MD OP Progress Note  10/21/2024 1:50 PM [name]  MRN:  [id number]  Chief Complaint:  Chief Complaint  Patient presents with   Follow-up   Anxiety   Depression   Medication Refill   Discussed the use of AI scribe software for clinical note transcription with the patient, who gave verbal consent to proceed.  History of Present Illness [name] is a 63 year old Caucasian female on disability, married, currently lives in [place], has a history of bipolar disorder, GAD, primary insomnia, OSA on CPAP, hyperlipidemia, osteoporosis, history of cataract surgery was evaluated by telemedicine today for a follow-up appointment.  She describes increased irritability and agitation, and she reports days when she feels as though she is going to come out of her skin. She reports that these symptoms have worsened over the past few months, with particular exacerbation during the holiday season.  Ongoing anxiety prompts her to use hydroxyzine  nightly and occasionally during the day to help settle herself  and manage anxiety symptoms. She notes that hydroxyzine  does not alleviate the sensation of crawling out of her skin but helps her calm down and sometimes assists with sleep. Her current medications include lithium  300 mg ER, Wellbutrin  75 mg, and hydroxyzine  as needed.She also takes amantadine  once daily, reporting improvement in tremors and no longer experiencing significant shakes.  She reports that her sleep quality remains good, with her typically going to bed between 7:30 and 11:00 PM and sometimes sleeping up to 10 hours, especially when feeling tired. She manages to keep up with holiday demands and family gatherings, though she finds these events somewhat stressful.  She reports a diminished sense of taste however she has been eating sufficiently.  She denies any suicidality, homicidality or perceptual disturbances.    Visit Diagnosis:    ICD-10-CM   1. Bipolar disorder, in full remission, most recent episode depressed  F31.76 Lithium  level    BUN    2. GAD (generalized anxiety disorder)  F41.1 Lithium  level    BUN    busPIRone (BUSPAR) 10 MG tablet    3. Tremor  R25.1    Parkinsonian secondary to medications as well as functional    4. High risk medication use  Z79.899 Lithium  level    BUN    Creatinine, serum             Past Psychiatric History: I have reviewed past psychiatric history from progress note on 09/01/2021 as well as 03/21/2024.  Past Medical History:  Past Medical History:  Diagnosis Date   Atherosclerosis of aorta    Bipolar 1 disorder (HCC)    Depression  Esophageal stenosis    Esophagitis    GAD (generalized anxiety disorder)    GERD (gastroesophageal reflux disease)    Herpes simplex    Hyperlipidemia    Hypomania (HCC)    Insomnia    OSA (obstructive sleep apnea)    CPAP dependent   Osteoporosis    PPD positive    Pre-diabetes    Tremor     Past Surgical History:  Procedure Laterality Date   ABDOMINAL HYSTERECTOMY     CATARACT  EXTRACTION, BILATERAL     due to seroquel    RIGHT/LEFT HEART CATH AND CORONARY ANGIOGRAPHY N/A 09/10/2024   Procedure: RIGHT/LEFT HEART CATH AND CORONARY ANGIOGRAPHY;  Surgeon: Jordan, Peter M, MD;  Location: [facility];  Service: Cardiovascular;  Laterality: N/A;   ROBOTIC ASSISTED LAPAROSCOPIC HYSTERECTOMY AND SALPINGECTOMY      Family Psychiatric History: I have reviewed family psychiatric history from progress note on 09/01/2021.  Family History:  Family History  Problem Relation Age of Onset   Personality disorder Mother    Tremor Mother    Hypertension Father    Stroke Father    AAA (abdominal aortic aneurysm) Father    Alcohol abuse Maternal Uncle    Tremor Maternal Grandfather    Bipolar disorder Cousin    Schizophrenia Other    Breast cancer Neg Hx     Social History: I have reviewed social history from progress note on 09/01/2021. Social History   Socioeconomic History   Marital status: Married    Spouse name: eric   Number of children: 2   Years of education: Not on file   Highest education level: Bachelor's degree (e.g., BA, AB, BS)  Occupational History    Comment: unemployed  Tobacco Use   Smoking status: Never   Smokeless tobacco: Never  Vaping Use   Vaping status: Never Used  Substance and Sexual Activity   Alcohol use: No   Drug use: No   Sexual activity: Yes  Other Topics Concern   Not on file  Social History Narrative   Right handed   Dosnt work    Social Drivers of Health   Tobacco Use: Low Risk (10/21/2024)   Patient History    Smoking Tobacco Use: Never    Smokeless Tobacco Use: Never    Passive Exposure: Not on file  Financial Resource Strain: Not on file  Food Insecurity: Not on file  Transportation Needs: Not on file  Physical Activity: Not on file  Stress: Not on file  Social Connections: Unknown (03/07/2022)   [facility]   Social Network    Social Network: Not on file  Depression (PHQ2-9): Medium Risk  (05/15/2024)   Depression (PHQ2-9)    PHQ-2 Score: 8  Alcohol Screen: Not on file  Housing: Not on file  Utilities: Not on file  Health Literacy: Not on file    Allergies: Allergies[1]  Metabolic Disorder Labs: No results found for: HGBA1C, MPG Lab Results  Component Value Date   PROLACTIN 5.1 09/01/2021   Lab Results  Component Value Date   CHOL 191 08/28/2024   TRIG 163 (H) 08/28/2024   HDL 55 08/28/2024   CHOLHDL 3.5 08/28/2024   LDLCALC 108 (H) 08/28/2024   Lab Results  Component Value Date   TSH 2.521 06/30/2024   TSH 3.652 12/06/2023    Therapeutic Level Labs: Lab Results  Component Value Date   LITHIUM  0.45 (L) 07/29/2024   LITHIUM  1.05 06/30/2024   Lab Results  Component  Value Date   VALPROATE 89 05/24/2023   VALPROATE 69 01/17/2023   No results found for: CBMZ  Current Medications: Current Outpatient Medications  Medication Sig Dispense Refill   busPIRone (BUSPAR) 10 MG tablet Take 1 tablet (10 mg total) by mouth 2 (two) times daily. 60 tablet 1   alendronate (FOSAMAX) 70 MG tablet TAKE 1 TABLET BY MOUTH ONCE WEEKLY 30 MINUTES BEFORE FIRST FOOD, MEDICINE, OR WATER  OF THE DAY (Patient taking differently: Take 70 mg by mouth once a week.)     amantadine  (SYMMETREL ) 100 MG capsule Take 1 capsule (100 mg total) by mouth 2 (two) times daily. (Patient taking differently: Take 100 mg by mouth daily.) 60 capsule 4   buPROPion  (WELLBUTRIN ) 75 MG tablet Take 1 tablet (75 mg total) by mouth in the morning. 90 tablet 1   hydrOXYzine  (ATARAX ) 25 MG tablet Take 1-2 tablets (25-50 mg total) by mouth at bedtime as needed. For sleep, anxiety 180 tablet 3   levothyroxine (SYNTHROID) 25 MCG tablet Take 25 mcg by mouth daily before breakfast.     lithium  carbonate (LITHOBID ) 300 MG ER tablet Take 1 tablet (300 mg total) by mouth daily with lunch. DOSE REDUCTION 30 tablet 4   Multiple Vitamin (MULTIVITAMIN) tablet Take 1 tablet by mouth daily.     Probiotic Product  (PROBIOTIC GUMMIES PO) Take 2 each by mouth daily at 12 noon.     simvastatin (ZOCOR) 40 MG tablet Take 40 mg by mouth every evening.     valACYclovir (VALTREX) 500 MG tablet 1 tablet Orally Twice a day as needed for outbreak (Patient taking differently: Take 500 mg by mouth as needed (for outbreaks).)     No current facility-administered medications for this visit.     Musculoskeletal: Strength & Muscle Tone: UTA Gait & Station: Seated Patient leans: N/A  Psychiatric Specialty Exam: Review of Systems  Psychiatric/Behavioral:  The patient is nervous/anxious.     There were no vitals taken for this visit.There is no height or weight on file to calculate BMI.  General Appearance: Casual  Eye Contact:  Fair  Speech:  Clear and Coherent  Volume:  Normal  Mood:  Anxious  Affect:  Congruent  Thought Process:  Goal Directed and Descriptions of Associations: Intact  Orientation:  Full (Time, Place, and Person)  Thought Content: Logical   Suicidal Thoughts:  No  Homicidal Thoughts:  No  Memory:  Immediate;   Fair Recent;   Fair Remote;   Fair  Judgement:  Fair  Insight:  Fair  Psychomotor Activity:  Normal  Concentration:  Concentration: Fair and Attention Span: Fair  Recall:  [employer] of Knowledge: Fair  Language: Fair  Akathisia:  No  Handed:  Right  AIMS (if indicated): not done  Assets:  Manufacturing Systems Engineer Desire for Improvement Housing Social Support Transportation  ADL's:  Intact  Cognition: WNL  Sleep:  Fair   Screenings: Midwife Visit from 05/15/2024 in [facility] Office Visit from 05/15/2023 in [facility] Office Visit from 02/01/2023 in [facility] Office Visit from 01/10/2023 in [facility] Office Visit from 12/22/2022 in [facility]   AIMS Total Score 0 0 0 0 0   GAD-7    Flowsheet Row Office Visit from 05/15/2024 in [facility] Office Visit from 01/15/2024 in [facility] Office Visit from 05/15/2023 in [facility] Office Visit from 03/13/2023 in [facility] Office Visit from 02/01/2023 in [facility]  Total GAD-7 Score 15 11 13 16 7    PHQ2-9    Flowsheet Row Office Visit from 05/15/2024 in [facility] Office Visit from 01/15/2024 in [facility] Video Visit from 01/04/2024 in [facility] Video Visit from 07/25/2023 in [facility] Office Visit from 05/15/2023 in [facility]  PHQ-2 Total Score 2 3 4 2 6   PHQ-9 Total Score 8 14 16 11 17    Flowsheet Row Video Visit from 10/21/2024 in [facility] Admission (Discharged) from 09/10/2024 in [facility] CARDIAC CATH LAB Video Visit from 08/20/2024 in [facility]  C-SSRS RISK CATEGORY No Risk No Risk No Risk     Assessment and Plan: [name] is a 63 year old Caucasian female who presented for a follow-up appointment, discussed assessment and plan as noted below.  1. Bipolar disorder, in full remission, most recent episode depressed Currently denies any significant depression symptoms Continue Lithium  300 mg daily at reduced dosage (lithium  level-07/29/2024-0.45-subtherapeutic) Continue Wellbutrin  75 mg daily  2. GAD (generalized anxiety disorder)-unstable Currently reports ongoing anxiety symptoms likely due to situational stressors Start BuSpar 10 mg twice daily Continue CBT  with Dr. Jestine: Continue Hydroxyzine  25 mg up to 2 tablets as needed  3. Tremor-improved Reports tremors have improved on the current medication regimen Continue Amantadine  100 mg twice daily Follow-up with neurology as needed  4. High risk medication use Will order lithium  level, BUN, creatinine.  Patient to go to [facility] lab.  Follow-up Follow-up in clinic in 6 to 8 weeks or sooner in person.   Consent: Patient/Guardian gives verbal consent for treatment and assignment of benefits for services provided during this visit. Patient/Guardian expressed understanding and agreed to proceed.  This note was generated in part or whole with voice recognition software. Voice recognition is usually quite accurate but there are transcription errors that can and very often do occur. I apologize for any typographical errors that were not detected and corrected.     Zenaida Tesar, MD 10/22/2024, 8:57 AM     [1]  Allergies Allergen Reactions   Tyloxapol Rash   Prilosec [Omeprazole]     Interacts with psych meds   Propranolol  Nausea And Vomiting and Other (See Comments)    Dropped heart rate to 30   Tylox [Oxycodone-Acetaminophen ] Rash

## 2024-11-15 ENCOUNTER — Other Ambulatory Visit
Admission: RE | Admit: 2024-11-15 | Discharge: 2024-11-15 | Disposition: A | Attending: Psychiatry | Admitting: Psychiatry

## 2024-11-15 ENCOUNTER — Ambulatory Visit: Payer: Self-pay | Admitting: Psychiatry

## 2024-11-15 DIAGNOSIS — Z79899 Other long term (current) drug therapy: Secondary | ICD-10-CM | POA: Insufficient documentation

## 2024-11-15 DIAGNOSIS — F411 Generalized anxiety disorder: Secondary | ICD-10-CM | POA: Insufficient documentation

## 2024-11-15 DIAGNOSIS — F3176 Bipolar disorder, in full remission, most recent episode depressed: Secondary | ICD-10-CM | POA: Diagnosis present

## 2024-11-15 LAB — CREATININE, SERUM
Creatinine, Ser: 0.76 mg/dL (ref 0.44–1.00)
GFR, Estimated: >60 mL/min

## 2024-11-15 LAB — BUN: BUN: 12 mg/dL (ref 8–23)

## 2024-11-15 LAB — LITHIUM LEVEL: Lithium Lvl: 0.35 mmol/L — ABNORMAL LOW (ref 0.60–1.20)

## 2024-11-28 ENCOUNTER — Encounter: Payer: Self-pay | Admitting: Psychiatry

## 2024-11-28 ENCOUNTER — Other Ambulatory Visit: Payer: Self-pay

## 2024-11-28 ENCOUNTER — Ambulatory Visit: Admitting: Psychiatry

## 2024-11-28 VITALS — BP 141/86 | HR 69 | Temp 97.4°F | Ht 65.0 in | Wt 144.8 lb

## 2024-11-28 DIAGNOSIS — F411 Generalized anxiety disorder: Secondary | ICD-10-CM

## 2024-11-28 DIAGNOSIS — R251 Tremor, unspecified: Secondary | ICD-10-CM

## 2024-11-28 DIAGNOSIS — F3176 Bipolar disorder, in full remission, most recent episode depressed: Secondary | ICD-10-CM

## 2024-11-28 MED ORDER — ALPRAZOLAM 0.25 MG PO TABS: 0.2500 mg | ORAL_TABLET | Freq: Every day | ORAL | 0 refills | Status: AC | PRN

## 2024-11-28 NOTE — Progress Notes (Unsigned)
 [facility] MD OP Progress Note  11/28/2024 2:34 PM [name]  MRN:  [id number]  Chief Complaint:  Chief Complaint  Patient presents with   Follow-up   Anxiety   Depression   Medication Refill   Discussed the use of AI scribe software for clinical note transcription with the patient, who gave verbal consent to proceed.  History of Present Illness [name] is a 64 year old Caucasian female on disability, married, currently lives in [place], has a history of bipolar disorder, GAD, primary insomnia, OSA on CPAP, hyperlipidemia, osteoporosis, history of cataract surgery was evaluated in office today.    She reports improvement in her mood symptoms although she continues to have increased irritability and episodic impulsivity and overconfidence which can last up to a whole day.  Increased activity, including cleaning and baking, occurs on some days, and during these times, she feels irritable and quick to anger.  She has been able to take a step back and work on coping strategies relaxation techniques learned in therapy during these episodes and that has been beneficial.  She and her therapist are currently working on the same.  She does not believe this has affected her day-to-day functioning or interpersonal relationships.  For anxiety, she identifies persistent worry based in fear and describes episodes where her mind races and she feels overwhelmed, sometimes lasting almost all day. Triggers include trying to do too many things at once or thinking about too many things. She uses coping strategies learned in therapy, such as stepping away from the situation and practicing breathing exercises, and finds these helpful. She takes hydroxyzine  2 to 3 times daily over the past month for anxiety.  She denies depressive symptoms, stating she feels tired but not depressed. Sleep quality remains good, with recent nights of over 9 to 11 hours of sleep, and she states that sleep is not an  issue. She uses a CPAP machine and reports sleeping well, including going to bed early and sleeping through the night on several occasions.  Current medications include lithium  300 mg at lunchtime, bupropion  (Wellbutrin ), and hydroxyzine  as needed, which she has been taking 2 to 3 times daily over the last month. She stopped buspirone  (Buspar ) after she experienced significant gastrointestinal side effects and she was unable to tolerate it even at a reduced dose. She also discontinued amantadine , as she no longer experiences tremors.   She denies any suicidality, homicidality or perceptual disturbances.    Visit Diagnosis:    ICD-10-CM   1. Bipolar disorder, in full remission, most recent episode depressed  F31.76     2. GAD (generalized anxiety disorder)  F41.1 ALPRAZolam  (XANAX ) 0.25 MG tablet    3. Tremor  R25.1    Parkinsonian secondary to medications as well as functional      Past Psychiatric History: I have reviewed past psychiatric history from progress note on 09/01/2021 as well as 03/21/2024.  Past Medical History:  Past Medical History:  Diagnosis Date   Atherosclerosis of aorta    Bipolar 1 disorder (HCC)    Depression    Esophageal stenosis    Esophagitis    GAD (generalized anxiety disorder)    GERD (gastroesophageal reflux disease)    Herpes simplex    Hyperlipidemia    Hypomania (HCC)    Insomnia    OSA (obstructive sleep apnea)    CPAP dependent   Osteoporosis    PPD positive    Pre-diabetes    Tremor     Past Surgical  History:  Procedure Laterality Date   ABDOMINAL HYSTERECTOMY     CATARACT EXTRACTION, BILATERAL     due to seroquel    RIGHT/LEFT HEART CATH AND CORONARY ANGIOGRAPHY N/A 09/10/2024   Procedure: RIGHT/LEFT HEART CATH AND CORONARY ANGIOGRAPHY;  Surgeon: Jordan, Peter M, MD;  Location: [facility] INVASIVE CV LAB;  Service: Cardiovascular;  Laterality: N/A;   ROBOTIC ASSISTED LAPAROSCOPIC HYSTERECTOMY AND SALPINGECTOMY      Family Psychiatric  History: I have reviewed family psychiatric history from progress note on 09/01/2021.  Family History:  Family History  Problem Relation Age of Onset   Personality disorder Mother    Tremor Mother    Hypertension Father    Stroke Father    AAA (abdominal aortic aneurysm) Father    Alcohol abuse Maternal Uncle    Tremor Maternal Grandfather    Bipolar disorder Cousin    Schizophrenia Other    Breast cancer Neg Hx     Social History: I have reviewed social history from progress note on 09/01/2021. Social History   Socioeconomic History   Marital status: Married    Spouse name: eric   Number of children: 2   Years of education: Not on file   Highest education level: Bachelor's degree (e.g., BA, AB, BS)  Occupational History    Comment: unemployed  Tobacco Use   Smoking status: Never   Smokeless tobacco: Never  Vaping Use   Vaping status: Never Used  Substance and Sexual Activity   Alcohol use: No   Drug use: No   Sexual activity: Yes  Other Topics Concern   Not on file  Social History Narrative   Right handed   Dosnt work    Social Drivers of Health   Tobacco Use: Low Risk (11/28/2024)   Patient History    Smoking Tobacco Use: Never    Smokeless Tobacco Use: Never    Passive Exposure: Not on file  Financial Resource Strain: Not on file  Food Insecurity: Not on file  Transportation Needs: Not on file  Physical Activity: Not on file  Stress: Not on file  Social Connections: Unknown (03/07/2022)   Received from [facility]   Social Network    Social Network: Not on file  Depression (PHQ2-9): Low Risk (11/28/2024)   Depression (PHQ2-9)    PHQ-2 Score: 0  Alcohol Screen: Not on file  Housing: Not on file  Utilities: Not on file  Health Literacy: Not on file    Allergies: Allergies[1]  Metabolic Disorder Labs: No results found for: HGBA1C, MPG Lab Results  Component Value Date   PROLACTIN 5.1 09/01/2021   Lab Results  Component Value Date   CHOL 191  08/28/2024   TRIG 163 (H) 08/28/2024   HDL 55 08/28/2024   CHOLHDL 3.5 08/28/2024   LDLCALC 108 (H) 08/28/2024   Lab Results  Component Value Date   TSH 2.521 06/30/2024   TSH 3.652 12/06/2023    Therapeutic Level Labs: Lab Results  Component Value Date   LITHIUM  0.35 (L) 11/15/2024   LITHIUM  0.45 (L) 07/29/2024   Lab Results  Component Value Date   VALPROATE 89 05/24/2023   VALPROATE 69 01/17/2023   No results found for: CBMZ  Current Medications: Current Outpatient Medications  Medication Sig Dispense Refill   ALPRAZolam  (XANAX ) 0.25 MG tablet Take 1 tablet (0.25 mg total) by mouth daily as needed for anxiety. 5 pills must last 30 days 5 tablet 0   alendronate (FOSAMAX) 70 MG tablet TAKE 1 TABLET BY MOUTH  ONCE WEEKLY 30 MINUTES BEFORE FIRST FOOD, MEDICINE, OR WATER  OF THE DAY (Patient taking differently: Take 70 mg by mouth once a week.)     buPROPion  (WELLBUTRIN ) 75 MG tablet Take 1 tablet (75 mg total) by mouth in the morning. 90 tablet 1   hydrOXYzine  (ATARAX ) 25 MG tablet Take 1-2 tablets (25-50 mg total) by mouth at bedtime as needed. For sleep, anxiety 180 tablet 3   levothyroxine (SYNTHROID) 25 MCG tablet Take 25 mcg by mouth daily before breakfast.     lithium  carbonate (LITHOBID ) 300 MG ER tablet Take 1 tablet (300 mg total) by mouth daily with lunch. DOSE REDUCTION 30 tablet 4   Multiple Vitamin (MULTIVITAMIN) tablet Take 1 tablet by mouth daily.     Probiotic Product (PROBIOTIC GUMMIES PO) Take 2 each by mouth daily at 12 noon.     simvastatin (ZOCOR) 40 MG tablet Take 40 mg by mouth every evening.     valACYclovir (VALTREX) 500 MG tablet 1 tablet Orally Twice a day as needed for outbreak (Patient taking differently: Take 500 mg by mouth as needed (for outbreaks).)     No current facility-administered medications for this visit.     Musculoskeletal: Strength & Muscle Tone: within normal limits Gait & Station: normal Patient leans: N/A  Psychiatric  Specialty Exam: Review of Systems  Psychiatric/Behavioral:  The patient is nervous/anxious.     Blood pressure (!) 141/86, pulse 69, temperature (!) 97.4 F (36.3 C), temperature source Temporal, height 5' 5 (1.651 m), weight 144 lb 12.8 oz (65.7 kg).Body mass index is 24.1 kg/m.  General Appearance: Casual  Eye Contact:  Fair  Speech:  Clear and Coherent  Volume:  Normal  Mood:  Anxious  Affect:  Full Range  Thought Process:  Goal Directed and Descriptions of Associations: Intact  Orientation:  Full (Time, Place, and Person)  Thought Content: Logical   Suicidal Thoughts:  No  Homicidal Thoughts:  No  Memory:  Immediate;   Fair Recent;   Fair Remote;   Fair  Judgement:  Fair  Insight:  Fair  Psychomotor Activity:  Normal  Concentration:  Concentration: Fair and Attention Span: Fair  Recall:  [employer] of Knowledge: Fair  Language: Fair  Akathisia:  No  Handed:  Right  AIMS (if indicated): done  Assets:  Communication Skills Desire for Improvement Housing Social Support Transportation  ADL's:  Intact  Cognition: WNL  Sleep:  Fair   Screenings: Midwife Visit from 05/15/2024 in [facility] Office Visit from 05/15/2023 in [facility] Office Visit from 02/01/2023 in [facility] Office Visit from 01/10/2023 in [facility] Office Visit from 12/22/2022 in [facility]  AIMS Total Score 0 0 0 0 0   GAD-7    Flowsheet Row Office Visit from 11/28/2024 in [facility] Office Visit from 05/15/2024 in [facility] Office Visit from 01/15/2024 in [facility] Office Visit from 05/15/2023 in [facility] Office  Visit from 03/13/2023 in [facility]  Total GAD-7 Score 10 15 11 13 16    PHQ2-9    Flowsheet Row Office Visit from 11/28/2024 in [facility] Office Visit from 05/15/2024 in [facility] Office Visit from 01/15/2024 in  [facility] Video Visit from 01/04/2024 in [facility] Video Visit from 07/25/2023 in [facility]  PHQ-2 Total Score 0 2 3 4 2   PHQ-9 Total Score -- 8 14 16 11    Flowsheet Row Office Visit from 11/28/2024 in [facility] Video Visit from 10/21/2024 in [facility] Admission (Discharged) from 09/10/2024 in [facility] CARDIAC CATH LAB  C-SSRS RISK CATEGORY No Risk No Risk No Risk     Assessment and Plan: Camya G Beachem is a 64 year old Caucasian female who presented for a follow-up appointment, discussed assessment and plan as noted below.  1. Bipolar disorder, in full remission, most recent episode depressed Currently does report episodic mood swings impulsivity although it does not affect her day-to-day functioning or relationships. Continue psychotherapy sessions with Dr. Guerry Continue Lithium  300 mg daily at reduced dosage (lithium  level reviewed and discussed dated 11/15/2024-0.35-subtherapeutic, BUN-within normal limits) Continue Wellbutrin  75 mg daily  2. GAD (generalized anxiety disorder)-improving Does have episodic anxiety symptoms although overall improved. Start Xanax  0.25 mg for significant anxiety attacks only, patient to limit use Reviewed [place] PMP AWARxE Continue Hydroxyzine  25 mg up to 2 tablets daily as needed Continue CBT with Dr. Guerry  3. Tremor-resolved Currently denies any significant tremors. Discontinue Amantadine   for noncompliance.  Patient with elevated blood pressure reading in session today encouraged to follow-up with primary care provider.  I have reviewed notes per Dr. Guerry dated 11/21/2024.  Follow-up Follow-up in clinic in 3 months or sooner if needed.    Consent: Patient/Guardian gives verbal consent for treatment and assignment of benefits for services provided during this visit. Patient/Guardian expressed understanding and agreed to proceed.   This note was generated in part or whole with voice recognition software. Voice recognition is usually quite accurate but there are transcription errors that can and very often do occur. I apologize for any typographical errors that were not detected and corrected.    Jeramie Scogin, MD 11/29/2024, 8:07 AM     [1]  Allergies Allergen Reactions   Tyloxapol Rash   Prilosec [Omeprazole]     Interacts with psych meds   Propranolol  Nausea And Vomiting and Other (See Comments)    Dropped heart rate to 30   Tylox Diocletian.dials ] Rash

## 2024-11-28 NOTE — Patient Instructions (Signed)
 Alprazolam  Tablets What is this medication? ALPRAZOLAM  (al PRAY zoe lam) treats anxiety. It works by education administrator system calm down. It belongs to a group of medications called benzodiazepines. This medicine may be used for other purposes; ask your health care provider or pharmacist if you have questions. COMMON BRAND NAME(S): Xanax  What should I tell my care team before I take this medication? They need to know if you have any of these conditions: Glaucoma Kidney disease Liver disease Lung disease, such as asthma or COPD Mental health conditions Substance use disorder Suicidal thoughts, plans, or attempt by you or a family member An unusual or allergic reaction to alprazolam , other medications, foods, dyes, or preservatives Pregnant or trying to get pregnant Breastfeeding How should I use this medication? Take this medication by mouth. Take it as directed on the prescription label. Do not take it more often than directed. Keep taking it unless your care team tells you to stop. A special MedGuide will be given to you by the pharmacist with each prescription and refill. Be sure to read this information carefully each time. Talk to your care team about the use of this medication in children. Special care may be needed. People 65 years and older may have a stronger reaction and need a smaller dose. Overdosage: If you think you have taken too much of this medicine contact a poison control center or emergency room at once. NOTE: This medicine is only for you. Do not share this medicine with others. What if I miss a dose? If you miss a dose, take it as soon as you can. If it is almost time for your next dose, take only that dose. Do not take double or extra doses. What may interact with this medication? Do not take this medication with any of the following: Chloramphenicol Clarithromycin Grapefruit juice Levoketoconazole Lonafarnib Mifepristone Sodium oxybate Some antivirals for  HIV or hepatitis Some medications for cancer, such as adagrasib, ceritinib, idelalisib, ribociclib, tucatinib Some medications for fungal infections, such as ketoconazole, itraconazole, posaconazole, voriconazole This medication may also interact with the following: Alcohol Cimetidine Digoxin Medications that cause drowsiness before a procedure, such as propofol Medications that help you fall asleep Medications that relax muscles Opioids for pain or cough Other benzodiazepines Phenothiazines, such as chlorpromazine, prochlorperazine , thioridazine Some antihistamines Some medications for depression, such as amitriptyline or trazodone Some medications for seizures, such as phenobarbital or primidone Supplements, such as green tea, melatonin, St. John's wort, valerian Other medications may affect the way this medication works. Talk with your care team about all the medications you take. They may suggest changes to your treatment plan to lower the risk of side effects and to make sure your medications work as intended. This list may not describe all possible interactions. Give your health care provider a list of all the medicines, herbs, non-prescription drugs, or dietary supplements you use. Also tell them if you smoke, drink alcohol, or use illegal drugs. Some items may interact with your medicine. What should I watch for while using this medication? Visit your care team for regular checks on your progress. Tell your care team if your symptoms do not start to get better or if they get worse. There is a risk of abuse, misuse, and addiction with this medication. It is important to take this medication as directed by your care team. This medication may affect your coordination, reaction time, or judgment. Do not drive or operate machinery until you know how this medication affects you.  Sit up or stand slowly to reduce the risk of dizzy or fainting spells. Drinking alcohol with this medication can  increase the risk of these side effects. This medication is a CNS depressant. This is a type of medication or substance that slows down your brain and nervous system. Taking it with other CNS depressants can make you too sleepy. This can make it hard to breathe and stay awake. In some cases, it can cause coma and death. CNS depressants include opioids, benzodiazepines, muscle relaxants, medications for sleep, alcohol, and street drugs. Talk to your care team about all the medications, vitamins, and supplements you take. They can tell you what is safe to take together. Call emergency services right away if you have slow or shallow breathing, feel dizzy or confused, or have trouble staying awake. Do not stop taking this medication or reduce your dose without first talking to your care team. If you have taken this medication for a long time or take a high dose, your body may rely on it. Stopping it suddenly may cause a severe reaction. Talk to your care team about how long you need to take this medication. When it is time to stop, the dose will be slowly lowered over time to reduce the risk of side effects. This medication may worsen depression and cause thoughts of suicide. This can happen at any time but is more common after first starting treatment and after a change in dose. Talk to your care team right away if you have changes in mood and behavior or thoughts of self-harm or suicide. They can help you. Talk to your care team if you may be pregnant. Prolonged use of this medication during pregnancy can cause temporary withdrawal in a newborn. Talk to your care team before breastfeeding. Changes to your treatment plan may be needed. If you breastfeed while taking this medication, seek medical care right away if you notice the child has slow or noisy breathing, is unusually sleepy or not able to wake up, or is limp. What side effects may I notice from receiving this medication? Side effects that you should  report to your care team as soon as possible: Allergic reactions--skin rash, itching, hives, swelling of the face, lips, tongue, or throat CNS depression--slow or shallow breathing, shortness of breath, feeling faint, dizziness, confusion, trouble staying awake Thoughts of suicide or self-harm, worsening mood, feelings of depression Side effects that usually do not require medical attention (report these to your care team if they continue or are bothersome): Change in sex drive or performance Dizziness Drowsiness Loss of balance or coordination This list may not describe all possible side effects. Call your doctor for medical advice about side effects. You may report side effects to FDA at 1-800-FDA-1088. Where should I keep my medication? Keep this medication out of reach of children and pets. Store it out of sight in a safe place. Do not share it with others. Misuse of this medication is dangerous and against the law. Store at room temperature between 20 and 25 degrees C (68 and 77 degrees F). Get rid of any unused medication after the expiration date. This medication may cause harm and death if it is taken by other adults, children, or pets. It is important to get rid of the medication as soon as you no longer need it or it is expired. To get rid of this medication: Take the medication to a take-back program. Check with your pharmacy or law enforcement to find a location.  Follow the steps given to you by your pharmacy. You may be given a pre-paid mail-back envelope or disposal product to safely get rid of your medication. If other options are not available, check the package insert or medication guide to see if it should be flushed down the toilet or put in your trash at home. If you are not sure, ask your care team. If it is safe to put it in your trash, empty the medication out of the container. Mix it with cat litter, dirt, used coffee grounds, or another unwanted substance. Seal the mixture in  a container, such as a plastic bag. Put it in the trash. NOTE: This sheet is a summary. It may not cover all possible information. If you have questions about this medicine, talk to your doctor, pharmacist, or health care provider.  2025 Elsevier/Gold Standard (2024-01-19 00:00:00)

## 2024-12-12 ENCOUNTER — Ambulatory Visit: Admitting: Psychiatry

## 2025-01-22 ENCOUNTER — Telehealth: Admitting: Psychiatry

## 2025-02-25 ENCOUNTER — Telehealth: Admitting: Psychiatry
